# Patient Record
Sex: Male | Born: 1940
Health system: Southern US, Community
[De-identification: ages and names within clinical notes are randomized; demographics above are authoritative.]

## PROBLEM LIST (undated history)

## (undated) DIAGNOSIS — J45909 Unspecified asthma, uncomplicated: Secondary | ICD-10-CM

## (undated) DIAGNOSIS — I10 Essential (primary) hypertension: Secondary | ICD-10-CM

## (undated) DIAGNOSIS — E785 Hyperlipidemia, unspecified: Secondary | ICD-10-CM

## (undated) DIAGNOSIS — T7840XA Allergy, unspecified, initial encounter: Secondary | ICD-10-CM

## (undated) DIAGNOSIS — K219 Gastro-esophageal reflux disease without esophagitis: Secondary | ICD-10-CM

## (undated) DIAGNOSIS — C61 Malignant neoplasm of prostate: Secondary | ICD-10-CM

## (undated) HISTORY — DX: Essential (primary) hypertension: I10

## (undated) HISTORY — DX: Gastro-esophageal reflux disease without esophagitis: K21.9

## (undated) HISTORY — PX: HERNIA REPAIR: SHX51

## (undated) HISTORY — PX: EYE SURGERY: SHX253

## (undated) HISTORY — PX: PROSTATE BIOPSY: SHX241

## (undated) HISTORY — DX: Hyperlipidemia, unspecified: E78.5

## (undated) HISTORY — DX: Unspecified asthma, uncomplicated: J45.909

## (undated) HISTORY — DX: Allergy, unspecified, initial encounter: T78.40XA

---

## 2001-05-15 ENCOUNTER — Inpatient Hospital Stay (HOSPITAL_COMMUNITY): Admission: AD | Admit: 2001-05-15 | Discharge: 2001-05-17 | Payer: Self-pay | Admitting: Internal Medicine

## 2001-11-04 ENCOUNTER — Encounter: Admission: RE | Admit: 2001-11-04 | Discharge: 2001-11-04 | Payer: Self-pay | Admitting: General Surgery

## 2001-11-04 ENCOUNTER — Encounter: Payer: Self-pay | Admitting: General Surgery

## 2001-11-05 ENCOUNTER — Encounter: Admission: RE | Admit: 2001-11-05 | Discharge: 2001-11-05 | Payer: Self-pay | Admitting: General Surgery

## 2001-11-05 ENCOUNTER — Encounter: Payer: Self-pay | Admitting: General Surgery

## 2001-11-06 ENCOUNTER — Ambulatory Visit (HOSPITAL_BASED_OUTPATIENT_CLINIC_OR_DEPARTMENT_OTHER): Admission: RE | Admit: 2001-11-06 | Discharge: 2001-11-06 | Payer: Self-pay | Admitting: General Surgery

## 2012-01-10 DIAGNOSIS — Z23 Encounter for immunization: Secondary | ICD-10-CM | POA: Diagnosis not present

## 2012-02-14 DIAGNOSIS — B9789 Other viral agents as the cause of diseases classified elsewhere: Secondary | ICD-10-CM | POA: Diagnosis not present

## 2012-02-14 DIAGNOSIS — K8020 Calculus of gallbladder without cholecystitis without obstruction: Secondary | ICD-10-CM | POA: Diagnosis not present

## 2012-02-14 DIAGNOSIS — R918 Other nonspecific abnormal finding of lung field: Secondary | ICD-10-CM | POA: Diagnosis not present

## 2012-02-14 DIAGNOSIS — R51 Headache: Secondary | ICD-10-CM | POA: Diagnosis not present

## 2012-02-14 DIAGNOSIS — R509 Fever, unspecified: Secondary | ICD-10-CM | POA: Diagnosis not present

## 2012-02-14 DIAGNOSIS — J984 Other disorders of lung: Secondary | ICD-10-CM | POA: Diagnosis not present

## 2012-02-14 DIAGNOSIS — N200 Calculus of kidney: Secondary | ICD-10-CM | POA: Diagnosis not present

## 2012-02-14 DIAGNOSIS — R091 Pleurisy: Secondary | ICD-10-CM | POA: Diagnosis not present

## 2012-02-14 DIAGNOSIS — R05 Cough: Secondary | ICD-10-CM | POA: Diagnosis not present

## 2012-02-14 DIAGNOSIS — R059 Cough, unspecified: Secondary | ICD-10-CM | POA: Diagnosis not present

## 2012-06-16 DIAGNOSIS — H53019 Deprivation amblyopia, unspecified eye: Secondary | ICD-10-CM | POA: Diagnosis not present

## 2012-06-16 DIAGNOSIS — H2510 Age-related nuclear cataract, unspecified eye: Secondary | ICD-10-CM | POA: Diagnosis not present

## 2012-06-16 DIAGNOSIS — H524 Presbyopia: Secondary | ICD-10-CM | POA: Diagnosis not present

## 2012-07-27 DIAGNOSIS — H43819 Vitreous degeneration, unspecified eye: Secondary | ICD-10-CM | POA: Diagnosis not present

## 2012-10-22 DIAGNOSIS — D485 Neoplasm of uncertain behavior of skin: Secondary | ICD-10-CM | POA: Diagnosis not present

## 2012-10-22 DIAGNOSIS — L570 Actinic keratosis: Secondary | ICD-10-CM | POA: Diagnosis not present

## 2012-10-22 DIAGNOSIS — C44320 Squamous cell carcinoma of skin of unspecified parts of face: Secondary | ICD-10-CM | POA: Diagnosis not present

## 2013-02-23 DIAGNOSIS — Z85828 Personal history of other malignant neoplasm of skin: Secondary | ICD-10-CM | POA: Diagnosis not present

## 2013-02-23 DIAGNOSIS — L570 Actinic keratosis: Secondary | ICD-10-CM | POA: Diagnosis not present

## 2013-07-29 DIAGNOSIS — L219 Seborrheic dermatitis, unspecified: Secondary | ICD-10-CM | POA: Diagnosis not present

## 2013-07-29 DIAGNOSIS — L820 Inflamed seborrheic keratosis: Secondary | ICD-10-CM | POA: Diagnosis not present

## 2013-07-29 DIAGNOSIS — Z85828 Personal history of other malignant neoplasm of skin: Secondary | ICD-10-CM | POA: Diagnosis not present

## 2013-08-19 DIAGNOSIS — D485 Neoplasm of uncertain behavior of skin: Secondary | ICD-10-CM | POA: Diagnosis not present

## 2013-08-19 DIAGNOSIS — L570 Actinic keratosis: Secondary | ICD-10-CM | POA: Diagnosis not present

## 2013-10-27 DIAGNOSIS — Z23 Encounter for immunization: Secondary | ICD-10-CM | POA: Diagnosis not present

## 2014-04-28 DIAGNOSIS — Z85828 Personal history of other malignant neoplasm of skin: Secondary | ICD-10-CM | POA: Diagnosis not present

## 2014-04-28 DIAGNOSIS — L570 Actinic keratosis: Secondary | ICD-10-CM | POA: Diagnosis not present

## 2014-04-28 DIAGNOSIS — D485 Neoplasm of uncertain behavior of skin: Secondary | ICD-10-CM | POA: Diagnosis not present

## 2014-05-18 DIAGNOSIS — H3560 Retinal hemorrhage, unspecified eye: Secondary | ICD-10-CM | POA: Diagnosis not present

## 2014-05-18 DIAGNOSIS — H524 Presbyopia: Secondary | ICD-10-CM | POA: Diagnosis not present

## 2014-05-18 DIAGNOSIS — H53019 Deprivation amblyopia, unspecified eye: Secondary | ICD-10-CM | POA: Diagnosis not present

## 2014-05-18 DIAGNOSIS — H2510 Age-related nuclear cataract, unspecified eye: Secondary | ICD-10-CM | POA: Diagnosis not present

## 2014-09-29 DIAGNOSIS — L821 Other seborrheic keratosis: Secondary | ICD-10-CM | POA: Diagnosis not present

## 2014-09-29 DIAGNOSIS — L570 Actinic keratosis: Secondary | ICD-10-CM | POA: Diagnosis not present

## 2014-09-29 DIAGNOSIS — Z85828 Personal history of other malignant neoplasm of skin: Secondary | ICD-10-CM | POA: Diagnosis not present

## 2014-09-29 DIAGNOSIS — D1801 Hemangioma of skin and subcutaneous tissue: Secondary | ICD-10-CM | POA: Diagnosis not present

## 2014-09-29 DIAGNOSIS — Z08 Encounter for follow-up examination after completed treatment for malignant neoplasm: Secondary | ICD-10-CM | POA: Diagnosis not present

## 2014-10-12 DIAGNOSIS — Z23 Encounter for immunization: Secondary | ICD-10-CM | POA: Diagnosis not present

## 2014-11-02 ENCOUNTER — Ambulatory Visit: Payer: Self-pay | Admitting: Physician Assistant

## 2014-11-07 DIAGNOSIS — H2513 Age-related nuclear cataract, bilateral: Secondary | ICD-10-CM | POA: Diagnosis not present

## 2014-11-07 DIAGNOSIS — H34832 Tributary (branch) retinal vein occlusion, left eye: Secondary | ICD-10-CM | POA: Diagnosis not present

## 2014-11-08 ENCOUNTER — Ambulatory Visit (INDEPENDENT_AMBULATORY_CARE_PROVIDER_SITE_OTHER): Payer: Medicare Other | Admitting: Medical

## 2014-11-08 ENCOUNTER — Encounter: Payer: Self-pay | Admitting: Medical

## 2014-11-08 ENCOUNTER — Encounter: Payer: Self-pay | Admitting: Gastroenterology

## 2014-11-08 VITALS — BP 175/91 | HR 81 | Temp 98.1°F | Ht 67.5 in | Wt 193.6 lb

## 2014-11-08 DIAGNOSIS — J309 Allergic rhinitis, unspecified: Secondary | ICD-10-CM | POA: Insufficient documentation

## 2014-11-08 DIAGNOSIS — K219 Gastro-esophageal reflux disease without esophagitis: Secondary | ICD-10-CM

## 2014-11-08 DIAGNOSIS — J3089 Other allergic rhinitis: Secondary | ICD-10-CM

## 2014-11-08 DIAGNOSIS — Z1211 Encounter for screening for malignant neoplasm of colon: Secondary | ICD-10-CM | POA: Diagnosis not present

## 2014-11-08 DIAGNOSIS — I10 Essential (primary) hypertension: Secondary | ICD-10-CM | POA: Insufficient documentation

## 2014-11-08 DIAGNOSIS — J45909 Unspecified asthma, uncomplicated: Secondary | ICD-10-CM

## 2014-11-08 LAB — COMPREHENSIVE METABOLIC PANEL
ALT: 23 U/L (ref 0–53)
AST: 24 U/L (ref 0–37)
Albumin: 3.4 g/dL — ABNORMAL LOW (ref 3.5–5.2)
Alkaline Phosphatase: 51 U/L (ref 39–117)
BUN: 19 mg/dL (ref 6–23)
CO2: 24 mEq/L (ref 19–32)
Calcium: 9.2 mg/dL (ref 8.4–10.5)
Chloride: 102 mEq/L (ref 96–112)
Creatinine, Ser: 1 mg/dL (ref 0.4–1.5)
GFR: 76.52 mL/min (ref >60.00)
Glucose, Bld: 100 mg/dL — ABNORMAL HIGH (ref 70–99)
Potassium: 4.4 mEq/L (ref 3.5–5.1)
Sodium: 138 mEq/L (ref 135–145)
Total Bilirubin: 0.9 mg/dL (ref 0.2–1.2)
Total Protein: 7.5 g/dL (ref 6.0–8.3)

## 2014-11-08 MED ORDER — LISINOPRIL 10 MG PO TABS: 10.0000 mg | ORAL_TABLET | Freq: Every day | ORAL | Status: DC

## 2014-11-08 NOTE — Patient Instructions (Addendum)
For your htn, I am going to prescribe lisinopril tabs. Follow dash diet, reduce caffeine intake and get some exercise more frequently.  Please get lab today.  Follow up in 2 weeks and come in fasting. Will get lipid panel on follow up.  Pt has had flu-vaccine this year.  Will refer you to GI for colonoscopy.  You could make wellness exam physical in 2-3 months as well.   DASH Eating Plan DASH stands for "Dietary Approaches to Stop Hypertension." The DASH eating plan is a healthy eating plan that has been shown to reduce high blood pressure (hypertension). Additional health benefits may include reducing the risk of type 2 diabetes mellitus, heart disease, and stroke. The DASH eating plan may also help with weight loss. WHAT DO I NEED TO KNOW ABOUT THE DASH EATING PLAN? For the DASH eating plan, you will follow these general guidelines:  Choose foods with a percent daily value for sodium of less than 5% (as listed on the food label).  Use salt-free seasonings or herbs instead of table salt or sea salt.  Check with your health care provider or pharmacist before using salt substitutes.  Eat lower-sodium products, often labeled as "lower sodium" or "no salt added."  Eat fresh foods.  Eat more vegetables, fruits, and low-fat dairy products.  Choose whole grains. Look for the word "whole" as the first word in the ingredient list.  Choose fish and skinless chicken or [place] more often than red meat. Limit fish, poultry, and meat to 6 oz (170 g) each day.  Limit sweets, desserts, sugars, and sugary drinks.  Choose heart-healthy fats.  Limit cheese to 1 oz (28 g) per day.  Eat more home-cooked food and less restaurant, buffet, and fast food.  Limit fried foods.  Cook foods using methods other than frying.  Limit canned vegetables. If you do use them, rinse them well to decrease the sodium.  When eating at a restaurant, ask that your food be prepared with less salt, or no salt if  possible. WHAT FOODS CAN I EAT? Seek help from a dietitian for individual calorie needs. Grains Whole grain or whole wheat bread. Brown rice. Whole grain or whole wheat pasta. Quinoa, bulgur, and whole grain cereals. Low-sodium cereals. Corn or whole wheat flour tortillas. Whole grain cornbread. Whole grain crackers. Low-sodium crackers. Vegetables Fresh or frozen vegetables (raw, steamed, roasted, or grilled). Low-sodium or reduced-sodium tomato and vegetable juices. Low-sodium or reduced-sodium tomato sauce and paste. Low-sodium or reduced-sodium canned vegetables.  Fruits All fresh, canned (in natural juice), or frozen fruits. Meat and Other Protein Products Ground beef (85% or leaner), grass-fed beef, or beef trimmed of fat. Skinless chicken or [place]. Ground chicken or [place]. Pork trimmed of fat. All fish and seafood. Eggs. Dried beans, peas, or lentils. Unsalted nuts and seeds. Unsalted canned beans. Dairy Low-fat dairy products, such as skim or 1% milk, 2% or reduced-fat cheeses, low-fat ricotta or cottage cheese, or plain low-fat yogurt. Low-sodium or reduced-sodium cheeses. Fats and Oils Tub margarines without trans fats. Light or reduced-fat mayonnaise and salad dressings (reduced sodium). Avocado. Safflower, olive, or canola oils. Natural peanut or almond butter. Other Unsalted popcorn and pretzels. The items listed above may not be a complete list of recommended foods or beverages. Contact your dietitian for more options. WHAT FOODS ARE NOT RECOMMENDED? Grains White bread. White pasta. White rice. Refined cornbread. Bagels and croissants. Crackers that contain trans fat. Vegetables Creamed or fried vegetables. Vegetables in a cheese sauce. Regular  canned vegetables. Regular canned tomato sauce and paste. Regular tomato and vegetable juices. Fruits Dried fruits. Canned fruit in light or heavy syrup. Fruit juice. Meat and Other Protein Products Fatty cuts of meat. Ribs, chicken  wings, bacon, sausage, bologna, salami, chitterlings, fatback, hot dogs, bratwurst, and packaged luncheon meats. Salted nuts and seeds. Canned beans with salt. Dairy Whole or 2% milk, cream, half-and-half, and cream cheese. Whole-fat or sweetened yogurt. Full-fat cheeses or blue cheese. Nondairy creamers and whipped toppings. Processed cheese, cheese spreads, or cheese curds. Condiments Onion and garlic salt, seasoned salt, table salt, and sea salt. Canned and packaged gravies. Worcestershire sauce. Tartar sauce. Barbecue sauce. Teriyaki sauce. Soy sauce, including reduced sodium. Steak sauce. Fish sauce. Oyster sauce. Cocktail sauce. Horseradish. Ketchup and mustard. Meat flavorings and tenderizers. Bouillon cubes. Hot sauce. Tabasco sauce. Marinades. Taco seasonings. Relishes. Fats and Oils Butter, stick margarine, lard, shortening, ghee, and bacon fat. Coconut, palm kernel, or palm oils. Regular salad dressings. Other Pickles and olives. Salted popcorn and pretzels. The items listed above may not be a complete list of foods and beverages to avoid. Contact your dietitian for more information. WHERE CAN I FIND MORE INFORMATION? National Heart, Lung, and Blood Institute: travelstabloid.com Document Released: 12/05/2011 Document Revised: 05/02/2014 Document Reviewed: 10/20/2013 [facility] Patient Information 2015 [place], [place]. This information is not intended to replace advice given to you by your health care provider. Make sure you discuss any questions you have with your health care provider.

## 2014-11-08 NOTE — Assessment & Plan Note (Signed)
Pt has allergies in past. Currently stable. Worse when he was younger. Pt state no asthma. Last flare was in lat 80's. Associated with possible cat hair

## 2014-11-08 NOTE — Progress Notes (Signed)
Subjective:    Patient ID: Donald Porter, male    DOB: 12-Jul-1941, 73 y.o.   MRN: [id number]  HPI   Pt states no MD for 10 years. I reviewed his pmh, psh, fh, surgical hx and social hx.  Pt retired Engineer, maintenance ([employer]), exercises/walks 2-3 times a week, coffee in am 2-3 cups a day. Tea, Married- 1 child.   Pt has allergies in past. Currently stable. Worse when he was younger. Pt state no asthma. Last flare was in lat 80's. Associated with possible cat hair allergies.  Pt has gerd history. He takes [employer] equate version of pepcid. Never severe or chronic enough that endoscopies.  Pt bp yesterday at eye MD yesterday. Eye doctor noted hypertensive findings on exam. Pt has no cardiac or neurologic symptoms. Ten years ago when he was last seeing a provider he did not have any hypertension.   Pt states last colonosocpy was about 10 yrs ago. Some polyps. Pt has not had repeat.  Pt never had psa done.  Past Medical History  Diagnosis Date  . Allergy   . Asthma     1988  . GERD (gastroesophageal reflux disease)   . Hypertension     History   Social History  . Marital Status: Married    Spouse Name: N/A    Number of Children: N/A  . Years of Education: N/A   Occupational History  . Not on file.   Social History Main Topics  . Smoking status: Never Smoker   . Smokeless tobacco: Never Used  . Alcohol Use: 0.0 oz/week    0 Not specified per week  . Drug Use: Not on file  . Sexual Activity: Not on file   Other Topics Concern  . Not on file   Social History Narrative  . No narrative on file    Past Surgical History  Procedure Laterality Date  . Hernia repair      3 surgeries  . Eye surgery      Age 54    Family History  Problem Relation Age of Onset  . Heart disease Mother   . Lymphoma Father   . Cancer Father     No Known Allergies  No current outpatient prescriptions on file prior to visit.   No current facility-administered medications on file prior to visit.    BP  175/91 mmHg  Pulse 81  Temp(Src) 98.1 F (36.7 C) (Oral)  Ht 5' 7.5" (1.715 m)  Wt 193 lb 9.6 oz (87.816 kg)  BMI 29.86 kg/m2  SpO2 96%          Review of Systems  Constitutional: Negative for fever, chills and fatigue.  HENT: Negative.   Respiratory: Negative for cough, chest tightness, shortness of breath and wheezing.   Cardiovascular: Negative for chest pain and palpitations.  Genitourinary: Negative.   Musculoskeletal: Negative for myalgias, back pain, joint swelling and neck stiffness.  Skin: Negative.   Neurological: Negative for dizziness, tremors, seizures, syncope, facial asymmetry, speech difficulty, weakness, light-headedness, numbness and headaches.  Hematological: Negative for adenopathy. Does not bruise/bleed easily.       Objective:   Physical Exam  General Mental Status- Alert. General Appearance- Not in acute distress.   Skin General: Color- Normal Color. Moisture- Normal Moisture.  Neck Carotid Arteries- Normal color. Moisture- Normal Moisture. No carotid bruits. No JVD.  Chest and Lung Exam Auscultation: Breath Sounds:-Normal. CTA.  Cardiovascular Auscultation:Rythm- Regular. Rate and rhythm. Murmurs & Other Heart Sounds:Auscultation of the heart reveals-  No Murmurs.  Abdomen Inspection:-Inspeection Normal. Palpation/Percussion:Note:No mass. Palpation and Percussion of the abdomen reveal- Non Tender, Non Distended + BS, no rebound or guarding.    Neurologic Cranial Nerve exam:- CN III-XII intact(No nystagmus), symmetric smile. Drift Test:- No drift. Romberg Exam:- Negative.  Finger to Nose:- Normal/Intact Strength:- 5/5 equal and symmetric strength both upper and lower extremities.       Assessment & Plan:

## 2014-11-08 NOTE — Assessment & Plan Note (Signed)
For the htn, I am going to prescribe lisinopril tabs. Follow dash diet, reduce caffeine intake and get some exercise more frequently. Recheck bp in 2 wks. Get cmp today.

## 2014-11-08 NOTE — Assessment & Plan Note (Signed)
Pt has allergies in past. Currently stable. Worse when he was younger. Pt state no asthma. Last flare was in lat 80's. Associated with possible cat hair allergies

## 2014-11-08 NOTE — Progress Notes (Signed)
Pre visit review using our clinic review tool, if applicable. No additional management support is needed unless otherwise documented below in the visit note. 

## 2014-11-22 ENCOUNTER — Ambulatory Visit (INDEPENDENT_AMBULATORY_CARE_PROVIDER_SITE_OTHER): Payer: Medicare Other | Admitting: Medical

## 2014-11-22 ENCOUNTER — Encounter: Payer: Self-pay | Admitting: Medical

## 2014-11-22 VITALS — BP 160/80 | HR 77 | Temp 98.2°F | Ht 67.2 in | Wt 188.4 lb

## 2014-11-22 DIAGNOSIS — I10 Essential (primary) hypertension: Secondary | ICD-10-CM

## 2014-11-22 DIAGNOSIS — J069 Acute upper respiratory infection, unspecified: Secondary | ICD-10-CM

## 2014-11-22 LAB — COMPREHENSIVE METABOLIC PANEL
ALT: 21 U/L (ref 0–53)
AST: 23 U/L (ref 0–37)
Albumin: 4 g/dL (ref 3.5–5.2)
Alkaline Phosphatase: 59 U/L (ref 39–117)
BUN: 18 mg/dL (ref 6–23)
CO2: 27 mEq/L (ref 19–32)
Calcium: 9.3 mg/dL (ref 8.4–10.5)
Chloride: 97 mEq/L (ref 96–112)
Creatinine, Ser: 1.1 mg/dL (ref 0.4–1.5)
GFR: 67.91 mL/min (ref >60.00)
Glucose, Bld: 78 mg/dL (ref 70–99)
Potassium: 4.2 mEq/L (ref 3.5–5.1)
Sodium: 137 mEq/L (ref 135–145)
Total Bilirubin: 0.8 mg/dL (ref 0.2–1.2)
Total Protein: 7.8 g/dL (ref 6.0–8.3)

## 2014-11-22 LAB — LIPID PANEL
Cholesterol: 270 mg/dL — ABNORMAL HIGH (ref 0–200)
HDL: 59.6 mg/dL (ref >39.00)
LDL Cholesterol: 188 mg/dL — ABNORMAL HIGH (ref 0–99)
NonHDL: 210.4
Total CHOL/HDL Ratio: 5
Triglycerides: 112 mg/dL (ref 0.0–149.0)
VLDL: 22.4 mg/dL (ref 0.0–40.0)

## 2014-11-22 NOTE — Assessment & Plan Note (Signed)
Take your bp medication daily. Check bp daily with your machine and update [place] on reading in 2wks. If not coming down call us before then. In 2 wks when you notify us of your reading we can refill your medication at appropiate dose.  Note pt bp reading this am done without  Bo medication this am. He fasted and did not take his medication thinking it would effect his bp. I advised next time in fasting labs take his meds.

## 2014-11-22 NOTE — Progress Notes (Signed)
Subjective:    Patient ID: Donald Porter, male    DOB: 01-Dec-1941, 73 y.o.   MRN: [id number]  HPI   Pt in for follow up on his hypertension. He did not take his medication this am. He has not been checking his blood pressure. He has not chest pain or neurologic signs or symptoms. Pt has wrist bp cuff at home. No side effects from the medication.  Pt in with mild sorethroat. It started 2 nights ago. He felt a lot of pnd. Faint sinus pressure. No fever, no chills or bodyaches.  Past Medical History  Diagnosis Date  . Allergy   . Asthma     1988  . GERD (gastroesophageal reflux disease)   . Hypertension     History   Social History  . Marital Status: Married    Spouse Name: N/A    Number of Children: N/A  . Years of Education: N/A   Occupational History  . Not on file.   Social History Main Topics  . Smoking status: Never Smoker   . Smokeless tobacco: Never Used  . Alcohol Use: 0.0 oz/week    0 Not specified per week  . Drug Use: Not on file  . Sexual Activity: Not on file   Other Topics Concern  . Not on file   Social History Narrative    Past Surgical History  Procedure Laterality Date  . Hernia repair      3 surgeries  . Eye surgery      Age 48    Family History  Problem Relation Age of Onset  . Heart disease Mother   . Lymphoma Father   . Cancer Father     No Known Allergies  Current Outpatient Prescriptions on File Prior to Visit  Medication Sig Dispense Refill  . lisinopril (PRINIVIL,ZESTRIL) 10 MG tablet Take 1 tablet (10 mg total) by mouth daily. (Patient not taking: Reported on 11/22/2014) 30 tablet 0   No current facility-administered medications on file prior to visit.    BP 160/80 mmHg  Pulse 77  Temp(Src) 98.2 F (36.8 C) (Oral)  Ht 5' 7.2" (1.707 m)  Wt 188 lb 6.4 oz (85.458 kg)  BMI 29.33 kg/m2  SpO2 96%     Review of Systems  Constitutional: Negative for fever, chills and fatigue.  HENT: Positive for sinus pressure and  sore throat. Negative for congestion, ear discharge, ear pain, nosebleeds, postnasal drip, rhinorrhea, sneezing and trouble swallowing.        Very faint minimal sore throat and faint sinus pressure.  Respiratory: Negative for cough, chest tightness, shortness of breath and wheezing.   Cardiovascular: Negative for chest pain and palpitations.  Gastrointestinal: Negative for nausea, abdominal pain, diarrhea and rectal pain.  Musculoskeletal: Negative for back pain.  Neurological: Negative for dizziness, tremors, seizures, syncope, facial asymmetry, weakness, light-headedness, numbness and headaches.  Hematological: Negative for adenopathy. Does not bruise/bleed easily.  Psychiatric/Behavioral: Negative for suicidal ideas, behavioral problems, self-injury and dysphoric mood. The patient is not nervous/anxious.        Objective:   Physical Exam   General Mental Status- Alert. General Appearance- Not in acute distress.   Skin General: Color- Normal Color. Moisture- Normal Moisture.  Neck Carotid Arteries- Normal color. Moisture- Normal Moisture. No carotid bruits. No JVD.  Chest and Lung Exam Auscultation: Breath Sounds:-Normal.  Cardiovascular Auscultation:Rythm- Regular. Murmurs & Other Heart Sounds:Auscultation of the heart reveals- No Murmurs.  Abdomen Inspection:-Inspeection Normal. Palpation/Percussion:Note:No mass. Palpation and Percussion of  the abdomen reveal- Non Tender, Non Distended + BS, no rebound or guarding.    Neurologic Cranial Nerve exam:- CN III-XII intact(No nystagmus), symmetric smile. Drift Test:- No drift. Romberg Exam:- Negative.  Finger to Nose:- Normal/Intact Strength:- 5/5 equal and symmetric strength both upper and lower extremities.  Heent- boggy turbinates.  Mild post nasal drainage.No sinus pressure. No tonsillar hypertrophy. No bright redness. No discharge. Submandibular nodes not swollen.        Assessment & Plan:

## 2014-11-22 NOTE — Progress Notes (Signed)
Pre visit review using our clinic review tool, if applicable. No additional management support is needed unless otherwise documented below in the visit note. 

## 2014-11-22 NOTE — Patient Instructions (Addendum)
Take your bp medication daily. Check bp daily with your machine and update [place] on reading in 2wks. If not coming down call us before then. In 2 wks when you notify us of your reading we can refill your medication at appropiate dose.  For your sore throat and nasal congestion, I think this is viral/uri vs allergic rhinitis(with pnd causing Mild sore throat). Rest, hydrate, flonase otc, and warm salt water gargles. If symptoms worsen notify us. By Monday if worsened can give antibiotic but not indicated presently.  Please get fasting labs today.  Follow up in 2 months or as needed.

## 2014-11-22 NOTE — Assessment & Plan Note (Signed)
Vs allergic rhintis. For your sore throat and nasal congestion, I think this is viral/uri vs allergic rhinitis(with pnd causing Mild sore throat). Rest, hydrate, flonase otc, and warm salt water gargles. If symptoms worsen notify us. By Monday if worsened can give antibiotic but not indicated presently.

## 2014-11-24 ENCOUNTER — Telehealth: Payer: Self-pay | Admitting: Medical

## 2014-11-24 MED ORDER — SIMVASTATIN 20 MG PO TABS: 20.0000 mg | ORAL_TABLET | Freq: Every day | ORAL | Status: DC

## 2014-11-24 NOTE — Telephone Encounter (Signed)
Sending in prescription of simvastatin to his pharmacy for hyperlipidemia.

## 2014-11-26 ENCOUNTER — Emergency Department (HOSPITAL_BASED_OUTPATIENT_CLINIC_OR_DEPARTMENT_OTHER): Payer: Medicare Other

## 2014-11-26 ENCOUNTER — Encounter (HOSPITAL_BASED_OUTPATIENT_CLINIC_OR_DEPARTMENT_OTHER): Payer: Self-pay

## 2014-11-26 ENCOUNTER — Emergency Department (HOSPITAL_BASED_OUTPATIENT_CLINIC_OR_DEPARTMENT_OTHER)
Admission: EM | Admit: 2014-11-26 | Discharge: 2014-11-26 | Disposition: A | Discharge status: Home / Self Care | Payer: Medicare Other | Attending: Emergency Medicine | Admitting: Emergency Medicine

## 2014-11-26 DIAGNOSIS — J40 Bronchitis, not specified as acute or chronic: Secondary | ICD-10-CM

## 2014-11-26 DIAGNOSIS — Z8719 Personal history of other diseases of the digestive system: Secondary | ICD-10-CM | POA: Insufficient documentation

## 2014-11-26 DIAGNOSIS — I10 Essential (primary) hypertension: Secondary | ICD-10-CM | POA: Diagnosis not present

## 2014-11-26 DIAGNOSIS — J029 Acute pharyngitis, unspecified: Secondary | ICD-10-CM | POA: Diagnosis present

## 2014-11-26 DIAGNOSIS — R911 Solitary pulmonary nodule: Secondary | ICD-10-CM | POA: Diagnosis not present

## 2014-11-26 DIAGNOSIS — R918 Other nonspecific abnormal finding of lung field: Secondary | ICD-10-CM | POA: Diagnosis not present

## 2014-11-26 DIAGNOSIS — J45909 Unspecified asthma, uncomplicated: Secondary | ICD-10-CM | POA: Diagnosis not present

## 2014-11-26 DIAGNOSIS — H9209 Otalgia, unspecified ear: Secondary | ICD-10-CM | POA: Diagnosis not present

## 2014-11-26 DIAGNOSIS — H6123 Impacted cerumen, bilateral: Secondary | ICD-10-CM | POA: Diagnosis not present

## 2014-11-26 DIAGNOSIS — R05 Cough: Secondary | ICD-10-CM | POA: Diagnosis not present

## 2014-11-26 MED ORDER — DOXYCYCLINE HYCLATE 100 MG PO CAPS: 100.0000 mg | ORAL_CAPSULE | Freq: Two times a day (BID) | ORAL | Status: DC

## 2014-11-26 MED ORDER — DOCUSATE SODIUM 50 MG/5ML PO LIQD
ORAL | Status: AC
  Administered 2014-11-26: 100 mg
  Filled 2014-11-26: qty 10

## 2014-11-26 NOTE — ED Provider Notes (Signed)
CSN: [id number]     Arrival date & time 11/26/14  1017 History   First MD Initiated Contact with Patient 11/26/14 1115     Chief Complaint  Patient presents with  . Sore Throat     (Consider location/radiation/quality/duration/timing/severity/associated sxs/prior Treatment) HPI Comments: Pt with 5 day hx of worsening URI symptoms. He's had cough and nasal congestion and sore throat. He denies any shortness of breath. There is no nausea or vomiting. He's had subjective fevers and chills. His symptoms are worse through the night it seemed to improve a little bit through the day. He's had no improvement over the last 4-5 days. He's not been using any over-the-counter medications.  Patient is a 73 y.o. male presenting with pharyngitis.  Sore Throat Pertinent negatives include no chest pain, no abdominal pain, no headaches and no shortness of breath.    Past Medical History  Diagnosis Date  . Allergy   . Asthma     1988  . GERD (gastroesophageal reflux disease)   . Hypertension    Past Surgical History  Procedure Laterality Date  . Hernia repair      3 surgeries  . Eye surgery      Age 61   Family History  Problem Relation Age of Onset  . Heart disease Mother   . Lymphoma Father   . Cancer Father    History  Substance Use Topics  . Smoking status: Never Smoker   . Smokeless tobacco: Never Used  . Alcohol Use: 0.0 oz/week    0 Not specified per week    Review of Systems  Constitutional: Negative for fever, chills, diaphoresis and fatigue.  HENT: Positive for congestion, ear pain, postnasal drip, rhinorrhea and sore throat. Negative for sneezing.   Eyes: Negative.   Respiratory: Positive for cough. Negative for chest tightness and shortness of breath.   Cardiovascular: Negative for chest pain and leg swelling.  Gastrointestinal: Negative for nausea, vomiting, abdominal pain, diarrhea and blood in stool.  Genitourinary: Negative for frequency, hematuria, flank pain and  difficulty urinating.  Musculoskeletal: Negative for back pain and arthralgias.  Skin: Negative for rash.  Neurological: Negative for dizziness, speech difficulty, weakness, numbness and headaches.      Allergies  Review of patient's allergies indicates no known allergies.  Home Medications   Prior to Admission medications   Medication Sig Start Date End Date Taking? Authorizing Provider  doxycycline (VIBRAMYCIN) 100 MG capsule Take 1 capsule (100 mg total) by mouth 2 (two) times daily. One po bid x 7 days 11/26/14   Malvin Johns, MD  lisinopril (PRINIVIL,ZESTRIL) 10 MG tablet Take 1 tablet (10 mg total) by mouth daily. Patient not taking: Reported on 11/22/2014 11/08/14   Meriam Sprague [name], PA-C  simvastatin (ZOCOR) 20 MG tablet Take 1 tablet (20 mg total) by mouth at bedtime. 11/24/14   Meriam Sprague [name], PA-C   BP 143/76 mmHg  Pulse 81  Temp(Src) 99 F (37.2 C)  Resp 18  Wt 188 lb (85.276 kg)  SpO2 97% Physical Exam  Constitutional: He is oriented to person, place, and time. He appears well-developed and well-nourished.  HENT:  Head: Normocephalic and atraumatic.  Mouth/Throat: Oropharynx is clear and moist.  Bilateral cerumen impaction  Eyes: Pupils are equal, round, and reactive to light.  Neck: Normal range of motion. Neck supple.  Cardiovascular: Normal rate, regular rhythm and normal heart sounds.   Pulmonary/Chest: Effort normal and breath sounds normal. No respiratory distress. He has no wheezes. He has  no rales. He exhibits no tenderness.  Abdominal: Soft. Bowel sounds are normal. There is no tenderness. There is no rebound and no guarding.  Musculoskeletal: Normal range of motion. He exhibits no edema.  Lymphadenopathy:    He has no cervical adenopathy.  Neurological: He is alert and oriented to person, place, and time.  Skin: Skin is warm and dry. No rash noted.  Psychiatric: He has a normal mood and affect.    ED Course  Procedures (including critical care  time) Labs Review Labs Reviewed - No data to display  Imaging Review Dg Chest 2 View  11/26/2014   CLINICAL DATA:  Initial evaluation for sore throat cough fever 5 days  EXAM: CHEST  2 VIEW  COMPARISON:  None.  FINDINGS: Heart size and vascular pattern are normal. There are bilateral geometric radiodense opacities measuring 1-2 cm. These appear most consistent with calcified pleural plaques. There is otherwise no evidence of abnormal opacity to suggest infiltrate or consolidation, although there is a nodular noncalcified 1 cm opacity laterally in the left midlung zone. There are no pleural effusions.  IMPRESSION: Abnormal bilateral opacities suggest the presence of pleural plaques. There is a noncalcified 1 cm nodular opacity laterally in the left mid lung zone. The thorax to further evaluate.   Electronically Signed   By: Skipper Cliche M.D.   On: 11/26/2014 12:24     EKG Interpretation None      MDM   Final diagnoses:  Bronchitis    Patient is started on doxycycline given his ongoing symptoms. His ears were irrigated and the ED with improvement of symptoms. He was advised to follow-up with his primary care physician return here as needed for any worsening symptoms. He was advised that he has a pulmonary nodule that will need outpatient follow-up by his primary care physician.    Malvin Johns, MD 11/26/14 [phone]

## 2014-11-26 NOTE — ED Notes (Signed)
Patient here with 3 days of cough, fever, congestion, and sore throat x 3 days. No distress on assessment

## 2014-11-26 NOTE — Discharge Instructions (Signed)
Upper Respiratory Infection, Adult An upper respiratory infection (URI) is also sometimes known as the common cold. The upper respiratory tract includes the nose, sinuses, throat, trachea, and bronchi. Bronchi are the airways leading to the lungs. Most people improve within 1 week, but symptoms can last up to 2 weeks. A residual cough may last even longer.  CAUSES Many different viruses can infect the tissues lining the upper respiratory tract. The tissues become irritated and inflamed and often become very moist. Mucus production is also common. A cold is contagious. You can easily spread the virus to others by oral contact. This includes kissing, sharing a glass, coughing, or sneezing. Touching your mouth or nose and then touching a surface, which is then touched by another person, can also spread the virus. SYMPTOMS  Symptoms typically develop 1 to 3 days after you come in contact with a cold virus. Symptoms vary from person to person. They may include:  Runny nose.  Sneezing.  Nasal congestion.  Sinus irritation.  Sore throat.  Loss of voice (laryngitis).  Cough.  Fatigue.  Muscle aches.  Loss of appetite.  Headache.  Low-grade fever. DIAGNOSIS  You might diagnose your own cold based on familiar symptoms, since most people get a cold 2 to 3 times a year. Your caregiver can confirm this based on your exam. Most importantly, your caregiver can check that your symptoms are not due to another disease such as strep throat, sinusitis, pneumonia, asthma, or epiglottitis. Blood tests, throat tests, and X-rays are not necessary to diagnose a common cold, but they may sometimes be helpful in excluding other more serious diseases. Your caregiver will decide if any further tests are required. RISKS AND COMPLICATIONS  You may be at risk for a more severe case of the common cold if you smoke cigarettes, have chronic heart disease (such as heart failure) or lung disease (such as asthma), or if  you have a weakened immune system. The very young and very old are also at risk for more serious infections. Bacterial sinusitis, middle ear infections, and bacterial pneumonia can complicate the common cold. The common cold can worsen asthma and chronic obstructive pulmonary disease (COPD). Sometimes, these complications can require emergency medical care and may be life-threatening. PREVENTION  The best way to protect against getting a cold is to practice good hygiene. Avoid oral or hand contact with people with cold symptoms. Wash your hands often if contact occurs. There is no clear evidence that vitamin C, vitamin E, echinacea, or exercise reduces the chance of developing a cold. However, it is always recommended to get plenty of rest and practice good nutrition. TREATMENT  Treatment is directed at relieving symptoms. There is no cure. Antibiotics are not effective, because the infection is caused by a virus, not by bacteria. Treatment may include:  Increased fluid intake. Sports drinks offer valuable electrolytes, sugars, and fluids.  Breathing heated mist or steam (vaporizer or shower).  Eating chicken soup or other clear broths, and maintaining good nutrition.  Getting plenty of rest.  Using gargles or lozenges for comfort.  Controlling fevers with ibuprofen or acetaminophen as directed by your caregiver.  Increasing usage of your inhaler if you have asthma. Zinc gel and zinc lozenges, taken in the first 24 hours of the common cold, can shorten the duration and lessen the severity of symptoms. Pain medicines may help with fever, muscle aches, and throat pain. A variety of non-prescription medicines are available to treat congestion and runny nose. Your caregiver  can make recommendations and may suggest nasal or lung inhalers for other symptoms.  HOME CARE INSTRUCTIONS   Only take over-the-counter or prescription medicines for pain, discomfort, or fever as directed by your  caregiver.  Use a warm mist humidifier or inhale steam from a shower to increase air moisture. This may keep secretions moist and make it easier to breathe.  Drink enough water and fluids to keep your urine clear or pale yellow.  Rest as needed.  Return to work when your temperature has returned to normal or as your caregiver advises. You may need to stay home longer to avoid infecting others. You can also use a face mask and careful hand washing to prevent spread of the virus. SEEK MEDICAL CARE IF:   After the first few days, you feel you are getting worse rather than better.  You need your caregiver's advice about medicines to control symptoms.  You develop chills, worsening shortness of breath, or brown or red sputum. These may be signs of pneumonia.  You develop yellow or brown nasal discharge or pain in the face, especially when you bend forward. These may be signs of sinusitis.  You develop a fever, swollen neck glands, pain with swallowing, or white areas in the back of your throat. These may be signs of strep throat. SEEK IMMEDIATE MEDICAL CARE IF:   You have a fever.  You develop severe or persistent headache, ear pain, sinus pain, or chest pain.  You develop wheezing, a prolonged cough, cough up blood, or have a change in your usual mucus (if you have chronic lung disease).  You develop sore muscles or a stiff neck. Document Released: 06/11/2001 Document Revised: 03/09/2012 Document Reviewed: 03/23/2014 [facility] Patient Information 2015 [place], [place]. This information is not intended to replace advice given to you by your health care provider. Make sure you discuss any questions you have with your health care provider.  Pulmonary Nodule A pulmonary nodule is a small, round growth of tissue in the lung. Pulmonary nodules can range in size from less than 1/5 inch (4 mm) to a little bigger than an inch (25 mm). Most pulmonary nodules are detected when imaging tests of the  lung are being performed for a different problem. Pulmonary nodules are usually not cancerous (benign). However, some pulmonary nodules are cancerous (malignant). Follow-up treatment or testing is based on the size of the pulmonary nodule and your risk of getting lung cancer.  CAUSES Benign pulmonary nodules can be caused by various things. Some of the causes include:   Bacterial, fungal, or viral infections. This is usually an old infection that is no longer active, but it can sometimes be a current, active infection.  A benign mass of tissue.  Inflammation from conditions such as rheumatoid arthritis.   Abnormal blood vessels in the lungs. Malignant pulmonary nodules can result from lung cancer or from cancers that spread to the lung from other places in the body. SIGNS AND SYMPTOMS Pulmonary nodules usually do not cause symptoms. DIAGNOSIS Most often, pulmonary nodules are found incidentally when an X-ray or CT scan is performed to look for some other problem in the lung area. To help determine whether a pulmonary nodule is benign or malignant, your health care provider will take a medical history and order a variety of tests. Tests done may include:   Blood tests.  A skin test called a tuberculin test. This test is used to determine if you have been exposed to the germ that causes  tuberculosis.   Chest X-rays. If possible, a new X-ray may be compared with X-rays you have had in the past.   CT scan. This test shows smaller pulmonary nodules more clearly than an X-ray.   Positron emission tomography (PET) scan. In this test, a safe amount of a radioactive substance is injected into the bloodstream. Then, the scan takes a picture of the pulmonary nodule. The radioactive substance is eliminated from your body in your urine.   Biopsy. A tiny piece of the pulmonary nodule is removed so it can be checked under a microscope. TREATMENT  Pulmonary nodules that are benign normally do not  require any treatment because they usually do not cause symptoms or breathing problems. Your health care provider may want to monitor the pulmonary nodule through follow-up CT scans. The frequency of these CT scans will vary based on the size of the nodule and the risk factors for lung cancer. For example, CT scans will need to be done more frequently if the pulmonary nodule is larger and if you have a history of smoking and a family history of cancer. Further testing or biopsies may be done if any follow-up CT scan shows that the size of the pulmonary nodule has increased. HOME CARE INSTRUCTIONS  Only take over-the-counter or prescription medicines as directed by your health care provider.  Keep all follow-up appointments with your health care provider. SEEK MEDICAL CARE IF:  You have trouble breathing when you are active.   You feel sick or unusually tired.   You do not feel like eating.   You lose weight without trying to.   You develop chills or night sweats.  SEEK IMMEDIATE MEDICAL CARE IF:  You cannot catch your breath, or you begin wheezing.   You cannot stop coughing.   You cough up blood.   You become dizzy or feel like you are going to pass out.   You have sudden chest pain.   You have a fever or persistent symptoms for more than 2-3 days.   You have a fever and your symptoms suddenly get worse. MAKE SURE YOU:  Understand these instructions.  Will watch your condition.  Will get help right away if you are not doing well or get worse. Document Released: 10/13/2009 Document Revised: 08/18/2013 Document Reviewed: 06/07/2013 [facility] Patient Information 2015 [place], [place]. This information is not intended to replace advice given to you by your health care provider. Make sure you discuss any questions you have with your health care provider.

## 2014-12-02 ENCOUNTER — Other Ambulatory Visit: Payer: Self-pay

## 2014-12-02 MED ORDER — LISINOPRIL 10 MG PO TABS: 10.0000 mg | ORAL_TABLET | Freq: Every day | ORAL | Status: DC

## 2014-12-05 ENCOUNTER — Other Ambulatory Visit: Payer: Self-pay

## 2014-12-05 MED ORDER — LISINOPRIL 10 MG PO TABS: 10.0000 mg | ORAL_TABLET | Freq: Every day | ORAL | Status: DC

## 2014-12-19 DIAGNOSIS — H34832 Tributary (branch) retinal vein occlusion, left eye: Secondary | ICD-10-CM | POA: Diagnosis not present

## 2014-12-19 DIAGNOSIS — H2513 Age-related nuclear cataract, bilateral: Secondary | ICD-10-CM | POA: Diagnosis not present

## 2014-12-19 DIAGNOSIS — H35372 Puckering of macula, left eye: Secondary | ICD-10-CM | POA: Diagnosis not present

## 2015-01-19 ENCOUNTER — Encounter: Payer: PRIVATE HEALTH INSURANCE | Admitting: Gastroenterology

## 2015-02-08 ENCOUNTER — Other Ambulatory Visit: Payer: Self-pay | Admitting: Medical

## 2015-03-07 ENCOUNTER — Other Ambulatory Visit: Payer: Self-pay | Admitting: Medical

## 2015-04-02 ENCOUNTER — Other Ambulatory Visit: Payer: Self-pay | Admitting: Medical

## 2015-04-03 NOTE — Telephone Encounter (Signed)
Refill of his bp med. And since not in for some time get lpn to ask pt to come in for bp check.

## 2015-04-03 NOTE — Telephone Encounter (Signed)
Called patient left message on answering machine and advised to call be so we could schedule appointment for follow up.

## 2015-04-04 ENCOUNTER — Other Ambulatory Visit: Payer: Self-pay

## 2015-04-04 MED ORDER — SIMVASTATIN 20 MG PO TABS: 20.0000 mg | ORAL_TABLET | Freq: Every day | ORAL | Status: DC

## 2015-04-04 MED ORDER — LISINOPRIL 10 MG PO TABS: 10.0000 mg | ORAL_TABLET | Freq: Every day | ORAL | Status: DC

## 2015-04-04 NOTE — Telephone Encounter (Signed)
Pt is overdue for bp check and lipid panel check. He needs to come in before 30 days refills are up. So ask lpn to notify pt.

## 2015-04-05 NOTE — Telephone Encounter (Signed)
Left detailed message on patients answering machine regarding coming in for OV/FU. Advised provider will be out next 2 days and we needed to gert him scheduled before medications run out.

## 2015-04-07 ENCOUNTER — Encounter: Payer: Self-pay | Admitting: Medical

## 2015-04-07 NOTE — Progress Notes (Signed)
Called patient regarding BP follow up states he has been taking care of his sick wife. Will call and schedule an appointment within the next month.

## 2015-04-26 ENCOUNTER — Ambulatory Visit (INDEPENDENT_AMBULATORY_CARE_PROVIDER_SITE_OTHER): Payer: Medicare Other | Admitting: Medical

## 2015-04-26 ENCOUNTER — Encounter: Payer: Self-pay | Admitting: Medical

## 2015-04-26 VITALS — BP 150/88 | HR 73 | Temp 97.6°F | Ht <= 58 in | Wt 181.6 lb

## 2015-04-26 DIAGNOSIS — E785 Hyperlipidemia, unspecified: Secondary | ICD-10-CM | POA: Insufficient documentation

## 2015-04-26 DIAGNOSIS — I10 Essential (primary) hypertension: Secondary | ICD-10-CM | POA: Diagnosis not present

## 2015-04-26 HISTORY — DX: Hyperlipidemia, unspecified: E78.5

## 2015-04-26 MED ORDER — SIMVASTATIN 20 MG PO TABS: 20.0000 mg | ORAL_TABLET | Freq: Every day | ORAL | Status: DC

## 2015-04-26 MED ORDER — LISINOPRIL 20 MG PO TABS: 20.0000 mg | ORAL_TABLET | Freq: Every day | ORAL | Status: DC

## 2015-04-26 NOTE — Progress Notes (Signed)
Pre visit review using our clinic review tool, if applicable. No additional management support is needed unless otherwise documented below in the visit note. 

## 2015-04-26 NOTE — Patient Instructions (Signed)
Essential hypertension I want your bp to be better than it has been. Will increase lisinopril dose to 20 mg every day. Check bp every day. Document readings. Follow up here in 2 wks.   Hyperlipidemia Continue simvastatin daily. Repeat cmp and lipid panel in 2 wks fasting.

## 2015-04-26 NOTE — Progress Notes (Signed)
Subjective:    Patient ID: Donald Porter, male    DOB: 20-Feb-1941, 74 y.o.   MRN: [id number]  HPI   Pt in for check up. Pt states not in for a while. His wife got sick with gallbladder. So he could not come in due to wife sepis and pneumonia. His wife had hard time recovering. Pt is the care give and no local family.  Pt blood pressure is elevated today. His bp in November was elevated. Pt states when checks his bp is 150/??. But consistently on high side. No report of neuro or cardiac signs or symptoms. Maybe too much sodium in his diet.   Pt lipid panel was elevated 5 months ago. Pt is currently on simvastatin 20 mg q day. Pt has been compliant on cholestertol medication.  Pt eating twice a day. Not much fast foods or take out.      Review of Systems  Constitutional: Negative for fever, chills, diaphoresis, activity change and fatigue.  Respiratory: Negative for cough, chest tightness and shortness of breath.   Cardiovascular: Negative for chest pain, palpitations and leg swelling.  Gastrointestinal: Negative for nausea, vomiting and abdominal pain.  Musculoskeletal: Negative for neck pain and neck stiffness.  Neurological: Negative for dizziness, tremors, seizures, syncope, facial asymmetry, speech difficulty, weakness, light-headedness, numbness and headaches.  Psychiatric/Behavioral: Negative for behavioral problems, confusion and agitation. The patient is not nervous/anxious.         Past Medical History  Diagnosis Date  . Allergy   . Asthma     1988  . GERD (gastroesophageal reflux disease)   . Hypertension   . Hyperlipidemia 04/26/2015    History   Social History  . Marital Status: Married    Spouse Name: N/A  . Number of Children: N/A  . Years of Education: N/A   Occupational History  . Not on file.   Social History Main Topics  . Smoking status: Never Smoker   . Smokeless tobacco: Never Used  . Alcohol Use: 0.0 oz/week    0 Standard drinks or  equivalent per week  . Drug Use: Not on file  . Sexual Activity: Not on file   Other Topics Concern  . Not on file   Social History Narrative    Past Surgical History  Procedure Laterality Date  . Hernia repair      3 surgeries  . Eye surgery      Age 38    Family History  Problem Relation Age of Onset  . Heart disease Mother   . Lymphoma Father   . Cancer Father     No Known Allergies  Current Outpatient Prescriptions on File Prior to Visit  Medication Sig Dispense Refill  . lisinopril (PRINIVIL,ZESTRIL) 10 MG tablet Take 1 tablet (10 mg total) by mouth daily. 30 tablet 1  . simvastatin (ZOCOR) 20 MG tablet Take 1 tablet (20 mg total) by mouth at bedtime. 30 tablet 1   No current facility-administered medications on file prior to visit.    BP 150/88 mmHg  Pulse 73  Temp(Src) 97.6 F (36.4 C) (Oral)  Ht 4' 7.2" (1.402 m)  Wt 181 lb 9.6 oz (82.373 kg)  BMI 41.91 kg/m2  SpO2 95%       Objective:   Physical Exam  General Mental Status- Alert. General Appearance- Not in acute distress.   Skin General: Color- Normal Color. Moisture- Normal Moisture.  Neck Carotid Arteries- Normal color. Moisture- Normal Moisture. No carotid bruits. No JVD.  Chest and Lung Exam Auscultation: Breath Sounds:-Normal. CTA.  Cardiovascular Auscultation:Rythm- Regular, rate and rhythm Murmurs & Other Heart Sounds:Auscultation of the heart reveals- No Murmurs.    Neurologic Cranial Nerve exam:- CN III-XII intact(No nystagmus), symmetric smile. Strength:- 5/5 equal and symmetric strength both upper and lower extremities.      Assessment & Plan:

## 2015-04-26 NOTE — Assessment & Plan Note (Signed)
I want your bp to be better than it has been. Will increase lisinopril dose to 20 mg every day. Check bp every day. Document readings. Follow up here in 2 wks.

## 2015-04-26 NOTE — Assessment & Plan Note (Signed)
Continue simvastatin daily. Repeat cmp and lipid panel in 2 wks fasting.

## 2015-05-10 ENCOUNTER — Encounter: Payer: Self-pay | Admitting: Medical

## 2015-05-10 ENCOUNTER — Ambulatory Visit (INDEPENDENT_AMBULATORY_CARE_PROVIDER_SITE_OTHER): Payer: Medicare Other | Admitting: Medical

## 2015-05-10 VITALS — BP 170/80 | HR 76 | Temp 96.9°F | Ht 67.2 in | Wt 179.4 lb

## 2015-05-10 DIAGNOSIS — E785 Hyperlipidemia, unspecified: Secondary | ICD-10-CM | POA: Diagnosis not present

## 2015-05-10 DIAGNOSIS — I10 Essential (primary) hypertension: Secondary | ICD-10-CM

## 2015-05-10 LAB — COMPREHENSIVE METABOLIC PANEL
ALBUMIN: 4 g/dL (ref 3.5–5.2)
ALT: 21 U/L (ref 0–53)
AST: 23 U/L (ref 0–37)
Alkaline Phosphatase: 51 U/L (ref 39–117)
BUN: 19 mg/dL (ref 6–23)
CALCIUM: 9.6 mg/dL (ref 8.4–10.5)
CHLORIDE: 101 meq/L (ref 96–112)
CO2: 31 meq/L (ref 19–32)
Creatinine, Ser: 0.96 mg/dL (ref 0.40–1.50)
GFR: 81.03 mL/min (ref >60.00)
GLUCOSE: 93 mg/dL (ref 70–99)
POTASSIUM: 4.4 meq/L (ref 3.5–5.1)
Sodium: 137 mEq/L (ref 135–145)
TOTAL PROTEIN: 7.6 g/dL (ref 6.0–8.3)
Total Bilirubin: 1 mg/dL (ref 0.2–1.2)

## 2015-05-10 LAB — LIPID PANEL
Cholesterol: 193 mg/dL (ref 0–200)
HDL: 67.8 mg/dL (ref >39.00)
LDL CALC: 111 mg/dL — AB (ref 0–99)
NONHDL: 125.2
Total CHOL/HDL Ratio: 3
Triglycerides: 71 mg/dL (ref 0.0–149.0)
VLDL: 14.2 mg/dL (ref 0.0–40.0)

## 2015-05-10 MED ORDER — AMLODIPINE BESYLATE 5 MG PO TABS: 5.0000 mg | ORAL_TABLET | Freq: Every day | ORAL | Status: DC

## 2015-05-10 NOTE — Assessment & Plan Note (Signed)
cmp and lipid panel check today.

## 2015-05-10 NOTE — Progress Notes (Signed)
Subjective:    Patient ID: Donald Porter, male    DOB: [birth year], 74 y.o.   MRN: [id number]  HPI  Pt in states wife is doing better these days. Pt is less stressed. CNA helping with house.   Pt in for bp follow up. No cardiac or neurologic signs or symptoms. Pt has not been checking his bp levels. Pt only active around house since his wife not able to take care of house.   Pt is in fasting for lipid check.      Review of Systems  Constitutional: Negative for fever, chills, diaphoresis, activity change and fatigue.  Respiratory: Negative for cough, chest tightness and shortness of breath.   Cardiovascular: Negative for chest pain, palpitations and leg swelling.  Gastrointestinal: Negative for nausea, vomiting and abdominal pain.  Musculoskeletal: Negative for neck pain and neck stiffness.  Neurological: Negative for dizziness, tremors, seizures, syncope, facial asymmetry, speech difficulty, weakness, light-headedness, numbness and headaches.  Psychiatric/Behavioral: Negative for behavioral problems, confusion and agitation. The patient is not nervous/anxious.     Past Medical History  Diagnosis Date  . Allergy   . Asthma     1988  . GERD (gastroesophageal reflux disease)   . Hypertension   . Hyperlipidemia 04/26/2015    History   Social History  . Marital Status: Married    Spouse Name: N/A  . Number of Children: N/A  . Years of Education: N/A   Occupational History  . Not on file.   Social History Main Topics  . Smoking status: Never Smoker   . Smokeless tobacco: Never Used  . Alcohol Use: 0.0 oz/week    0 Standard drinks or equivalent per week  . Drug Use: Not on file  . Sexual Activity: Not on file   Other Topics Concern  . Not on file   Social History Narrative    Past Surgical History  Procedure Laterality Date  . Hernia repair      3 surgeries  . Eye surgery      Age 55    Family History  Problem Relation Age of Onset  . Heart disease  Mother   . Lymphoma Father   . Cancer Father     No Known Allergies  Current Outpatient Prescriptions on File Prior to Visit  Medication Sig Dispense Refill  . lisinopril (PRINIVIL,ZESTRIL) 20 MG tablet Take 1 tablet (20 mg total) by mouth daily. 30 tablet 0  . simvastatin (ZOCOR) 20 MG tablet Take 1 tablet (20 mg total) by mouth at bedtime. 30 tablet 0   No current facility-administered medications on file prior to visit.    BP 188/77 mmHg  Pulse 76  Temp(Src) 96.9 F (36.1 C) (Oral)  Ht 5' 7.2" (1.707 m)  Wt 179 lb 6.4 oz (81.375 kg)  BMI 27.93 kg/m2  SpO2 96%       Objective:   Physical Exam  General Mental Status- Alert. General Appearance- Not in acute distress.   Skin General: Color- Normal Color. Moisture- Normal Moisture.  Neck Carotid Arteries- Normal color. Moisture- Normal Moisture. No carotid bruits. No JVD.  Chest and Lung Exam Auscultation: Breath Sounds:-Normal. CTA  Cardiovascular Auscultation:Rythm- Regular. Rate and rhythm. Murmurs & Other Heart Sounds:Auscultation of the heart reveals- No Murmurs.  Abdomen Inspection:-Inspeection Normal. Palpation/Percussion:Note:No mass. Palpation and Percussion of the abdomen reveal- Non Tender, Non Distended + BS, no rebound or guarding.    Neurologic Cranial Nerve exam:- CN III-XII intact(No nystagmus), symmetric smile. Strength:- 5/5 equal and  symmetric strength both upper and lower extremities.     Assessment & Plan:

## 2015-05-10 NOTE — Assessment & Plan Note (Signed)
Blood pressure not controlled today. Instead blood pressure increased form last visit despite increasing lisinopril. We don't have daily trend readings.   I want you to continue lisinopril 20 mg a day. I am adding amlodipine 5 mg a day.  Take bp reading daily and call/update [place] on readings in 2 wks. At that point decide if we need to increase amlodipine to 10 mg or stay at 5 mg.

## 2015-05-10 NOTE — Patient Instructions (Addendum)
Essential hypertension Blood pressure not controlled today. Instead blood pressure increased form last visit despite increasing lisinopril. We don't have daily trend readings.   I want you to continue lisinopril 20 mg a day. I am adding amlodipine 5 mg a day.  Take bp reading daily and call/update [place] on readings in 2 wks. At that point decide if we need to increase amlodipine to 10 mg or stay at 5 mg.        Hyperlipidemia cmp and lipid panel check today.     Follow up in 2 wks by phone or as needed. Office follow up date to be determined after labs and after bp report.  Any neuro or cardio signs or symptoms then ED evaluation. I did advise pt he could call in 2 wks due to fact that he still has to watch his wife closely and appointment may be burden in terms of scheduling with wife cna. So if doing well can handle bp adjustment by phone in 2 wks.

## 2015-05-10 NOTE — Progress Notes (Signed)
Pre visit review using our clinic review tool, if applicable. No additional management support is needed unless otherwise documented below in the visit note. 

## 2015-05-18 DIAGNOSIS — Z85828 Personal history of other malignant neoplasm of skin: Secondary | ICD-10-CM | POA: Diagnosis not present

## 2015-05-18 DIAGNOSIS — Z08 Encounter for follow-up examination after completed treatment for malignant neoplasm: Secondary | ICD-10-CM | POA: Diagnosis not present

## 2015-05-18 DIAGNOSIS — L821 Other seborrheic keratosis: Secondary | ICD-10-CM | POA: Diagnosis not present

## 2015-05-18 DIAGNOSIS — D225 Melanocytic nevi of trunk: Secondary | ICD-10-CM | POA: Diagnosis not present

## 2015-05-18 DIAGNOSIS — L570 Actinic keratosis: Secondary | ICD-10-CM | POA: Diagnosis not present

## 2015-05-22 ENCOUNTER — Other Ambulatory Visit: Payer: Self-pay | Admitting: Medical

## 2015-06-05 ENCOUNTER — Other Ambulatory Visit: Payer: Self-pay | Admitting: Medical

## 2015-06-08 NOTE — Telephone Encounter (Signed)
Called patient appointment scheduled for 05/14/15.

## 2015-06-14 ENCOUNTER — Ambulatory Visit (INDEPENDENT_AMBULATORY_CARE_PROVIDER_SITE_OTHER): Payer: Medicare Other | Admitting: Medical

## 2015-06-14 ENCOUNTER — Encounter: Payer: Self-pay | Admitting: Medical

## 2015-06-14 VITALS — BP 135/69 | HR 75 | Temp 98.6°F | Ht 67.2 in | Wt 181.0 lb

## 2015-06-14 DIAGNOSIS — I10 Essential (primary) hypertension: Secondary | ICD-10-CM

## 2015-06-14 DIAGNOSIS — E785 Hyperlipidemia, unspecified: Secondary | ICD-10-CM | POA: Diagnosis not present

## 2015-06-14 MED ORDER — AMLODIPINE BESYLATE 5 MG PO TABS: 5.0000 mg | ORAL_TABLET | Freq: Every day | ORAL | Status: DC

## 2015-06-14 MED ORDER — SIMVASTATIN 20 MG PO TABS: 20.0000 mg | ORAL_TABLET | Freq: Every day | ORAL | Status: DC

## 2015-06-14 NOTE — Patient Instructions (Signed)
Essential hypertension Much improved now on lisinopril 20 mg 1 tab po q day. Also on amlodipin 5 mg q day.  Hyperlipidemia Continue simvastatin. Diet and exercise.   Repeat lipid panel in November with cmp.  Follow up November or as needed.

## 2015-06-14 NOTE — Assessment & Plan Note (Signed)
Continue simvastatin. Diet and exercise.

## 2015-06-14 NOTE — Assessment & Plan Note (Signed)
Much improved now on lisinopril 20 mg 1 tab po q day. Also on amlodipin 5 mg q day.

## 2015-06-14 NOTE — Progress Notes (Signed)
   Subjective:    Patient ID: Donald Porter, male    DOB: Jul 08, 1941, 74 y.o.   MRN: [id number]  HPI  Pt in for follow up. Pt bp is well controlled today. Level 135/69. No cardiac or neuro signs or symptoms. Pt is checking at home systolic 833'A/[id number] 34-19.  Was higher last time 170/80. Some exercise walking.  Pt lipids were high but improved from before 6 months ago. Pt is on simvastatin.     Review of Systems  Constitutional: Negative for fever, chills, diaphoresis, activity change and fatigue.  Respiratory: Negative for cough, chest tightness and shortness of breath.   Cardiovascular: Negative for chest pain, palpitations and leg swelling.  Gastrointestinal: Negative for nausea, vomiting and abdominal pain.  Musculoskeletal: Negative for neck pain and neck stiffness.  Neurological: Negative for dizziness, tremors, seizures, syncope, facial asymmetry, speech difficulty, weakness, light-headedness, numbness and headaches.  Psychiatric/Behavioral: Negative for behavioral problems, confusion and agitation. The patient is not nervous/anxious.      Past Medical History  Diagnosis Date  . Allergy   . Asthma     1988  . GERD (gastroesophageal reflux disease)   . Hypertension   . Hyperlipidemia 04/26/2015    History   Social History  . Marital Status: Married    Spouse Name: N/A  . Number of Children: N/A  . Years of Education: N/A   Occupational History  . Not on file.   Social History Main Topics  . Smoking status: Never Smoker   . Smokeless tobacco: Never Used  . Alcohol Use: 0.0 oz/week    0 Standard drinks or equivalent per week  . Drug Use: Not on file  . Sexual Activity: Not on file   Other Topics Concern  . Not on file   Social History Narrative    Past Surgical History  Procedure Laterality Date  . Hernia repair      3 surgeries  . Eye surgery      Age 58    Family History  Problem Relation Age of Onset  . Heart disease Mother   . Lymphoma  Father   . Cancer Father     No Known Allergies  Current Outpatient Prescriptions on File Prior to Visit  Medication Sig Dispense Refill  . amLODipine (NORVASC) 5 MG tablet TAKE 1 TABLET (5 MG TOTAL) BY MOUTH DAILY. 30 tablet 0  . lisinopril (PRINIVIL,ZESTRIL) 20 MG tablet TAKE 1 TABLET (20 MG TOTAL) BY MOUTH DAILY. 30 tablet 5  . simvastatin (ZOCOR) 20 MG tablet Take 1 tablet (20 mg total) by mouth at bedtime. 30 tablet 0   No current facility-administered medications on file prior to visit.    BP 135/69 mmHg  Pulse 75  Temp(Src) 98.6 F (37 C) (Oral)  Ht 5' 7.2" (1.707 m)  Wt 181 lb (82.101 kg)  BMI 28.18 kg/m2  SpO2 97%       Objective:   Physical Exam  General Mental Status- Alert. General Appearance- Not in acute distress.   Skin General: Color- Normal Color. Moisture- Normal Moisture.  Neck Carotid Arteries- Normal color. Moisture- Normal Moisture. No carotid bruits. No JVD.  Chest and Lung Exam Auscultation: Breath Sounds:-Normal.  Cardiovascular Auscultation:Rythm- Regular. Murmurs & Other Heart Sounds:Auscultation of the heart reveals- No Murmurs.  .  Neurologic Cranial Nerve exam:- CN III-XII intact(No nystagmus), symmetric smile. Strength:- 5/5 equal and symmetric strength both upper and lower extremities.      Assessment & Plan:

## 2015-06-14 NOTE — Progress Notes (Signed)
Pre visit review using our clinic review tool, if applicable. No additional management support is needed unless otherwise documented below in the visit note. 

## 2015-07-05 ENCOUNTER — Other Ambulatory Visit: Payer: Self-pay | Admitting: Medical

## 2015-07-06 NOTE — Telephone Encounter (Signed)
Left message on patients answering machine that medications have been filled and to follow up in 3 months.

## 2015-11-07 ENCOUNTER — Ambulatory Visit: Payer: PRIVATE HEALTH INSURANCE | Admitting: Medical

## 2015-11-09 ENCOUNTER — Encounter: Payer: Self-pay | Admitting: Medical

## 2015-11-09 ENCOUNTER — Ambulatory Visit (INDEPENDENT_AMBULATORY_CARE_PROVIDER_SITE_OTHER): Payer: Medicare Other | Admitting: Medical

## 2015-11-09 VITALS — BP 128/76 | HR 80 | Temp 98.0°F | Ht 67.0 in | Wt 179.0 lb

## 2015-11-09 DIAGNOSIS — Z23 Encounter for immunization: Secondary | ICD-10-CM

## 2015-11-09 DIAGNOSIS — R351 Nocturia: Secondary | ICD-10-CM | POA: Diagnosis not present

## 2015-11-09 DIAGNOSIS — I10 Essential (primary) hypertension: Secondary | ICD-10-CM | POA: Diagnosis not present

## 2015-11-09 DIAGNOSIS — E785 Hyperlipidemia, unspecified: Secondary | ICD-10-CM

## 2015-11-09 DIAGNOSIS — N400 Enlarged prostate without lower urinary tract symptoms: Secondary | ICD-10-CM

## 2015-11-09 LAB — CBC WITH DIFFERENTIAL/PLATELET
Basophils Absolute: 0.1 10*3/uL (ref 0.0–0.1)
Basophils Relative: 0.9 % (ref 0.0–3.0)
EOS PCT: 4.2 % (ref 0.0–5.0)
Eosinophils Absolute: 0.4 10*3/uL (ref 0.0–0.7)
HCT: 43.8 % (ref 39.0–52.0)
HEMOGLOBIN: 14.8 g/dL (ref 13.0–17.0)
Lymphocytes Relative: 15.2 % (ref 12.0–46.0)
Lymphs Abs: 1.5 10*3/uL (ref 0.7–4.0)
MCHC: 33.6 g/dL (ref 30.0–36.0)
MCV: 92.3 fl (ref 78.0–100.0)
Monocytes Absolute: 1.2 10*3/uL — ABNORMAL HIGH (ref 0.1–1.0)
Monocytes Relative: 12.6 % — ABNORMAL HIGH (ref 3.0–12.0)
Neutro Abs: 6.6 10*3/uL (ref 1.4–7.7)
Neutrophils Relative %: 67.1 % (ref 43.0–77.0)
Platelets: 277 10*3/uL (ref 150.0–400.0)
RBC: 4.75 Mil/uL (ref 4.22–5.81)
RDW: 13.6 % (ref 11.5–15.5)
WBC: 9.9 10*3/uL (ref 4.0–10.5)

## 2015-11-09 LAB — POCT URINALYSIS DIPSTICK
Bilirubin, UA: NEGATIVE
GLUCOSE UA: NEGATIVE
Ketones, UA: NEGATIVE
Leukocytes, UA: NEGATIVE
NITRITE UA: NEGATIVE
PH UA: 5.5
RBC UA: NEGATIVE
SPEC GRAV UA: 1.015
Urobilinogen, UA: 0.2

## 2015-11-09 LAB — COMPREHENSIVE METABOLIC PANEL
ALBUMIN: 4.5 g/dL (ref 3.5–5.2)
ALK PHOS: 57 U/L (ref 39–117)
ALT: 20 U/L (ref 0–53)
AST: 19 U/L (ref 0–37)
BILIRUBIN TOTAL: 0.8 mg/dL (ref 0.2–1.2)
BUN: 16 mg/dL (ref 6–23)
CO2: 30 mEq/L (ref 19–32)
Calcium: 9.9 mg/dL (ref 8.4–10.5)
Chloride: 96 mEq/L (ref 96–112)
Creatinine, Ser: 1.06 mg/dL (ref 0.40–1.50)
GFR: 72.18 mL/min (ref >60.00)
GLUCOSE: 106 mg/dL — AB (ref 70–99)
POTASSIUM: 4.6 meq/L (ref 3.5–5.1)
Sodium: 133 mEq/L — ABNORMAL LOW (ref 135–145)
TOTAL PROTEIN: 7.7 g/dL (ref 6.0–8.3)

## 2015-11-09 LAB — PSA, MEDICARE: PSA: 11.43 ng/mL — AB (ref 0.10–4.00)

## 2015-11-09 MED ORDER — TAMSULOSIN HCL 0.4 MG PO CAPS: 0.4000 mg | ORAL_CAPSULE | Freq: Every day | ORAL | Status: DC

## 2015-11-09 NOTE — Progress Notes (Signed)
Pre visit review using our clinic review tool, if applicable. No additional management support is needed unless otherwise documented below in the visit note. 

## 2015-11-09 NOTE — Progress Notes (Signed)
Subjective:    Patient ID: Donald Porter, male    DOB: 1941/06/08, 74 y.o.   MRN: [id number]  HPI  Pt bp is well controlled. He is checking at home. It is always controlled( usually 130/80 when he checks). No cardiac or neurologic signs or symptoms.   Pt seen last may his lipids were well controlled. Pt is still on cholesterol medication.   Pt states he has been using bathroom 4-5 times a night. Not dribbling. No hx of elevated psa. These symptoms occuring over last year. No hx of any prostrate ca. But dad deceased cancer age 44 yo.    Review of Systems  Constitutional: Negative for chills and fatigue.  Respiratory: Negative for cough, chest tightness, shortness of breath and wheezing.   Cardiovascular: Negative for chest pain and palpitations.  Gastrointestinal: Negative for abdominal pain.  Genitourinary: Positive for frequency. Negative for dysuria, decreased urine volume, penile swelling, difficulty urinating, penile pain and testicular pain.  Musculoskeletal: Negative for back pain.  Skin: Negative for rash.  Neurological: Negative for dizziness, seizures, syncope, weakness and light-headedness.  Hematological: Negative for adenopathy. Does not bruise/bleed easily.  Psychiatric/Behavioral: Negative for behavioral problems, confusion and sleep disturbance. The patient is not nervous/anxious.     Past Medical History  Diagnosis Date  . Allergy   . Asthma     1988  . GERD (gastroesophageal reflux disease)   . Hypertension   . Hyperlipidemia 04/26/2015    Social History   Social History  . Marital Status: Married    Spouse Name: N/A  . Number of Children: N/A  . Years of Education: N/A   Occupational History  . Not on file.   Social History Main Topics  . Smoking status: Never Smoker   . Smokeless tobacco: Never Used  . Alcohol Use: 0.0 oz/week    0 Standard drinks or equivalent per week  . Drug Use: Not on file  . Sexual Activity: Not on file   Other Topics  Concern  . Not on file   Social History Narrative    Past Surgical History  Procedure Laterality Date  . Hernia repair      3 surgeries  . Eye surgery      Age 36    Family History  Problem Relation Age of Onset  . Heart disease Mother   . Lymphoma Father   . Cancer Father     No Known Allergies  Current Outpatient Prescriptions on File Prior to Visit  Medication Sig Dispense Refill  . amLODipine (NORVASC) 5 MG tablet TAKE 1 TABLET (5 MG TOTAL) BY MOUTH DAILY. 30 tablet 3  . lisinopril (PRINIVIL,ZESTRIL) 20 MG tablet TAKE 1 TABLET (20 MG TOTAL) BY MOUTH DAILY. 30 tablet 5  . simvastatin (ZOCOR) 20 MG tablet TAKE 1 TABLET (20 MG TOTAL) BY MOUTH AT BEDTIME. 90 tablet 0   No current facility-administered medications on file prior to visit.    BP 128/76 mmHg  Pulse 80  Temp(Src) 98 F (36.7 C) (Oral)  Ht 5\' 7"  (1.702 m)  Wt 179 lb (81.194 kg)  BMI 28.03 kg/m2  SpO2 98%       Objective:   Physical Exam  General Mental Status- Alert. General Appearance- Not in acute distress.   Skin General: Color- Normal Color. Moisture- Normal Moisture.  Neck Carotid Arteries- Normal color. Moisture- Normal Moisture. No carotid bruits. No JVD.  Chest and Lung Exam Auscultation: Breath Sounds:-Normal.  Cardiovascular Auscultation:Rythm- Regular. Murmurs & Other Heart  Sounds:Auscultation of the heart reveals- No Murmurs.  Abdomen Inspection:-Inspeection Normal. Palpation/Percussion:Note:No mass. Palpation and Percussion of the abdomen reveal- Non Tender, Non Distended + BS, no rebound or guarding.    Neurologic Cranial Nerve exam:- CN III-XII intact(No nystagmus), symmetric smile. Strength:- 5/5 equal and symmetric strength both upper and lower extremities.  Rectal Anorectal Exam: Performed- Normal sphincter tone. No masses noted. Prostate smooth mild enlarged size(not boggy, no nodules). Stool HEME Negative.      Assessment & Plan:  Bp controlled presently.  Continue current meds. Get cbc and cmp.  Recheck lipids today.  Get ua today. If any abnormal finding get urine culture. Get psa today. Rx flomax(rx advisement given)  Flu vaccine and zostavax today.  Give [place] update if flomax helping with urinary symptoms.  Follow up date to be determined post lab review.

## 2015-11-09 NOTE — Addendum Note (Signed)
Addended by: Tasia Catchings on: 11/09/2015 09:39 AM   Modules accepted: Orders

## 2015-11-09 NOTE — Patient Instructions (Addendum)
Bp controlled presently. Continue current meds. Get cbc and cmp.  Recheck lipids today.  Get ua today. If any abnormal finding get urine culture. Get psa today. Rx flomax(rx advisement given)  Flu vaccine and zostavax today.  Give [place] update if flomax helping with urinary symptoms.  Follow up date to be determined post lab review.

## 2015-11-09 NOTE — Addendum Note (Signed)
Addended by: Tasia Catchings on: 11/09/2015 09:31 AM   Modules accepted: Orders

## 2015-11-10 ENCOUNTER — Other Ambulatory Visit: Payer: Self-pay | Admitting: Medical

## 2015-11-10 ENCOUNTER — Telehealth: Payer: Self-pay | Admitting: Medical

## 2015-11-10 DIAGNOSIS — R972 Elevated prostate specific antigen [PSA]: Secondary | ICD-10-CM

## 2015-11-10 MED ORDER — AMLODIPINE BESYLATE 5 MG PO TABS: ORAL_TABLET | ORAL | Status: DC

## 2015-11-10 NOTE — Telephone Encounter (Signed)
I notified pt of his labs. And recent psa value. Will go ahead and refer him to urologist. Will get repeat psa in about 2 wks to assess psa velocity.

## 2015-11-13 ENCOUNTER — Telehealth: Payer: Self-pay | Admitting: Medical

## 2015-11-13 NOTE — Telephone Encounter (Signed)
I am covering for Edward.  Advise pt to check BP once daily and hold amlodipine if BP <120/80.  OK to take amlodipine if SBP >120.  Follow up with edward in 1 week.

## 2015-11-13 NOTE — Telephone Encounter (Signed)
Pt states that he noticed that his blood pressure has dropped to 104/62 this am and pt states he was started on Flomax 11/10/15.Pt states he read online" that one of the side effects is low blood pressure".  Pt is wanting to stop the amlodipine until he can get in the office to see [name] on 11/21/15. Please advise.

## 2015-11-13 NOTE — Telephone Encounter (Signed)
Spoke with pt and he voices understanding. Pt has follow up on 11/21/15.

## 2015-11-13 NOTE — Telephone Encounter (Signed)
Caller name: Jaz  Relationship to patient: Self  Can be reached: [phone]  Pharmacy:  Reason for call: pt says that he started a new Rx over the weekend for [facility] Pt says that he would like to speak with the someone directly in regards to the side effects.   Please advise pt.   Thanks.

## 2015-11-14 ENCOUNTER — Encounter: Payer: Self-pay | Admitting: Medical

## 2015-11-21 ENCOUNTER — Encounter: Payer: Self-pay | Admitting: Medical

## 2015-11-21 ENCOUNTER — Ambulatory Visit (INDEPENDENT_AMBULATORY_CARE_PROVIDER_SITE_OTHER): Payer: Medicare Other | Admitting: Medical

## 2015-11-21 VITALS — BP 140/90 | HR 77 | Temp 97.9°F | Ht 67.0 in | Wt 182.0 lb

## 2015-11-21 DIAGNOSIS — R972 Elevated prostate specific antigen [PSA]: Secondary | ICD-10-CM

## 2015-11-21 DIAGNOSIS — I10 Essential (primary) hypertension: Secondary | ICD-10-CM | POA: Diagnosis not present

## 2015-11-21 MED ORDER — AMLODIPINE BESYLATE 2.5 MG PO TABS: 2.5000 mg | ORAL_TABLET | Freq: Every day | ORAL | Status: DC

## 2015-11-21 NOTE — Progress Notes (Signed)
Subjective:    Patient ID: Donald Porter, male    DOB: 11-Jan-1941, 74 y.o.   MRN: [id number]  HPI  Pt in for bp follow up. Pt states his bp has been controlled. He is on lisinopril for htn. He is not taking norvasc. He states early on with use of flomax he got 104/80 after first tablet. Then he stopped norvasc on advisement of our staff when he called stating bp was low.   Pt bp was 0000000 systolic this am in our office. Higher when I checked.  Pt is urinating better. With use of flomax. Pt high psa recently. Appointment with them urologist this  Monday.    Review of Systems  Constitutional: Negative for fever, chills, diaphoresis, activity change and fatigue.  Respiratory: Negative for cough, chest tightness and shortness of breath.   Cardiovascular: Negative for chest pain, palpitations and leg swelling.  Gastrointestinal: Negative for nausea, vomiting and abdominal pain.  Musculoskeletal: Negative for neck pain and neck stiffness.  Neurological: Negative for dizziness, tremors, seizures, syncope, facial asymmetry, speech difficulty, weakness, light-headedness, numbness and headaches.  Psychiatric/Behavioral: Negative for behavioral problems, confusion and agitation. The patient is not nervous/anxious.     Past Medical History  Diagnosis Date  . Allergy   . Asthma     1988  . GERD (gastroesophageal reflux disease)   . Hypertension   . Hyperlipidemia 04/26/2015    Social History   Social History  . Marital Status: Married    Spouse Name: N/A  . Number of Children: N/A  . Years of Education: N/A   Occupational History  . Not on file.   Social History Main Topics  . Smoking status: Never Smoker   . Smokeless tobacco: Never Used  . Alcohol Use: 0.0 oz/week    0 Standard drinks or equivalent per week  . Drug Use: Not on file  . Sexual Activity: Not on file   Other Topics Concern  . Not on file   Social History Narrative    Past Surgical History  Procedure  Laterality Date  . Hernia repair      3 surgeries  . Eye surgery      Age 63    Family History  Problem Relation Age of Onset  . Heart disease Mother   . Lymphoma Father   . Cancer Father     No Known Allergies  Current Outpatient Prescriptions on File Prior to Visit  Medication Sig Dispense Refill  . lisinopril (PRINIVIL,ZESTRIL) 20 MG tablet TAKE 1 TABLET (20 MG TOTAL) BY MOUTH DAILY. 30 tablet 5  . simvastatin (ZOCOR) 20 MG tablet TAKE 1 TABLET (20 MG TOTAL) BY MOUTH AT BEDTIME. 90 tablet 0  . tamsulosin (FLOMAX) 0.4 MG CAPS capsule Take 1 capsule (0.4 mg total) by mouth daily. 30 capsule 0  . amLODipine (NORVASC) 5 MG tablet TAKE 1 TABLET (5 MG TOTAL) BY MOUTH DAILY. (Patient not taking: Reported on 11/21/2015) 90 tablet 3   No current facility-administered medications on file prior to visit.    BP 138/84 mmHg  Pulse 77  Temp(Src) 97.9 F (36.6 C) (Oral)  Ht 5\' 7"  (1.702 m)  Wt 182 lb (82.555 kg)  BMI 28.50 kg/m2  SpO2 98%       Objective:   Physical Exam  General Mental Status- Alert. General Appearance- Not in acute distress.   Skin General: Color- Normal Color. Moisture- Normal Moisture.  Neck Carotid Arteries- Normal color. Moisture- Normal Moisture. No carotid bruits. No  JVD.  Chest and Lung Exam Auscultation: Breath Sounds:-Normal.  Cardiovascular Auscultation:Rythm- Regular. Murmurs & Other Heart Sounds:Auscultation of the heart reveals- No Murmurs.  Abdomen Inspection:-Inspeection Normal. Palpation/Percussion:Note:No mass. Palpation and Percussion of the abdomen reveal- Non Tender, Non Distended + BS, no rebound or guarding.   Neurologic Cranial Nerve exam:- CN III-XII intact(No nystagmus), symmetric smile. Strength:- 5/5 equal and symmetric strength both upper and lower extremities.      Assessment & Plan:   For your blood pressure continue the lisinopril tablet. Since you had some hypotension with early flomax dose I want you to  restart norvasc but at 2.5 mg dose. Check bp daily. If not controlled may need to increase to full 5 mg tab.   For your elevated psa. See urologist this coming Sunday.  Follow up here in 2 wks with blood pressure log. Or as needed.

## 2015-11-21 NOTE — Patient Instructions (Addendum)
For your blood pressure continue the lisinopril tablet same dose. Since you had some hypotension with early flomax dose I want you to restart norvasc but at 2.5 mg dose. Check bp daily. If not controlled may need to increase to full 5 mg tab.   For your elevated psa. See urologist this coming Sunday.  Follow up here in 2 wks with blood pressure log. Or as needed.

## 2015-11-21 NOTE — Progress Notes (Signed)
Pre visit review using our clinic review tool, if applicable. No additional management support is needed unless otherwise documented below in the visit note. 

## 2015-11-27 ENCOUNTER — Other Ambulatory Visit: Payer: Self-pay | Admitting: Medical

## 2015-11-28 DIAGNOSIS — Z08 Encounter for follow-up examination after completed treatment for malignant neoplasm: Secondary | ICD-10-CM | POA: Diagnosis not present

## 2015-11-28 DIAGNOSIS — Z85828 Personal history of other malignant neoplasm of skin: Secondary | ICD-10-CM | POA: Diagnosis not present

## 2015-11-28 DIAGNOSIS — L570 Actinic keratosis: Secondary | ICD-10-CM | POA: Diagnosis not present

## 2015-11-28 DIAGNOSIS — L821 Other seborrheic keratosis: Secondary | ICD-10-CM | POA: Diagnosis not present

## 2015-12-04 DIAGNOSIS — R972 Elevated prostate specific antigen [PSA]: Secondary | ICD-10-CM | POA: Diagnosis not present

## 2015-12-04 DIAGNOSIS — N400 Enlarged prostate without lower urinary tract symptoms: Secondary | ICD-10-CM | POA: Diagnosis not present

## 2015-12-07 ENCOUNTER — Encounter: Payer: Self-pay | Admitting: Medical

## 2015-12-07 ENCOUNTER — Ambulatory Visit (INDEPENDENT_AMBULATORY_CARE_PROVIDER_SITE_OTHER): Payer: Medicare Other | Admitting: Medical

## 2015-12-07 VITALS — BP 124/82 | HR 71 | Temp 97.8°F | Ht 67.0 in | Wt 182.2 lb

## 2015-12-07 DIAGNOSIS — Z23 Encounter for immunization: Secondary | ICD-10-CM

## 2015-12-07 DIAGNOSIS — R972 Elevated prostate specific antigen [PSA]: Secondary | ICD-10-CM | POA: Diagnosis not present

## 2015-12-07 DIAGNOSIS — I10 Essential (primary) hypertension: Secondary | ICD-10-CM | POA: Diagnosis not present

## 2015-12-07 MED ORDER — AMLODIPINE BESYLATE 2.5 MG PO TABS: 2.5000 mg | ORAL_TABLET | Freq: Every day | ORAL | Status: DC

## 2015-12-07 NOTE — Patient Instructions (Addendum)
For your blood pressure continue on the same dose amlodipine 2.5 mg po q day. Check your bp and if you see elevations like when at urologist will need to increase. But I think that may have been related to nature of visit and topic of discussion.  Follow up with urologist.   Would you make appointment with RN for medicare wellness exam in 2-3 months at your convenience. Then you will have appointment with me 2 wks or so after that appointment.

## 2015-12-07 NOTE — Progress Notes (Signed)
Pre visit review using our clinic review tool, if applicable. No additional management support is needed unless otherwise documented below in the visit note. 

## 2015-12-07 NOTE — Progress Notes (Signed)
Subjective:    Patient ID: Donald Porter, male    DOB: August 30, 1941, 74 y.o.   MRN: [id number]  HPI   Pt in for follow up. Pt did have appointment urologist Dr. Risa Grill. Pt states he feels good about what urologist stated. Pt reports normal DRE yesterday. Pt ua was negative per pt report. Pt states blood work on Tuesday psa was 11. Prior was 11.43. Has appointment in January for biopsy.   Pt blood pressure is good today. When he check his bp he is getting 130/80 on average. When he was at urologist office did have isolated systolic at [id number]. But he was at the office to disucss elevated psa.     Review of Systems  Constitutional: Negative for fever, chills, diaphoresis, activity change and fatigue.  Respiratory: Negative for cough, chest tightness and shortness of breath.   Cardiovascular: Negative for chest pain, palpitations and leg swelling.  Gastrointestinal: Negative for nausea, vomiting and abdominal pain.  Endocrine: Positive for cold intolerance.  Genitourinary: Negative for dysuria, frequency and flank pain.  Musculoskeletal: Negative for back pain, neck pain and neck stiffness.  Neurological: Negative for dizziness and headaches.  Hematological: Negative for adenopathy. Does not bruise/bleed easily.  Psychiatric/Behavioral: Negative for behavioral problems, confusion and agitation. The patient is not nervous/anxious.     Past Medical History  Diagnosis Date  . Allergy   . Asthma     1988  . GERD (gastroesophageal reflux disease)   . Hypertension   . Hyperlipidemia 04/26/2015    Social History   Social History  . Marital Status: Married    Spouse Name: N/A  . Number of Children: N/A  . Years of Education: N/A   Occupational History  . Not on file.   Social History Main Topics  . Smoking status: Never Smoker   . Smokeless tobacco: Never Used  . Alcohol Use: 0.0 oz/week    0 Standard drinks or equivalent per week  . Drug Use: Not on file  . Sexual Activity: Not  on file   Other Topics Concern  . Not on file   Social History Narrative    Past Surgical History  Procedure Laterality Date  . Hernia repair      3 surgeries  . Eye surgery      Age 19    Family History  Problem Relation Age of Onset  . Heart disease Mother   . Lymphoma Father   . Cancer Father     No Known Allergies  Current Outpatient Prescriptions on File Prior to Visit  Medication Sig Dispense Refill  . amLODipine (NORVASC) 2.5 MG tablet Take 1 tablet (2.5 mg total) by mouth daily. 30 tablet 1  . lisinopril (PRINIVIL,ZESTRIL) 20 MG tablet TAKE 1 TABLET BY MOUTH EVERY DAY 30 tablet 5  . simvastatin (ZOCOR) 20 MG tablet TAKE 1 TABLET (20 MG TOTAL) BY MOUTH AT BEDTIME. 90 tablet 0  . tamsulosin (FLOMAX) 0.4 MG CAPS capsule Take 1 capsule (0.4 mg total) by mouth daily. 30 capsule 0   No current facility-administered medications on file prior to visit.    BP 124/82 mmHg  Pulse 71  Temp(Src) 97.8 F (36.6 C) (Oral)  Ht 5\' 7"  (1.702 m)  Wt 182 lb 3.2 oz (82.645 kg)  BMI 28.53 kg/m2  SpO2 98%       Objective:   Physical Exam   General Mental Status- Alert. General Appearance- Not in acute distress.   Skin General: Color- Normal Color. Moisture-  Normal Moisture.  Chest and Lung Exam Auscultation: Breath Sounds:-Normal. CTA.  Cardiovascular Auscultation:Rythm- Regular,Rate Murmurs & Other Heart Sounds:Auscultation of the heart reveals- No Murmurs.  Abdomen Inspection:-Inspeection Normal. Palpation/Percussion:Note:No mass. Palpation and Percussion of the abdomen reveal- Non Tender, Non Distended + BS, no rebound or guarding.  Neurologic Cranial Nerve exam:- CN III-XII intact(No nystagmus), symmetric smile. Strength:- 5/5 equal and symmetric strength both upper and lower extremities.     Assessment & Plan:  For your blood pressure continue on the same dose amlodipine 2.5 mg po q day. Check your bp and if you see elevations like when at urologist  will need to increase. But I think that may have been related to nature of visit and topic of discussion.  Follow up with urologist.   Would you make appointment with RN for medicare wellness exam in 2-3 months at your convenience. Then you will have appointment with me 2 wks or so after that appointment.

## 2015-12-08 ENCOUNTER — Other Ambulatory Visit: Payer: Self-pay | Admitting: Medical

## 2016-01-15 DIAGNOSIS — H35372 Puckering of macula, left eye: Secondary | ICD-10-CM | POA: Diagnosis not present

## 2016-01-15 DIAGNOSIS — H2513 Age-related nuclear cataract, bilateral: Secondary | ICD-10-CM | POA: Diagnosis not present

## 2016-01-15 DIAGNOSIS — H348322 Tributary (branch) retinal vein occlusion, left eye, stable: Secondary | ICD-10-CM | POA: Diagnosis not present

## 2016-01-16 ENCOUNTER — Other Ambulatory Visit: Payer: Self-pay | Admitting: Medical

## 2016-01-26 DIAGNOSIS — R972 Elevated prostate specific antigen [PSA]: Secondary | ICD-10-CM | POA: Diagnosis not present

## 2016-02-02 DIAGNOSIS — Z0000 Encounter for general adult medical examination without abnormal findings: Secondary | ICD-10-CM | POA: Diagnosis not present

## 2016-02-02 DIAGNOSIS — R972 Elevated prostate specific antigen [PSA]: Secondary | ICD-10-CM | POA: Diagnosis not present

## 2016-02-02 DIAGNOSIS — R351 Nocturia: Secondary | ICD-10-CM | POA: Diagnosis not present

## 2016-02-02 DIAGNOSIS — N400 Enlarged prostate without lower urinary tract symptoms: Secondary | ICD-10-CM | POA: Diagnosis not present

## 2016-02-02 DIAGNOSIS — C61 Malignant neoplasm of prostate: Secondary | ICD-10-CM | POA: Diagnosis not present

## 2016-02-05 ENCOUNTER — Telehealth: Payer: Self-pay

## 2016-02-05 NOTE — Telephone Encounter (Signed)
Opened in error

## 2016-02-06 ENCOUNTER — Other Ambulatory Visit: Payer: Self-pay | Admitting: Medical

## 2016-02-12 DIAGNOSIS — C61 Malignant neoplasm of prostate: Secondary | ICD-10-CM | POA: Diagnosis not present

## 2016-02-12 DIAGNOSIS — Z0000 Encounter for general adult medical examination without abnormal findings: Secondary | ICD-10-CM | POA: Diagnosis not present

## 2016-02-13 ENCOUNTER — Other Ambulatory Visit: Payer: Self-pay | Admitting: Medical

## 2016-02-26 ENCOUNTER — Ambulatory Visit: Payer: PRIVATE HEALTH INSURANCE

## 2016-03-11 ENCOUNTER — Other Ambulatory Visit: Payer: Self-pay | Admitting: Medical

## 2016-03-11 ENCOUNTER — Ambulatory Visit: Payer: PRIVATE HEALTH INSURANCE | Admitting: Medical

## 2016-04-28 ENCOUNTER — Other Ambulatory Visit: Payer: Self-pay | Admitting: Medical

## 2016-05-05 ENCOUNTER — Other Ambulatory Visit: Payer: Self-pay | Admitting: Medical

## 2016-05-21 ENCOUNTER — Telehealth: Payer: Self-pay | Admitting: Medical

## 2016-05-21 NOTE — Telephone Encounter (Signed)
Refilled his norvasc. He can schedule follow up in 2-3 months.

## 2016-05-22 NOTE — Telephone Encounter (Signed)
Called pt (at both numbers) to inform of the below. No vm set up.

## 2016-05-22 NOTE — Telephone Encounter (Signed)
Please have the pt come in for an appointment in 2-3 months.

## 2016-05-27 ENCOUNTER — Other Ambulatory Visit: Payer: Self-pay | Admitting: Medical

## 2016-05-28 ENCOUNTER — Other Ambulatory Visit: Payer: Self-pay | Admitting: Medical

## 2016-05-28 DIAGNOSIS — Z85828 Personal history of other malignant neoplasm of skin: Secondary | ICD-10-CM | POA: Diagnosis not present

## 2016-05-28 DIAGNOSIS — L570 Actinic keratosis: Secondary | ICD-10-CM | POA: Diagnosis not present

## 2016-05-28 DIAGNOSIS — Z08 Encounter for follow-up examination after completed treatment for malignant neoplasm: Secondary | ICD-10-CM | POA: Diagnosis not present

## 2016-05-28 DIAGNOSIS — D2339 Other benign neoplasm of skin of other parts of face: Secondary | ICD-10-CM | POA: Diagnosis not present

## 2016-05-28 NOTE — Telephone Encounter (Signed)
Letter mailed to pt for a follow up appointment in 2 months.

## 2016-05-30 NOTE — Telephone Encounter (Signed)
Refilled his flomax

## 2016-06-04 DIAGNOSIS — C61 Malignant neoplasm of prostate: Secondary | ICD-10-CM | POA: Diagnosis not present

## 2016-06-10 DIAGNOSIS — N400 Enlarged prostate without lower urinary tract symptoms: Secondary | ICD-10-CM | POA: Diagnosis not present

## 2016-06-10 DIAGNOSIS — R351 Nocturia: Secondary | ICD-10-CM | POA: Diagnosis not present

## 2016-06-10 DIAGNOSIS — C61 Malignant neoplasm of prostate: Secondary | ICD-10-CM | POA: Diagnosis not present

## 2016-07-10 ENCOUNTER — Ambulatory Visit (INDEPENDENT_AMBULATORY_CARE_PROVIDER_SITE_OTHER): Payer: Medicare Other | Admitting: Medical

## 2016-07-10 ENCOUNTER — Encounter: Payer: Self-pay | Admitting: Medical

## 2016-07-10 VITALS — BP 130/84 | HR 74 | Temp 98.1°F | Ht 67.0 in | Wt 181.2 lb

## 2016-07-10 DIAGNOSIS — I10 Essential (primary) hypertension: Secondary | ICD-10-CM

## 2016-07-10 DIAGNOSIS — K219 Gastro-esophageal reflux disease without esophagitis: Secondary | ICD-10-CM | POA: Diagnosis not present

## 2016-07-10 DIAGNOSIS — R972 Elevated prostate specific antigen [PSA]: Secondary | ICD-10-CM | POA: Diagnosis not present

## 2016-07-10 DIAGNOSIS — Z23 Encounter for immunization: Secondary | ICD-10-CM

## 2016-07-10 DIAGNOSIS — E785 Hyperlipidemia, unspecified: Secondary | ICD-10-CM | POA: Diagnosis not present

## 2016-07-10 LAB — LIPID PANEL
CHOL/HDL RATIO: 3
Cholesterol: 181 mg/dL (ref 0–200)
HDL: 63.1 mg/dL (ref >39.00)
LDL CALC: 101 mg/dL — AB (ref 0–99)
NonHDL: 117.66
TRIGLYCERIDES: 85 mg/dL (ref 0.0–149.0)
VLDL: 17 mg/dL (ref 0.0–40.0)

## 2016-07-10 LAB — COMPREHENSIVE METABOLIC PANEL
ALBUMIN: 4.3 g/dL (ref 3.5–5.2)
ALT: 23 U/L (ref 0–53)
AST: 23 U/L (ref 0–37)
Alkaline Phosphatase: 48 U/L (ref 39–117)
BUN: 17 mg/dL (ref 6–23)
CHLORIDE: 100 meq/L (ref 96–112)
CO2: 30 mEq/L (ref 19–32)
CREATININE: 1.02 mg/dL (ref 0.40–1.50)
Calcium: 9.8 mg/dL (ref 8.4–10.5)
GFR: 75.32 mL/min (ref >60.00)
Glucose, Bld: 108 mg/dL — ABNORMAL HIGH (ref 70–99)
Potassium: 4.8 mEq/L (ref 3.5–5.1)
SODIUM: 137 meq/L (ref 135–145)
TOTAL PROTEIN: 7.9 g/dL (ref 6.0–8.3)
Total Bilirubin: 0.7 mg/dL (ref 0.2–1.2)

## 2016-07-10 NOTE — Addendum Note (Signed)
Addended by: Tasia Catchings on: 07/10/2016 09:24 AM   Modules accepted: Orders

## 2016-07-10 NOTE — Patient Instructions (Addendum)
Your htn is well controlled. Continue lisinopril and amlodipine.  For your elevated psa continue active surveillance with urologist.  For your high cholesterol will get cmp and lipid panel.  For rare mild reflux continue the pepcid and healthy diet.  Updated tdap today.  Follow up 3 months or as needed

## 2016-07-10 NOTE — Progress Notes (Signed)
Subjective:    Patient ID: Donald Porter, male    DOB: 05/26/1941, 75 y.o.   MRN: [id number]  HPI  Pt in for follow up. He gives me update on urologist appointment. I reviewed last note and discussed with the pt. He is under active surveillance. Pt does not seem overly anxious regarding this. Asked he notify us of this if anxiety becomes an issue.  Bp 130/84. No cardiac or neurologic signs or symptoms. Pt feels good. When he checks is 130/80 majority of the time. Pt on norvasc and zestril.  Pt ldl is mild high in the past. Not checked recently.  Pt also states reflux controlled with diet and occasional pepcid.   Review of Systems  Constitutional: Negative for fever, chills, activity change and fatigue.  Respiratory: Negative for cough, chest tightness and shortness of breath.   Cardiovascular: Negative for chest pain and palpitations.  Gastrointestinal: Negative for nausea, vomiting and abdominal pain.  Musculoskeletal: Negative for back pain and neck pain.  Skin: Negative for rash.  Neurological: Negative for dizziness, seizures, syncope, facial asymmetry, weakness, numbness and headaches.  Psychiatric/Behavioral: Negative for behavioral problems, confusion and agitation. The patient is not nervous/anxious.     Past Medical History  Diagnosis Date  . Allergy   . Asthma     1988  . GERD (gastroesophageal reflux disease)   . Hypertension   . Hyperlipidemia 04/26/2015     Social History   Social History  . Marital Status: Married    Spouse Name: N/A  . Number of Children: N/A  . Years of Education: N/A   Occupational History  . Not on file.   Social History Main Topics  . Smoking status: Never Smoker   . Smokeless tobacco: Never Used  . Alcohol Use: 0.0 oz/week    0 Standard drinks or equivalent per week  . Drug Use: Not on file  . Sexual Activity: Not on file   Other Topics Concern  . Not on file   Social History Narrative    Past Surgical History    Procedure Laterality Date  . Hernia repair      3 surgeries  . Eye surgery      Age 60    Family History  Problem Relation Age of Onset  . Heart disease Mother   . Lymphoma Father   . Cancer Father     No Known Allergies  Current Outpatient Prescriptions on File Prior to Visit  Medication Sig Dispense Refill  . amLODipine (NORVASC) 2.5 MG tablet TAKE 1 TABLET (2.5 MG TOTAL) BY MOUTH DAILY. 30 tablet 3  . lisinopril (PRINIVIL,ZESTRIL) 20 MG tablet TAKE 1 TABLET BY MOUTH EVERY DAY 30 tablet 5  . simvastatin (ZOCOR) 20 MG tablet TAKE 1 TABLET (20 MG TOTAL) BY MOUTH AT BEDTIME. 90 tablet 0  . tamsulosin (FLOMAX) 0.4 MG CAPS capsule TAKE 1 CAPSULE (0.4 MG TOTAL) BY MOUTH DAILY. 30 capsule 3   No current facility-administered medications on file prior to visit.    BP 130/84 mmHg  Pulse 74  Temp(Src) 98.1 F (36.7 C) (Oral)  Ht 5\' 7"  (1.702 m)  Wt 181 lb 3.2 oz (82.192 kg)  BMI 28.37 kg/m2  SpO2 98%      Objective:   Physical Exam  General Mental Status- Alert. General Appearance- Not in acute distress.    Neck Carotid Arteries- Normal color. Moisture- Normal Moisture. No carotid bruits. No JVD.  Chest and Lung Exam Auscultation: Breath Sounds:-Normal.  Cardiovascular  Auscultation:Rythm- Regular. Murmurs & Other Heart Sounds:Auscultation of the heart reveals- No Murmurs.  Abdomen Inspection:-Inspeection Normal. Palpation/Percussion:Note:No mass. Palpation and Percussion of the abdomen reveal- Non Tender, Non Distended + BS, no rebound or guarding.    Neurologic Cranial Nerve exam:- CN III-XII intact(No nystagmus), symmetric smile. Strength:- 5/5 equal and symmetric strength both upper and lower extremities.      Assessment & Plan:  Your htn is well controlled. Continue lisinopril and amlodipine.  For your elevated psa continue active surveillance with urologist.  For your high cholesterol will get cmp and lipid panel.  For rare mild reflux continue  the pepcid and healthy diet.  Updated tdap today.  Follow up 3 months or as needed   Amiel Mccaffrey, [name], [employer]

## 2016-07-10 NOTE — Progress Notes (Signed)
Pre visit review using our clinic review tool, if applicable. No additional management support is needed unless otherwise documented below in the visit note. 

## 2016-07-16 ENCOUNTER — Other Ambulatory Visit: Payer: Self-pay

## 2016-07-16 MED ORDER — SIMVASTATIN 20 MG PO TABS: ORAL_TABLET | ORAL | Status: DC

## 2016-07-16 NOTE — Telephone Encounter (Signed)
Rx for simvastatin filled 07/16/16.

## 2016-08-05 DIAGNOSIS — H348322 Tributary (branch) retinal vein occlusion, left eye, stable: Secondary | ICD-10-CM | POA: Diagnosis not present

## 2016-08-05 DIAGNOSIS — H35372 Puckering of macula, left eye: Secondary | ICD-10-CM | POA: Diagnosis not present

## 2016-08-05 DIAGNOSIS — H2513 Age-related nuclear cataract, bilateral: Secondary | ICD-10-CM | POA: Diagnosis not present

## 2016-08-15 DIAGNOSIS — Z23 Encounter for immunization: Secondary | ICD-10-CM | POA: Diagnosis not present

## 2016-09-18 ENCOUNTER — Other Ambulatory Visit: Payer: Self-pay | Admitting: Medical

## 2016-09-23 ENCOUNTER — Other Ambulatory Visit: Payer: Self-pay | Admitting: Medical

## 2016-10-09 ENCOUNTER — Ambulatory Visit: Payer: PRIVATE HEALTH INSURANCE | Admitting: Medical

## 2016-10-16 ENCOUNTER — Ambulatory Visit: Payer: PRIVATE HEALTH INSURANCE | Admitting: Medical

## 2016-10-30 ENCOUNTER — Encounter: Payer: Self-pay | Admitting: Medical

## 2016-10-30 ENCOUNTER — Telehealth: Payer: Self-pay | Admitting: Medical

## 2016-10-30 ENCOUNTER — Ambulatory Visit (INDEPENDENT_AMBULATORY_CARE_PROVIDER_SITE_OTHER): Payer: Medicare Other | Admitting: Medical

## 2016-10-30 VITALS — BP 139/89 | HR 68 | Temp 97.8°F | Ht 67.0 in | Wt 183.4 lb

## 2016-10-30 DIAGNOSIS — E875 Hyperkalemia: Secondary | ICD-10-CM

## 2016-10-30 DIAGNOSIS — E785 Hyperlipidemia, unspecified: Secondary | ICD-10-CM | POA: Diagnosis not present

## 2016-10-30 DIAGNOSIS — R972 Elevated prostate specific antigen [PSA]: Secondary | ICD-10-CM

## 2016-10-30 DIAGNOSIS — I10 Essential (primary) hypertension: Secondary | ICD-10-CM | POA: Diagnosis not present

## 2016-10-30 LAB — LIPID PANEL
CHOL/HDL RATIO: 3
Cholesterol: 189 mg/dL (ref 0–200)
HDL: 72.5 mg/dL (ref >39.00)
LDL CALC: 103 mg/dL — AB (ref 0–99)
NonHDL: 116.42
TRIGLYCERIDES: 66 mg/dL (ref 0.0–149.0)
VLDL: 13.2 mg/dL (ref 0.0–40.0)

## 2016-10-30 LAB — CBC WITH DIFFERENTIAL/PLATELET
BASOS PCT: 1.2 % (ref 0.0–3.0)
Basophils Absolute: 0.1 10*3/uL (ref 0.0–0.1)
EOS ABS: 0.3 10*3/uL (ref 0.0–0.7)
Eosinophils Relative: 3.9 % (ref 0.0–5.0)
HEMATOCRIT: 42.2 % (ref 39.0–52.0)
Hemoglobin: 14.3 g/dL (ref 13.0–17.0)
LYMPHS ABS: 1.4 10*3/uL (ref 0.7–4.0)
LYMPHS PCT: 16 % (ref 12.0–46.0)
MCHC: 33.8 g/dL (ref 30.0–36.0)
MCV: 91.4 fl (ref 78.0–100.0)
Monocytes Absolute: 1 10*3/uL (ref 0.1–1.0)
Monocytes Relative: 11.6 % (ref 3.0–12.0)
NEUTROS ABS: 5.7 10*3/uL (ref 1.4–7.7)
Neutrophils Relative %: 67.3 % (ref 43.0–77.0)
PLATELETS: 249 10*3/uL (ref 150.0–400.0)
RBC: 4.61 Mil/uL (ref 4.22–5.81)
RDW: 13.5 % (ref 11.5–15.5)
WBC: 8.5 10*3/uL (ref 4.0–10.5)

## 2016-10-30 LAB — COMPREHENSIVE METABOLIC PANEL
ALT: 20 U/L (ref 0–53)
AST: 20 U/L (ref 0–37)
Albumin: 4.2 g/dL (ref 3.5–5.2)
Alkaline Phosphatase: 48 U/L (ref 39–117)
BUN: 20 mg/dL (ref 6–23)
CALCIUM: 9.8 mg/dL (ref 8.4–10.5)
CHLORIDE: 100 meq/L (ref 96–112)
CO2: 31 meq/L (ref 19–32)
Creatinine, Ser: 0.98 mg/dL (ref 0.40–1.50)
GFR: 78.81 mL/min (ref >60.00)
Glucose, Bld: 101 mg/dL — ABNORMAL HIGH (ref 70–99)
Potassium: 5.2 mEq/L — ABNORMAL HIGH (ref 3.5–5.1)
Sodium: 137 mEq/L (ref 135–145)
Total Bilirubin: 0.9 mg/dL (ref 0.2–1.2)
Total Protein: 7.5 g/dL (ref 6.0–8.3)

## 2016-10-30 MED ORDER — AMLODIPINE BESYLATE 5 MG PO TABS: 5.0000 mg | ORAL_TABLET | Freq: Every day | ORAL | 0 refills | Status: DC

## 2016-10-30 NOTE — Telephone Encounter (Signed)
Future cmp placed to get done in one week to recheck slight high k level recently. Pt can just go to lab directly. Does not need to see me.

## 2016-10-30 NOTE — Progress Notes (Signed)
Subjective:    Patient ID: Donald Porter, male    DOB: [birth year], 75 y.o.   MRN: [id number]  HPI   Pt in for follow up.  Pt bp is good today. Less than 140/90. He is not checking his bp often. 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   Pt has hyperlipidemia. He is on zocor and his lipids looked good last check.   No cardiac or neuruologic signs or symptoms.  Pt is under active surveillance for elevated psa and prostate ca. Very early. Pt sees urologist and most recently his psa did drop. Pt is on flomax.  [facility].    Review of Systems  Constitutional: Negative for chills, fatigue and fever.  HENT: Negative for congestion and sinus pressure.   Respiratory: Negative for cough, chest tightness and shortness of breath.   Cardiovascular: Negative for chest pain and palpitations.  Gastrointestinal: Negative for abdominal pain, constipation and nausea.  Genitourinary: Negative for discharge, dysuria, flank pain, frequency and testicular pain.  Musculoskeletal: Negative for back pain.  Skin: Negative for rash.  Neurological: Negative for dizziness, numbness and headaches.  Hematological: Negative for adenopathy. Does not bruise/bleed easily.  Psychiatric/Behavioral: Negative for behavioral problems and confusion.    Past Medical History:  Diagnosis Date  . Allergy   . Asthma    1988  . GERD (gastroesophageal reflux disease)   . Hyperlipidemia 04/26/2015  . Hypertension      Social History   Social History  . Marital status: Married    Spouse name: N/A  . Number of children: N/A  . Years of education: N/A   Occupational History  . Not on file.   Social History Main Topics  . Smoking status: Never Smoker  . Smokeless tobacco: Never Used  . Alcohol use 0.0 oz/week  . Drug use: Unknown  . Sexual activity: Not on file   Other Topics Concern  . Not on file   Social History Narrative  . No narrative on file    Past Surgical  History:  Procedure Laterality Date  . EYE SURGERY     Age 19  . HERNIA REPAIR     3 surgeries    Family History  Problem Relation Age of Onset  . Heart disease Mother   . Lymphoma Father   . Cancer Father     No Known Allergies  Current Outpatient Prescriptions on File Prior to Visit  Medication Sig Dispense Refill  . amLODipine (NORVASC) 2.5 MG tablet TAKE 1 TABLET (2.5 MG TOTAL) BY MOUTH DAILY. 30 tablet 3  . finasteride (PROSCAR) 5 MG tablet Take 5 mg by mouth daily.  11  . lisinopril (PRINIVIL,ZESTRIL) 20 MG tablet TAKE 1 TABLET BY MOUTH EVERY DAY 30 tablet 5  . simvastatin (ZOCOR) 20 MG tablet TAKE 1 TABLET (20 MG TOTAL) BY MOUTH AT BEDTIME. 90 tablet 1  . tamsulosin (FLOMAX) 0.4 MG CAPS capsule TAKE 1 CAPSULE (0.4 MG TOTAL) BY MOUTH DAILY. 30 capsule 2   No current facility-administered medications on file prior to visit.     BP 138/86 (BP Location: Right Arm, Patient Position: Sitting)   Pulse 68   Temp 97.8 F (36.6 C) (Oral)   Ht 5\' 7"  (1.702 m)   Wt 183 lb 6.4 oz (83.2 kg)   SpO2 98%   BMI 28.72 kg/m       Objective:   Physical Exam  General Mental Status- Alert. General  Appearance- Not in acute distress.   Skin General: Color- Normal Color. Moisture- Normal Moisture.  Neck Carotid Arteries- Normal color. Moisture- Normal Moisture. No carotid bruits. No JVD.  Chest and Lung Exam Auscultation: Breath Sounds:-Normal.  Cardiovascular Auscultation:Rythm- Regular. Murmurs & Other Heart Sounds:Auscultation of the heart reveals- No Murmurs.  Abdomen Inspection:-Inspeection Normal. Palpation/Percussion:Note:No mass. Palpation and Percussion of the abdomen reveal- Non Tender, Non Distended + BS, no rebound or guarding.   Neurologic Cranial Nerve exam:- CN III-XII intact(No nystagmus), symmetric smile. Strength:- 5/5 equal and symmetric strength both upper and lower extremities.      Assessment & Plan:  For your htn will continue the zestril 20  mg tab but increasing your amlodipine to 5 mg a day. Check your bp and make sure it is well controlled/less than 140/90. Call me or my chart in one month. If bp controlled will refill at 5 mg. If not well controlled then increase to 10 mg tab at that time.  When your bp is well controlled will give 90 tabs with refill.   For your cholesterol repeat lipid panel today. Also get cbc and cmp.  Continue to follow up with your urologist for elevated psa.  Follow up in 3 months or as needed  Ezabella Teska, [name], [employer]

## 2016-10-30 NOTE — Patient Instructions (Addendum)
For your htn will continue the zestril 20 mg tab but increasing your amlodipine to 5 mg a day. Check your bp and make sure it is well controlled/less than 140/90. Call me or my chart in one month. If bp controlled will refill at 5 mg. If not well controlled then increase to 10 mg tab at that time.   When your bp is well controlled will give 90 tabs with refill.   For your cholesterol repeat lipid panel today. Also get cbc and cmp.  Continue to follow up with your urologist for elevated psa.   Follow up in 3 months or as needed

## 2016-10-30 NOTE — Progress Notes (Signed)
Pre visit review using our clinic review tool, if applicable. No additional management support is needed unless otherwise documented below in the visit note. 

## 2016-11-13 ENCOUNTER — Other Ambulatory Visit (INDEPENDENT_AMBULATORY_CARE_PROVIDER_SITE_OTHER): Payer: Medicare Other

## 2016-11-13 DIAGNOSIS — E875 Hyperkalemia: Secondary | ICD-10-CM

## 2016-11-13 LAB — COMPLETE METABOLIC PANEL WITH GFR
ALBUMIN: 4 g/dL (ref 3.6–5.1)
ALK PHOS: 42 U/L (ref 40–115)
ALT: 20 U/L (ref 9–46)
AST: 21 U/L (ref 10–35)
BUN: 21 mg/dL (ref 7–25)
CALCIUM: 9.2 mg/dL (ref 8.6–10.3)
CHLORIDE: 99 mmol/L (ref 98–110)
CO2: 31 mmol/L (ref 20–31)
CREATININE: 0.97 mg/dL (ref 0.70–1.18)
GFR, Est African American: 87 mL/min (ref >=60)
GFR, Est Non African American: 75 mL/min (ref >=60)
GLUCOSE: 103 mg/dL — AB (ref 65–99)
POTASSIUM: 4.9 mmol/L (ref 3.5–5.3)
SODIUM: 135 mmol/L (ref 135–146)
Total Bilirubin: 0.7 mg/dL (ref 0.2–1.2)
Total Protein: 7 g/dL (ref 6.1–8.1)

## 2016-11-21 ENCOUNTER — Other Ambulatory Visit: Payer: Self-pay | Admitting: Medical

## 2016-11-26 DIAGNOSIS — L821 Other seborrheic keratosis: Secondary | ICD-10-CM | POA: Diagnosis not present

## 2016-11-26 DIAGNOSIS — Z85828 Personal history of other malignant neoplasm of skin: Secondary | ICD-10-CM | POA: Diagnosis not present

## 2016-11-26 DIAGNOSIS — Z08 Encounter for follow-up examination after completed treatment for malignant neoplasm: Secondary | ICD-10-CM | POA: Diagnosis not present

## 2016-11-26 DIAGNOSIS — L570 Actinic keratosis: Secondary | ICD-10-CM | POA: Diagnosis not present

## 2016-11-26 NOTE — Telephone Encounter (Signed)
Medication filled to pharmacy as requested.   

## 2016-11-27 ENCOUNTER — Other Ambulatory Visit: Payer: Self-pay | Admitting: Medical

## 2016-12-02 DIAGNOSIS — C61 Malignant neoplasm of prostate: Secondary | ICD-10-CM | POA: Diagnosis not present

## 2016-12-06 ENCOUNTER — Other Ambulatory Visit: Payer: Self-pay

## 2016-12-06 DIAGNOSIS — C61 Malignant neoplasm of prostate: Secondary | ICD-10-CM | POA: Diagnosis not present

## 2016-12-06 DIAGNOSIS — R351 Nocturia: Secondary | ICD-10-CM | POA: Diagnosis not present

## 2016-12-06 DIAGNOSIS — N400 Enlarged prostate without lower urinary tract symptoms: Secondary | ICD-10-CM | POA: Diagnosis not present

## 2016-12-12 ENCOUNTER — Telehealth: Payer: Self-pay | Admitting: Medical

## 2016-12-12 NOTE — Telephone Encounter (Signed)
Spoke with patient in regards to scheduling annual wellness appointment. Patient stated that he has other health issues going on right now that he would like to take care of first before he schedules an annual wellness visit. Informed patient that he can give office a call when he is ready.

## 2016-12-18 ENCOUNTER — Other Ambulatory Visit: Payer: Self-pay | Admitting: Medical

## 2017-01-23 ENCOUNTER — Other Ambulatory Visit: Payer: Self-pay | Admitting: Medical

## 2017-02-05 ENCOUNTER — Ambulatory Visit (INDEPENDENT_AMBULATORY_CARE_PROVIDER_SITE_OTHER): Payer: Medicare Other | Admitting: Medical

## 2017-02-05 ENCOUNTER — Ambulatory Visit: Payer: PRIVATE HEALTH INSURANCE | Admitting: Medical

## 2017-02-05 ENCOUNTER — Encounter: Payer: Self-pay | Admitting: Medical

## 2017-02-05 VITALS — BP 124/58 | HR 72 | Temp 97.7°F | Resp 16 | Ht 67.0 in | Wt 183.0 lb

## 2017-02-05 DIAGNOSIS — Z23 Encounter for immunization: Secondary | ICD-10-CM

## 2017-02-05 DIAGNOSIS — R972 Elevated prostate specific antigen [PSA]: Secondary | ICD-10-CM | POA: Diagnosis not present

## 2017-02-05 DIAGNOSIS — E785 Hyperlipidemia, unspecified: Secondary | ICD-10-CM | POA: Diagnosis not present

## 2017-02-05 DIAGNOSIS — I10 Essential (primary) hypertension: Secondary | ICD-10-CM

## 2017-02-05 DIAGNOSIS — E875 Hyperkalemia: Secondary | ICD-10-CM

## 2017-02-05 LAB — LIPID PANEL
CHOL/HDL RATIO: 3
Cholesterol: 173 mg/dL (ref 0–200)
HDL: 63.5 mg/dL (ref >39.00)
LDL Cholesterol: 94 mg/dL (ref 0–99)
NONHDL: 109.64
TRIGLYCERIDES: 76 mg/dL (ref 0.0–149.0)
VLDL: 15.2 mg/dL (ref 0.0–40.0)

## 2017-02-05 LAB — COMPLETE METABOLIC PANEL WITH GFR
ALT: 21 U/L (ref 9–46)
AST: 21 U/L (ref 10–35)
Albumin: 4.2 g/dL (ref 3.6–5.1)
Alkaline Phosphatase: 45 U/L (ref 40–115)
BUN: 24 mg/dL (ref 7–25)
CHLORIDE: 102 mmol/L (ref 98–110)
CO2: 30 mmol/L (ref 20–31)
CREATININE: 1.23 mg/dL — AB (ref 0.70–1.18)
Calcium: 9.5 mg/dL (ref 8.6–10.3)
GFR, EST AFRICAN AMERICAN: 65 mL/min (ref >=60)
GFR, Est Non African American: 56 mL/min — ABNORMAL LOW (ref >=60)
GLUCOSE: 100 mg/dL — AB (ref 65–99)
Potassium: 5.2 mmol/L (ref 3.5–5.3)
SODIUM: 136 mmol/L (ref 135–146)
Total Bilirubin: 0.8 mg/dL (ref 0.2–1.2)
Total Protein: 7.1 g/dL (ref 6.1–8.1)

## 2017-02-05 NOTE — Patient Instructions (Addendum)
Your blood pressure is well controlled. Continue on your current bp meds.  For your high potassium history will check cmp today when lipid panel done.(continue your zocor).  Get biopsy done with Urologist.  Pneumo vaccine update.   Follow up in 3 months or as needed  IAbdomen faint soreness may be muscle pain from lifting. If constant pain, increase pain or abd bulge let us know.

## 2017-02-05 NOTE — Progress Notes (Signed)
Subjective:    Patient ID: Donald Porter, male    DOB: 1941-10-05, 76 y.o.   MRN: [id number]  HPI  Pt in for check up.   BP is well controlled. No cardiac or neurologic signs or symptoms.(Lisinopril and amlodipine) Pt wife recently hospitalized. She was recently discharged.    Back in November pt ldl was mild elevated. Pt is on zocor.   Pt offered psv 13 today. Looks like just had psv 23 in past.  Pt has biopsy prostate at  end of March. Pt urinating ok with flomax. PSA was elevated in the past. Pt thinks last psa had dropped to around 8.     Review of Systems  Constitutional: Negative for chills and fatigue.  Respiratory: Negative for cough, chest tightness, shortness of breath and wheezing.   Cardiovascular: Negative for chest pain and palpitations.  Gastrointestinal: Negative for abdominal distention, abdominal pain, blood in stool, constipation, diarrhea, nausea and vomiting.       Faint soreness lower abd when lifts heavy things. When transferring wife.   Genitourinary: Negative for decreased urine volume, dysuria, flank pain, frequency, hematuria, penile pain and testicular pain.  Musculoskeletal: Negative for back pain.  Skin: Negative for pallor and rash.  Neurological: Negative for dizziness, seizures, syncope, weakness, numbness and headaches.  Hematological: Negative for adenopathy. Does not bruise/bleed easily.  Psychiatric/Behavioral: Negative for behavioral problems, confusion and hallucinations.    Past Medical History:  Diagnosis Date  . Allergy   . Asthma    1988  . GERD (gastroesophageal reflux disease)   . Hyperlipidemia 04/26/2015  . Hypertension      Social History   Social History  . Marital status: Married    Spouse name: N/A  . Number of children: N/A  . Years of education: N/A   Occupational History  . Not on file.   Social History Main Topics  . Smoking status: Never Smoker  . Smokeless tobacco: Never Used  . Alcohol use 0.0 oz/week   . Drug use: Unknown  . Sexual activity: Not on file   Other Topics Concern  . Not on file   Social History Narrative  . No narrative on file    Past Surgical History:  Procedure Laterality Date  . EYE SURGERY     Age 39  . HERNIA REPAIR     3 surgeries    Family History  Problem Relation Age of Onset  . Heart disease Mother   . Lymphoma Father   . Cancer Father     No Known Allergies  Current Outpatient Prescriptions on File Prior to Visit  Medication Sig Dispense Refill  . amLODipine (NORVASC) 5 MG tablet TAKE 1 TABLET BY MOUTH EVERY DAY 30 tablet 0  . finasteride (PROSCAR) 5 MG tablet Take 5 mg by mouth daily.  11  . lisinopril (PRINIVIL,ZESTRIL) 20 MG tablet TAKE 1 TABLET BY MOUTH EVERY DAY 90 tablet 1  . simvastatin (ZOCOR) 20 MG tablet TAKE 1 TABLET (20 MG TOTAL) BY MOUTH AT BEDTIME. 90 tablet 1  . tamsulosin (FLOMAX) 0.4 MG CAPS capsule TAKE 1 CAPSULE (0.4 MG TOTAL) BY MOUTH DAILY. 30 capsule 2   No current facility-administered medications on file prior to visit.     BP (!) 124/58 (BP Location: Right Arm, Patient Position: Sitting, Cuff Size: Large)   Pulse 72   Temp 97.7 F (36.5 C) (Oral)   Resp 16   Ht 5\' 7"  (1.702 m)   Wt 183 lb (83 kg)  SpO2 99%   BMI 28.66 kg/m       Objective:   Physical Exam  General Mental Status- Alert. General Appearance- Not in acute distress.   Skin General: Color- Normal Color. Moisture- Normal Moisture.  Neck Carotid Arteries- Normal color. Moisture- Normal Moisture. No carotid bruits. No JVD.  Chest and Lung Exam Auscultation: Breath Sounds:-Normal.  Cardiovascular Auscultation:Rythm- Regular. Murmurs & Other Heart Sounds:Auscultation of the heart reveals- No Murmurs.  Abdomen Inspection:-Inspeection Normal. Palpation/Percussion:Note:No mass. Palpation and Percussion of the abdomen reveal- Non Tender, Non Distended + BS, no rebound or guarding.    Neurologic Cranial Nerve exam:- CN III-XII  intact(No nystagmus), symmetric smile. Strength:- 5/5 equal and symmetric strength both upper and lower extremities.      Assessment & Plan:  Your blood pressure is well controlled. Continue on your current bp meds.  For your high potassium history will check cmp today when lipid panel done.(continue your zocor).  Get biopsy done with Urologist.  Pneumo vaccine update.   Follow up in 3 months or as needed  Counseled pt on abdomen faint soreness may be muscle pain from lifting. If constant pain, increase pain or abd bulge let us know.

## 2017-02-05 NOTE — Progress Notes (Signed)
Pre visit review using our clinic review tool, if applicable. No additional management support is needed unless otherwise documented below in the visit note/SLS  

## 2017-02-05 NOTE — Addendum Note (Signed)
Addended by: Eduard Roux E on: 02/05/2017 11:17 AM   Modules accepted: Orders

## 2017-02-21 ENCOUNTER — Other Ambulatory Visit: Payer: Self-pay | Admitting: Medical

## 2017-02-25 ENCOUNTER — Telehealth: Payer: Self-pay | Admitting: Medical

## 2017-02-25 NOTE — Telephone Encounter (Signed)
Patient scheduled AWV with Glenard Haring 05/07/17 at 9am and follow up with PCP at 10:15am

## 2017-03-05 ENCOUNTER — Other Ambulatory Visit: Payer: Self-pay | Admitting: Medical

## 2017-03-11 ENCOUNTER — Other Ambulatory Visit: Payer: Self-pay | Admitting: *Deleted

## 2017-03-11 MED ORDER — SIMVASTATIN 20 MG PO TABS: ORAL_TABLET | ORAL | 1 refills | Status: DC

## 2017-03-11 NOTE — Progress Notes (Signed)
Pre visit review using our clinic review tool, if applicable. No additional management support is needed unless otherwise documented below in the visit note/SLS  

## 2017-03-21 ENCOUNTER — Other Ambulatory Visit: Payer: Self-pay | Admitting: Medical

## 2017-03-21 DIAGNOSIS — H53032 Strabismic amblyopia, left eye: Secondary | ICD-10-CM | POA: Diagnosis not present

## 2017-03-21 DIAGNOSIS — H348322 Tributary (branch) retinal vein occlusion, left eye, stable: Secondary | ICD-10-CM | POA: Diagnosis not present

## 2017-03-21 DIAGNOSIS — H2513 Age-related nuclear cataract, bilateral: Secondary | ICD-10-CM | POA: Diagnosis not present

## 2017-03-21 DIAGNOSIS — H35372 Puckering of macula, left eye: Secondary | ICD-10-CM | POA: Diagnosis not present

## 2017-03-26 ENCOUNTER — Telehealth: Payer: Self-pay | Admitting: Medical

## 2017-03-26 DIAGNOSIS — N4289 Other specified disorders of prostate: Secondary | ICD-10-CM | POA: Diagnosis not present

## 2017-03-26 DIAGNOSIS — C61 Malignant neoplasm of prostate: Secondary | ICD-10-CM | POA: Diagnosis not present

## 2017-03-26 MED ORDER — TAMSULOSIN HCL 0.4 MG PO CAPS: ORAL_CAPSULE | ORAL | 2 refills | Status: DC

## 2017-03-26 MED ORDER — AMLODIPINE BESYLATE 5 MG PO TABS: 5.0000 mg | ORAL_TABLET | Freq: Every day | ORAL | 3 refills | Status: DC

## 2017-03-26 NOTE — Telephone Encounter (Signed)
Relation to VO:UZHQ Call back number:[phone] Pharmacy: [id number] [place], [place]  Reason for call:  Patient requesting a refill amLODipine (NORVASC) 5 MG tablet and tamsulosin (FLOMAX) 0.4 MG CAPS capsule, patient states he is going out of town Saturday and he's completely out,please advise

## 2017-03-26 NOTE — Telephone Encounter (Signed)
rx norvasc and flomax prescription sent to his pharmacy.

## 2017-04-07 DIAGNOSIS — C61 Malignant neoplasm of prostate: Secondary | ICD-10-CM | POA: Diagnosis not present

## 2017-05-06 DIAGNOSIS — H348322 Tributary (branch) retinal vein occlusion, left eye, stable: Secondary | ICD-10-CM | POA: Diagnosis not present

## 2017-05-06 DIAGNOSIS — H43813 Vitreous degeneration, bilateral: Secondary | ICD-10-CM | POA: Diagnosis not present

## 2017-05-06 DIAGNOSIS — H35372 Puckering of macula, left eye: Secondary | ICD-10-CM | POA: Diagnosis not present

## 2017-05-06 DIAGNOSIS — H35031 Hypertensive retinopathy, right eye: Secondary | ICD-10-CM | POA: Diagnosis not present

## 2017-05-06 NOTE — Progress Notes (Signed)
Pre visit review using our clinic review tool, if applicable. No additional management support is needed unless otherwise documented below in the visit note. 

## 2017-05-06 NOTE — Telephone Encounter (Signed)
No answer

## 2017-05-06 NOTE — Progress Notes (Deleted)
Subjective:   Donald Porter is a 76 y.o. male who presents for an Initial Medicare Annual Wellness Visit.  Review of Systems  No ROS.  Medicare Wellness Visit.    Sleep patterns:    Home Safety/Smoke Alarms:   Living environment; residence and Firearm Safety:  Seat Belt Safety/Bike Helmet: Wears seat belt.   Counseling:   Eye Exam-  Dental-  Male:   CCS-     PSA-  Lab Results  Component Value Date   PSA 11.43 (H) 11/09/2015      Objective:    There were no vitals filed for this visit. There is no height or weight on file to calculate BMI.  Current Medications (verified) Outpatient Encounter Prescriptions as of 05/07/2017  Medication Sig  . amLODipine (NORVASC) 5 MG tablet TAKE 1 TABLET BY MOUTH EVERY DAY  . amLODipine (NORVASC) 5 MG tablet Take 1 tablet (5 mg total) by mouth daily.  . finasteride (PROSCAR) 5 MG tablet Take 5 mg by mouth daily.  Marland Kitchen lisinopril (PRINIVIL,ZESTRIL) 20 MG tablet TAKE 1 TABLET BY MOUTH EVERY DAY  . Multiple Vitamins-Minerals (MENS MULTIVITAMIN PLUS) TABS Take by mouth daily.  . simvastatin (ZOCOR) 20 MG tablet TAKE 1 TABLET (20 MG TOTAL) BY MOUTH AT BEDTIME.  . tamsulosin (FLOMAX) 0.4 MG CAPS capsule TAKE 1 CAPSULE (0.4 MG TOTAL) BY MOUTH DAILY.  . tamsulosin (FLOMAX) 0.4 MG CAPS capsule TAKE 1 CAPSULE (0.4 MG TOTAL) BY MOUTH DAILY.   No facility-administered encounter medications on file as of 05/07/2017.     Allergies (verified) Patient has no known allergies.   History: Past Medical History:  Diagnosis Date  . Allergy   . Asthma    1988  . GERD (gastroesophageal reflux disease)   . Hyperlipidemia 04/26/2015  . Hypertension    Past Surgical History:  Procedure Laterality Date  . EYE SURGERY     Age 45  . HERNIA REPAIR     3 surgeries   Family History  Problem Relation Age of Onset  . Heart disease Mother   . Lymphoma Father   . Cancer Father    Social History   Occupational History  . Not on file.   Social History  Main Topics  . Smoking status: Never Smoker  . Smokeless tobacco: Never Used  . Alcohol use 0.0 oz/week  . Drug use: Unknown  . Sexual activity: Not on file   Tobacco Counseling Counseling given: Not Answered   Activities of Daily Living In your present state of health, do you have any difficulty performing the following activities: 02/05/2017  Hearing? N  Vision? Y  Difficulty concentrating or making decisions? N  Walking or climbing stairs? N  Dressing or bathing? N  Doing errands, shopping? N  Some recent data might be hidden    Immunizations and Health Maintenance Immunization History  Administered Date(s) Administered  . Influenza,inj,Quad PF,36+ Mos 11/09/2015  . Influenza-Unspecified 09/18/2016  . Pneumococcal Conjugate-13 02/05/2017  . Pneumococcal Polysaccharide-23 12/07/2015  . Tdap 07/10/2016  . Zoster 11/09/2015   There are no preventive care reminders to display for this patient.  Patient Care Team: [name], Iris Pert as PCP - General (Physician Assistant)  Indicate any recent Medical Services you may have received from other than [facility] providers in the past year (date may be approximate).    Assessment:   This is a routine wellness examination for Donald Porter. ***  Hearing/Vision screen No exam data present  Dietary issues and exercise activities discussed:  Goals    None     Depression Screen PHQ 2/9 Scores 02/05/2017 11/09/2015 11/08/2014  PHQ - 2 Score 0 0 0    Fall Risk Fall Risk  02/05/2017 12/06/2016 11/09/2015 11/08/2014  Falls in the past year? No No No No    Cognitive Function:        Screening Tests Health Maintenance  Topic Date Due  . INFLUENZA VACCINE  07/30/2017  . TETANUS/TDAP  07/10/2026  . PNA vac Low Risk Adult  Completed        Plan:   ***  I have personally reviewed and noted the following in the patient's chart:   . Medical and social history . Use of alcohol, tobacco or illicit drugs  . Current medications  and supplements . Functional ability and status . Nutritional status . Physical activity . Advanced directives . List of other physicians . Hospitalizations, surgeries, and ER visits in previous 12 months . Vitals . Screenings to include cognitive, depression, and falls . Referrals and appointments  In addition, I have reviewed and discussed with patient certain preventive protocols, quality metrics, and best practice recommendations. A written personalized care plan for preventive services as well as general preventive health recommendations were provided to patient.     Donald Porter, [place]   05/06/2017    Small bump on his rt forearm. Non-tender. Appointment scheduled with dermatologist. It is May 20, 2017. If area changes before then or painful please let us know.  Some weight loss after his wife passed away. Emotionally he feels like he was depressed after his wife passed. He feels like he does not need medication.  Some allergies about 2 weeks ago. He was wheezing briefly after allergy flare. He has history of asthma in past associated with allergies. Today rx xyzal, flonase and albuterol(to use if needed). Recently stable.

## 2017-05-07 ENCOUNTER — Encounter: Payer: Self-pay | Admitting: Medical

## 2017-05-07 ENCOUNTER — Ambulatory Visit (INDEPENDENT_AMBULATORY_CARE_PROVIDER_SITE_OTHER): Payer: Medicare Other | Admitting: Medical

## 2017-05-07 VITALS — BP 106/65 | HR 69 | Temp 98.0°F | Resp 16 | Ht 67.0 in | Wt 173.8 lb

## 2017-05-07 DIAGNOSIS — J301 Allergic rhinitis due to pollen: Secondary | ICD-10-CM

## 2017-05-07 MED ORDER — ALBUTEROL SULFATE HFA 108 (90 BASE) MCG/ACT IN AERS: 2.0000 | INHALATION_SPRAY | Freq: Four times a day (QID) | RESPIRATORY_TRACT | 0 refills | Status: DC | PRN

## 2017-05-07 MED ORDER — LEVOCETIRIZINE DIHYDROCHLORIDE 5 MG PO TABS: 5.0000 mg | ORAL_TABLET | Freq: Every evening | ORAL | 0 refills | Status: DC

## 2017-05-07 MED ORDER — FLUTICASONE PROPIONATE 50 MCG/ACT NA SUSP: 2.0000 | Freq: Every day | NASAL | 1 refills | Status: DC

## 2017-05-07 NOTE — Progress Notes (Signed)
Pre visit review using our clinic review tool, if applicable. No additional management support is needed unless otherwise documented below in the visit note. 

## 2017-05-07 NOTE — Patient Instructions (Addendum)
Small bump on his rt forearm. Non-tender. Appointment scheduled with dermatologist. It is May 20, 2017. If area changes before then or painful please let us know.  Regarding wife passing, if your mood worsens we could give antidepressant type med. Please let us know if your mood worsens.  Some allergies about 2 weeks ago and  wheezing briefly after allergy flare. Today rx xyzal, flonase and albuterol(to use if needed). Recently stable.  Follow up in 3 months or as needed  Pt came in for wellness and I reviewed wellness. Brief discussion on above. Decided only to charge wellness exam.  Informed by Naaman Plummer that pt did not come in for wellness visit. When I saw him I mentioned the wellness and apologized that he had to see me afterwards. He did not correct me stating he did not have exam. So wellness charge should be removed. On review of his level of service charge I don't see presently wellness charge.

## 2017-05-08 ENCOUNTER — Telehealth: Payer: Self-pay | Admitting: Medical

## 2017-05-08 NOTE — Telephone Encounter (Signed)
Epic states unfinished variable on portion of the note you wrote. Can you finish that and let me know. Then I will sign off on chart.

## 2017-05-15 ENCOUNTER — Encounter: Payer: Self-pay | Admitting: *Deleted

## 2017-05-16 NOTE — Telephone Encounter (Signed)
This encounter was created in error - please disregard.

## 2017-05-17 ENCOUNTER — Other Ambulatory Visit: Payer: Self-pay | Admitting: Medical

## 2017-05-20 DIAGNOSIS — L570 Actinic keratosis: Secondary | ICD-10-CM | POA: Diagnosis not present

## 2017-05-20 DIAGNOSIS — Z85828 Personal history of other malignant neoplasm of skin: Secondary | ICD-10-CM | POA: Diagnosis not present

## 2017-05-20 DIAGNOSIS — Z08 Encounter for follow-up examination after completed treatment for malignant neoplasm: Secondary | ICD-10-CM | POA: Diagnosis not present

## 2017-05-20 DIAGNOSIS — L821 Other seborrheic keratosis: Secondary | ICD-10-CM | POA: Diagnosis not present

## 2017-06-04 ENCOUNTER — Other Ambulatory Visit: Payer: Self-pay | Admitting: Medical

## 2017-06-22 ENCOUNTER — Other Ambulatory Visit: Payer: Self-pay | Admitting: Medical

## 2017-06-24 MED ORDER — AMLODIPINE BESYLATE 5 MG PO TABS: 5.0000 mg | ORAL_TABLET | Freq: Every day | ORAL | 1 refills | Status: DC

## 2017-06-24 NOTE — Addendum Note (Signed)
Addended by: Hinton Dyer on: 06/24/2017 12:59 PM   Modules accepted: Orders

## 2017-06-25 ENCOUNTER — Other Ambulatory Visit: Payer: Self-pay | Admitting: Medical

## 2017-07-10 DIAGNOSIS — H35372 Puckering of macula, left eye: Secondary | ICD-10-CM | POA: Diagnosis not present

## 2017-07-10 DIAGNOSIS — H25013 Cortical age-related cataract, bilateral: Secondary | ICD-10-CM | POA: Diagnosis not present

## 2017-07-10 DIAGNOSIS — H25043 Posterior subcapsular polar age-related cataract, bilateral: Secondary | ICD-10-CM | POA: Diagnosis not present

## 2017-07-10 DIAGNOSIS — H02839 Dermatochalasis of unspecified eye, unspecified eyelid: Secondary | ICD-10-CM | POA: Diagnosis not present

## 2017-07-10 DIAGNOSIS — H2513 Age-related nuclear cataract, bilateral: Secondary | ICD-10-CM | POA: Diagnosis not present

## 2017-07-10 DIAGNOSIS — H2512 Age-related nuclear cataract, left eye: Secondary | ICD-10-CM | POA: Diagnosis not present

## 2017-08-05 ENCOUNTER — Ambulatory Visit: Payer: PRIVATE HEALTH INSURANCE | Admitting: Medical

## 2017-08-07 ENCOUNTER — Ambulatory Visit (INDEPENDENT_AMBULATORY_CARE_PROVIDER_SITE_OTHER): Payer: Medicare Other | Admitting: Medical

## 2017-08-07 VITALS — BP 125/62 | HR 67 | Temp 97.7°F | Resp 16 | Ht 67.0 in | Wt 179.2 lb

## 2017-08-07 DIAGNOSIS — I10 Essential (primary) hypertension: Secondary | ICD-10-CM | POA: Diagnosis not present

## 2017-08-07 DIAGNOSIS — R972 Elevated prostate specific antigen [PSA]: Secondary | ICD-10-CM

## 2017-08-07 DIAGNOSIS — E785 Hyperlipidemia, unspecified: Secondary | ICD-10-CM

## 2017-08-07 LAB — LIPID PANEL
CHOL/HDL RATIO: 2
CHOLESTEROL: 165 mg/dL (ref 0–200)
HDL: 67.5 mg/dL (ref >39.00)
LDL CALC: 85 mg/dL (ref 0–99)
NonHDL: 97.38
TRIGLYCERIDES: 63 mg/dL (ref 0.0–149.0)
VLDL: 12.6 mg/dL (ref 0.0–40.0)

## 2017-08-07 LAB — COMPREHENSIVE METABOLIC PANEL
ALT: 17 U/L (ref 0–53)
AST: 19 U/L (ref 0–37)
Albumin: 4.4 g/dL (ref 3.5–5.2)
Alkaline Phosphatase: 45 U/L (ref 39–117)
BUN: 20 mg/dL (ref 6–23)
CALCIUM: 9.4 mg/dL (ref 8.4–10.5)
CO2: 32 meq/L (ref 19–32)
Chloride: 91 mEq/L — ABNORMAL LOW (ref 96–112)
Creatinine, Ser: 1.04 mg/dL (ref 0.40–1.50)
GFR: 73.44 mL/min (ref >60.00)
Glucose, Bld: 100 mg/dL — ABNORMAL HIGH (ref 70–99)
Potassium: 5.3 mEq/L — ABNORMAL HIGH (ref 3.5–5.1)
SODIUM: 127 meq/L — AB (ref 135–145)
TOTAL PROTEIN: 7.4 g/dL (ref 6.0–8.3)
Total Bilirubin: 1 mg/dL (ref 0.2–1.2)

## 2017-08-07 LAB — PSA: PSA: 7.28 ng/mL — ABNORMAL HIGH (ref 0.10–4.00)

## 2017-08-07 NOTE — Progress Notes (Signed)
Subjective:    Patient ID: [name], male    DOB: 1941/11/08, 76 y.o.   MRN: [id number]  HPI  Pt in for follow up.  He updates me as he did last time. He lost wife in spring. He has upcoming cataract surgery September 5,2018. Also has irregularity to retina.(needs to correct cataract before retina)  BP is well controlled. No cardiac or neurologic signs or symptoms.(Lisinopril and amlodipine)    Back in November pt ldl was mild elevated. Pt is on zocor.    Pt has biopsy prostate at  end of March. Pt urinating ok with flomax. PSA was elevated in the past. Pt thinks last psa had dropped to around 8.   Review of Systems  Constitutional: Negative for chills, fatigue and fever.  HENT: Negative for congestion, ear pain and sinus pressure.   Respiratory: Negative for cough, chest tightness, shortness of breath and wheezing.   Cardiovascular: Negative for chest pain and palpitations.  Gastrointestinal: Negative for abdominal pain.  Genitourinary: Negative for difficulty urinating, enuresis, frequency, testicular pain and urgency.       Pt thinks he can't full empty his bladder. It feels like this.   Pt states next appointment with urologist in the fall.  Musculoskeletal: Negative for back pain and gait problem.  Skin: Negative for rash.  Neurological: Negative for dizziness, seizures, speech difficulty, weakness, light-headedness, numbness and headaches.  Hematological: Negative for adenopathy. Does not bruise/bleed easily.  Psychiatric/Behavioral: Negative for behavioral problems, confusion, self-injury and suicidal ideas. The patient is not nervous/anxious.     Past Medical History:  Diagnosis Date  . Allergy   . Asthma    1988  . GERD (gastroesophageal reflux disease)   . Hyperlipidemia 04/26/2015  . Hypertension      Social History   Social History  . Marital status: Married    Spouse name: N/A  . Number of children: N/A  . Years of education: N/A    Occupational History  . Not on file.   Social History Main Topics  . Smoking status: Never Smoker  . Smokeless tobacco: Never Used  . Alcohol use 0.0 oz/week  . Drug use: Unknown  . Sexual activity: Not on file   Other Topics Concern  . Not on file   Social History Narrative  . No narrative on file    Past Surgical History:  Procedure Laterality Date  . EYE SURGERY     Age 59  . HERNIA REPAIR     3 surgeries    Family History  Problem Relation Age of Onset  . Heart disease Mother   . Lymphoma Father   . Cancer Father     No Known Allergies  Current Outpatient Prescriptions on File Prior to Visit  Medication Sig Dispense Refill  . albuterol (PROVENTIL HFA;VENTOLIN HFA) 108 (90 Base) MCG/ACT inhaler Inhale 2 puffs into the lungs every 6 (six) hours as needed for wheezing or shortness of breath. 1 Inhaler 0  . amLODipine (NORVASC) 5 MG tablet Take 1 tablet (5 mg total) by mouth daily. 90 tablet 1  . finasteride (PROSCAR) 5 MG tablet Take 5 mg by mouth daily.  11  . fluticasone (FLONASE) 50 MCG/ACT nasal spray Place 2 sprays into both nostrils daily. 16 g 1  . levocetirizine (XYZAL) 5 MG tablet TAKE 1 TABLET BY MOUTH EVERY DAY IN THE EVENING 30 tablet 0  . lisinopril (PRINIVIL,ZESTRIL) 20 MG tablet TAKE 1 TABLET BY MOUTH EVERY DAY 90 tablet 1  .  Multiple Vitamins-Minerals (MENS MULTIVITAMIN PLUS) TABS Take by mouth daily.    . simvastatin (ZOCOR) 20 MG tablet TAKE 1 TABLET (20 MG TOTAL) BY MOUTH AT BEDTIME. 90 tablet 1  . tamsulosin (FLOMAX) 0.4 MG CAPS capsule TAKE 1 CAPSULE (0.4 MG TOTAL) BY MOUTH DAILY. 90 capsule 1   No current facility-administered medications on file prior to visit.     BP 125/62   Pulse 67   Temp 97.7 F (36.5 C) (Oral)   Resp 16   Ht 5\' 7"  (1.702 m)   Wt 179 lb 3.2 oz (81.3 kg)   SpO2 99%   BMI 28.07 kg/m       Objective:   Physical Exam  General Mental Status- Alert. General Appearance- Not in acute distress.    Skin General: Color- Normal Color. Moisture- Normal Moisture.  Neck Carotid Arteries- Normal color. Moisture- Normal Moisture. No carotid bruits. No JVD.  Chest and Lung Exam Auscultation: Breath Sounds:-Normal.  Cardiovascular Auscultation:Rythm- Regular. Murmurs & Other Heart Sounds:Auscultation of the heart reveals- No Murmurs.  Abdomen Inspection:-Inspeection Normal. Palpation/Percussion:Note:No mass. Palpation and Percussion of the abdomen reveal- Non Tender, Non Distended + BS, no rebound or guarding.    Neurologic Cranial Nerve exam:- CN III-XII intact(No nystagmus), symmetric smile. Strength:- 5/5 equal and symmetric strength both upper and lower extremities.      Assessment & Plan:  Htn is well controlled. Continue current meds. No changes.  For high cholesterol will repeat lipid panel and cmp today.  For elevated psa continue to see urologist. Will get psa today and follow. With residual volume complaints if psa is elevated may refer you back than formerly scheduled. Continue flomax.  In October recommend getting flu vaccine in October.  Follow up in 3 months   Wiletta Bermingham, [name], [place]

## 2017-08-07 NOTE — Patient Instructions (Addendum)
Htn is well controlled. Continue current meds. No changes.  For high cholesterol will repeat lipid panel and cmp today.  For elevated psa continue to see urologist. Will get psa today and follow. With residual volume complaints if psa is elevated may refer you back than formerly scheduled. Continue flomax.  In October recommend getting flu vaccine in October.  Follow up in 3 months

## 2017-08-08 ENCOUNTER — Telehealth: Payer: Self-pay | Admitting: Medical

## 2017-08-08 DIAGNOSIS — E871 Hypo-osmolality and hyponatremia: Secondary | ICD-10-CM

## 2017-08-08 NOTE — Telephone Encounter (Signed)
Put in future cmp order.

## 2017-08-11 ENCOUNTER — Telehealth: Payer: Self-pay | Admitting: Medical

## 2017-08-11 NOTE — Telephone Encounter (Signed)
Caller name: Tylin Force Relationship to patient: self Can be reached: before 1pm [phone]                             After 1pm please call [phone]  Reason for call: returning call lab results

## 2017-08-12 NOTE — Telephone Encounter (Signed)
Notified pt. 

## 2017-08-21 ENCOUNTER — Other Ambulatory Visit: Payer: Self-pay | Admitting: Medical

## 2017-08-26 ENCOUNTER — Other Ambulatory Visit (INDEPENDENT_AMBULATORY_CARE_PROVIDER_SITE_OTHER): Payer: Medicare Other

## 2017-08-26 DIAGNOSIS — E871 Hypo-osmolality and hyponatremia: Secondary | ICD-10-CM

## 2017-08-26 DIAGNOSIS — H2512 Age-related nuclear cataract, left eye: Secondary | ICD-10-CM

## 2017-08-26 HISTORY — DX: Age-related nuclear cataract, left eye: H25.12

## 2017-08-26 LAB — COMPREHENSIVE METABOLIC PANEL
ALBUMIN: 4.3 g/dL (ref 3.5–5.2)
ALK PHOS: 41 U/L (ref 39–117)
ALT: 20 U/L (ref 0–53)
AST: 19 U/L (ref 0–37)
BUN: 19 mg/dL (ref 6–23)
CHLORIDE: 96 meq/L (ref 96–112)
CO2: 34 mEq/L — ABNORMAL HIGH (ref 19–32)
Calcium: 9.9 mg/dL (ref 8.4–10.5)
Creatinine, Ser: 0.96 mg/dL (ref 0.40–1.50)
GFR: 80.54 mL/min (ref >60.00)
Glucose, Bld: 103 mg/dL — ABNORMAL HIGH (ref 70–99)
Potassium: 4.7 mEq/L (ref 3.5–5.1)
SODIUM: 134 meq/L — AB (ref 135–145)
TOTAL PROTEIN: 7.6 g/dL (ref 6.0–8.3)
Total Bilirubin: 0.8 mg/dL (ref 0.2–1.2)

## 2017-09-03 DIAGNOSIS — Z87891 Personal history of nicotine dependence: Secondary | ICD-10-CM | POA: Diagnosis not present

## 2017-09-03 DIAGNOSIS — E119 Type 2 diabetes mellitus without complications: Secondary | ICD-10-CM | POA: Diagnosis not present

## 2017-09-03 DIAGNOSIS — H2513 Age-related nuclear cataract, bilateral: Secondary | ICD-10-CM | POA: Diagnosis not present

## 2017-09-03 DIAGNOSIS — E7800 Pure hypercholesterolemia, unspecified: Secondary | ICD-10-CM | POA: Diagnosis not present

## 2017-09-03 DIAGNOSIS — Z79899 Other long term (current) drug therapy: Secondary | ICD-10-CM | POA: Diagnosis not present

## 2017-09-03 DIAGNOSIS — H25013 Cortical age-related cataract, bilateral: Secondary | ICD-10-CM | POA: Diagnosis not present

## 2017-09-03 DIAGNOSIS — H25043 Posterior subcapsular polar age-related cataract, bilateral: Secondary | ICD-10-CM | POA: Diagnosis not present

## 2017-09-03 DIAGNOSIS — H2512 Age-related nuclear cataract, left eye: Secondary | ICD-10-CM | POA: Diagnosis not present

## 2017-09-03 DIAGNOSIS — I10 Essential (primary) hypertension: Secondary | ICD-10-CM | POA: Diagnosis not present

## 2017-09-03 DIAGNOSIS — E785 Hyperlipidemia, unspecified: Secondary | ICD-10-CM | POA: Diagnosis not present

## 2017-10-17 DIAGNOSIS — Z23 Encounter for immunization: Secondary | ICD-10-CM | POA: Diagnosis not present

## 2017-10-22 DIAGNOSIS — R351 Nocturia: Secondary | ICD-10-CM | POA: Diagnosis not present

## 2017-10-22 DIAGNOSIS — C61 Malignant neoplasm of prostate: Secondary | ICD-10-CM | POA: Diagnosis not present

## 2017-10-22 DIAGNOSIS — N401 Enlarged prostate with lower urinary tract symptoms: Secondary | ICD-10-CM | POA: Diagnosis not present

## 2017-11-18 ENCOUNTER — Other Ambulatory Visit: Payer: Self-pay | Admitting: Medical

## 2017-12-09 ENCOUNTER — Other Ambulatory Visit: Payer: Self-pay | Admitting: Medical

## 2018-01-13 DIAGNOSIS — Z85828 Personal history of other malignant neoplasm of skin: Secondary | ICD-10-CM | POA: Diagnosis not present

## 2018-01-13 DIAGNOSIS — Z08 Encounter for follow-up examination after completed treatment for malignant neoplasm: Secondary | ICD-10-CM | POA: Diagnosis not present

## 2018-01-13 DIAGNOSIS — L570 Actinic keratosis: Secondary | ICD-10-CM | POA: Diagnosis not present

## 2018-01-13 DIAGNOSIS — L821 Other seborrheic keratosis: Secondary | ICD-10-CM | POA: Diagnosis not present

## 2018-01-26 ENCOUNTER — Other Ambulatory Visit: Payer: Self-pay | Admitting: Medical

## 2018-02-06 ENCOUNTER — Ambulatory Visit: Payer: PRIVATE HEALTH INSURANCE | Admitting: Medical

## 2018-02-18 ENCOUNTER — Other Ambulatory Visit: Payer: Self-pay | Admitting: Medical

## 2018-03-17 DIAGNOSIS — Z961 Presence of intraocular lens: Secondary | ICD-10-CM | POA: Diagnosis not present

## 2018-03-17 DIAGNOSIS — H35372 Puckering of macula, left eye: Secondary | ICD-10-CM | POA: Diagnosis not present

## 2018-03-17 DIAGNOSIS — H2511 Age-related nuclear cataract, right eye: Secondary | ICD-10-CM | POA: Diagnosis not present

## 2018-03-17 DIAGNOSIS — H53032 Strabismic amblyopia, left eye: Secondary | ICD-10-CM | POA: Diagnosis not present

## 2018-04-16 ENCOUNTER — Encounter: Payer: Self-pay | Admitting: Medical

## 2018-04-16 ENCOUNTER — Ambulatory Visit (INDEPENDENT_AMBULATORY_CARE_PROVIDER_SITE_OTHER): Payer: Medicare Other | Admitting: Medical

## 2018-04-16 VITALS — BP 128/69 | HR 72 | Temp 97.5°F | Resp 16 | Ht 67.0 in | Wt 177.0 lb

## 2018-04-16 DIAGNOSIS — E785 Hyperlipidemia, unspecified: Secondary | ICD-10-CM | POA: Diagnosis not present

## 2018-04-16 DIAGNOSIS — I10 Essential (primary) hypertension: Secondary | ICD-10-CM | POA: Diagnosis not present

## 2018-04-16 DIAGNOSIS — R972 Elevated prostate specific antigen [PSA]: Secondary | ICD-10-CM

## 2018-04-16 LAB — COMPREHENSIVE METABOLIC PANEL
ALBUMIN: 4.1 g/dL (ref 3.5–5.2)
ALT: 20 U/L (ref 0–53)
AST: 20 U/L (ref 0–37)
Alkaline Phosphatase: 44 U/L (ref 39–117)
BILIRUBIN TOTAL: 0.8 mg/dL (ref 0.2–1.2)
BUN: 20 mg/dL (ref 6–23)
CALCIUM: 9.4 mg/dL (ref 8.4–10.5)
CO2: 30 mEq/L (ref 19–32)
CREATININE: 1.05 mg/dL (ref 0.40–1.50)
Chloride: 98 mEq/L (ref 96–112)
GFR: 72.5 mL/min (ref >60.00)
Glucose, Bld: 95 mg/dL (ref 70–99)
Potassium: 4.7 mEq/L (ref 3.5–5.1)
Sodium: 133 mEq/L — ABNORMAL LOW (ref 135–145)
TOTAL PROTEIN: 6.9 g/dL (ref 6.0–8.3)

## 2018-04-16 LAB — LIPID PANEL
CHOLESTEROL: 161 mg/dL (ref 0–200)
HDL: 61.8 mg/dL (ref >39.00)
LDL Cholesterol: 87 mg/dL (ref 0–99)
NonHDL: 98.99
TRIGLYCERIDES: 59 mg/dL (ref 0.0–149.0)
Total CHOL/HDL Ratio: 3
VLDL: 11.8 mg/dL (ref 0.0–40.0)

## 2018-04-16 LAB — PSA: PSA: 8.6 ng/mL — ABNORMAL HIGH (ref 0.10–4.00)

## 2018-04-16 MED ORDER — LISINOPRIL 20 MG PO TABS: 20.0000 mg | ORAL_TABLET | Freq: Every day | ORAL | 1 refills | Status: DC

## 2018-04-16 MED ORDER — AMLODIPINE BESYLATE 5 MG PO TABS: 5.0000 mg | ORAL_TABLET | Freq: Every day | ORAL | 1 refills | Status: DC

## 2018-04-16 NOTE — Patient Instructions (Addendum)
Your blood pressure is well controlled and continue current bp medications.  For your high cholesterol, continue current med and get lipid panel today.Get metabolic panel today.  Will repeat psa today. Recommend you reschedule with your urologist.  Can schedule for CPE next visit.   Recommend keep up with dermatoligst appointment.  Follow up in 2-3 months or as needed

## 2018-04-16 NOTE — Progress Notes (Signed)
Subjective:    Patient ID: Donald Porter, male    DOB: May 06, 1941, 77 y.o.   MRN: [id number]  HPI  Pt in for follow up.  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 Just short of full time.   Pt blood pressure is well controlled. No cardiac or neurologic signs or symptoms.  Pt lipid panel 8 months ago looked good/well controled.  Pt has history of elevated psa but had dropped on last check 8 months ago. Pt got note from urologist to follow up. Pt had positive biopsy. Next biopsy was negative. Psa has been down trending.   Review of Systems  Constitutional: Negative for chills, fatigue and fever.  HENT: Negative for congestion, ear pain, hearing loss, postnasal drip, rhinorrhea, sinus pressure and sinus pain.   Cardiovascular: Negative for chest pain and palpitations.  Gastrointestinal: Negative for abdominal pain.  Genitourinary: Negative for flank pain, hematuria, penile pain, testicular pain and urgency.       Slight frequent urination but this has been chronic.  Musculoskeletal: Negative for back pain.  Skin: Negative for rash.  Neurological: Negative for dizziness, syncope, numbness and headaches.  Hematological: Negative for adenopathy. Does not bruise/bleed easily.  Psychiatric/Behavioral: Negative for behavioral problems and confusion. The patient is not nervous/anxious.    Past Medical History:  Diagnosis Date  . Allergy   . Asthma    1988  . GERD (gastroesophageal reflux disease)   . Hyperlipidemia 04/26/2015  . Hypertension      Social History   Socioeconomic History  . Marital status: Married    Spouse name: Not on file  . Number of children: Not on file  . Years of education: Not on file  . Highest education level: Not on file  Occupational History  . Not on file  Social Needs  . Financial resource strain: Not on file  . Food insecurity:    Worry: Not on file    Inability: Not on file  . Transportation needs:    Medical: Not on file   Non-medical: Not on file  Tobacco Use  . Smoking status: Never Smoker  . Smokeless tobacco: Never Used  Substance and Sexual Activity  . Alcohol use: Yes    Alcohol/week: 0.0 oz  . Drug use: Not on file  . Sexual activity: Not on file  Lifestyle  . Physical activity:    Days per week: Not on file    Minutes per session: Not on file  . Stress: Not on file  Relationships  . Social connections:    Talks on phone: Not on file    Gets together: Not on file    Attends religious service: Not on file    Active member of club or organization: Not on file    Attends meetings of clubs or organizations: Not on file    Relationship status: Not on file  . Intimate partner violence:    Fear of current or ex partner: Not on file    Emotionally abused: Not on file    Physically abused: Not on file    Forced sexual activity: Not on file  Other Topics Concern  . Not on file  Social History Narrative  . Not on file    Past Surgical History:  Procedure Laterality Date  . EYE SURGERY     Age 56  . HERNIA REPAIR     3 surgeries    Family History  Problem Relation Age of Onset  . Heart  disease Mother   . Lymphoma Father   . Cancer Father     No Known Allergies  Current Outpatient Medications on File Prior to Visit  Medication Sig Dispense Refill  . albuterol (PROVENTIL HFA;VENTOLIN HFA) 108 (90 Base) MCG/ACT inhaler Inhale 2 puffs into the lungs every 6 (six) hours as needed for wheezing or shortness of breath. 1 Inhaler 0  . finasteride (PROSCAR) 5 MG tablet Take 5 mg by mouth daily.  11  . fluticasone (FLONASE) 50 MCG/ACT nasal spray Place 2 sprays into both nostrils daily. 16 g 1  . levocetirizine (XYZAL) 5 MG tablet TAKE 1 TABLET BY MOUTH EVERY DAY IN THE EVENING 30 tablet 0  . Multiple Vitamins-Minerals (MENS MULTIVITAMIN PLUS) TABS Take by mouth daily.    . simvastatin (ZOCOR) 20 MG tablet TAKE 1 TABLET (20 MG TOTAL) BY MOUTH AT BEDTIME. 90 tablet 1  . tamsulosin (FLOMAX) 0.4  MG CAPS capsule TAKE 1 CAPSULE BY MOUTH EVERY DAY 90 capsule 1   No current facility-administered medications on file prior to visit.     BP 128/69   Pulse 72   Temp (!) 97.5 F (36.4 C) (Oral)   Resp 16   Ht 5\' 7"  (1.702 m)   Wt 177 lb (80.3 kg)   SpO2 99%   BMI 27.72 kg/m       Objective:   Physical Exam  General Mental Status- Alert. General Appearance- Not in acute distress.   Skin General: Color- Normal Color. Moisture- Normal Moisture.  Neck Carotid Arteries- Normal color. Moisture- Normal Moisture. No carotid bruits. No JVD.  Chest and Lung Exam Auscultation: Breath Sounds:-Normal.  Cardiovascular Auscultation:Rythm- Regular. Murmurs & Other Heart Sounds:Auscultation of the heart reveals- No Murmurs.  Abdomen Inspection:-Inspeection Normal. Palpation/Percussion:Note:No mass. Palpation and Percussion of the abdomen reveal- Non Tender, Non Distended + BS, no rebound or guarding.  Neurologic Cranial Nerve exam:- CN III-XII intact(No nystagmus), symmetric smile. Strength:- 5/5 equal and symmetric strength both upper and lower extremities.  Skin- some areas on his back that appear to be seborrheic keratosis.(scattered and numerous)     Assessment & Plan:  Your blood pressure is well controlled and continue current bp medications.  For your high cholesterol, continue current med and get lipid panel today.Get metabolic panel today.  Will repeat psa today. Recommend you reschedule with your urologist.  Can schedule for CPE next visit.   Follow up in 2-3 months or as needed  [employer], [employer]

## 2018-05-08 ENCOUNTER — Ambulatory Visit: Payer: PRIVATE HEALTH INSURANCE | Admitting: *Deleted

## 2018-05-28 NOTE — Progress Notes (Addendum)
Subjective:   Donald Porter is a 77 y.o. male who presents for an Initial Medicare Annual Wellness Visit.   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  Review of Systems No ROS.  Medicare Wellness Visit. Additional risk factors are reflected in the social history. Cardiac Risk Factors include: advanced age (>66men, >73 women);dyslipidemia;hypertension;male gender Sleep patterns: wakes several times to urinate. Still sleeps very well per pt. Home Safety/Smoke Alarms: Feels safe in home. Smoke alarms in place.  Living environment; residence and Adult nurse: lives alone in 2 story home.  No issues with stairs. Lives on 1st floor though and rarely goes upstairs.  Male:   CCS-declines  PSA-  Lab Results  Component Value Date   PSA 8.60 (H) 04/16/2018   PSA 7.28 (H) 08/07/2017   PSA 11.43 (H) 11/09/2015      Objective:    Today's Vitals   06/01/18 1024  BP: (!) 122/58  Pulse: 78  SpO2: 97%  Weight: 182 lb 3.2 oz (82.6 kg)  Height: 5\' 7"  (1.702 m)   Body mass index is 28.54 kg/m.  Advanced Directives 06/01/2018 11/26/2014  Does Patient Have a Medical Advance Directive? Yes No  Type of Paramedic of [place];Living will -  Does patient want to make changes to medical advance directive? No - Patient declined -  Copy of [place] in Chart? No - copy requested -  Would patient like information on creating a medical advance directive? - No - patient declined information    Current Medications (verified) Outpatient Encounter Medications as of 06/01/2018  Medication Sig  . amLODipine (NORVASC) 5 MG tablet Take 1 tablet (5 mg total) by mouth daily.  . finasteride (PROSCAR) 5 MG tablet Take 5 mg by mouth daily.  Marland Kitchen lisinopril (PRINIVIL,ZESTRIL) 20 MG tablet Take 1 tablet (20 mg total) by mouth daily.  . Multiple Vitamins-Minerals (MENS MULTIVITAMIN PLUS) TABS Take by mouth daily.  . simvastatin (ZOCOR) 20 MG tablet TAKE 1 TABLET  (20 MG TOTAL) BY MOUTH AT BEDTIME.  . tamsulosin (FLOMAX) 0.4 MG CAPS capsule TAKE 1 CAPSULE BY MOUTH EVERY DAY  . albuterol (PROVENTIL HFA;VENTOLIN HFA) 108 (90 Base) MCG/ACT inhaler Inhale 2 puffs into the lungs every 6 (six) hours as needed for wheezing or shortness of breath. (Patient not taking: Reported on 06/01/2018)  . fluticasone (FLONASE) 50 MCG/ACT nasal spray Place 2 sprays into both nostrils daily. (Patient not taking: Reported on 06/01/2018)  . levocetirizine (XYZAL) 5 MG tablet TAKE 1 TABLET BY MOUTH EVERY DAY IN THE EVENING (Patient not taking: Reported on 06/01/2018)   No facility-administered encounter medications on file as of 06/01/2018.     Allergies (verified) Patient has no known allergies.   History: Past Medical History:  Diagnosis Date  . Allergy   . Asthma    1988  . GERD (gastroesophageal reflux disease)   . Hyperlipidemia 04/26/2015  . Hypertension    Past Surgical History:  Procedure Laterality Date  . EYE SURGERY     Age 28  . HERNIA REPAIR     3 surgeries   Family History  Problem Relation Age of Onset  . Heart disease Mother   . Lymphoma Father   . Cancer Father    Social History   Socioeconomic History  . Marital status: Married    Spouse name: Not on file  . Number of children: Not on file  . Years of education: Not on file  . Highest  education level: Not on file  Occupational History  . Not on file  Social Needs  . Financial resource strain: Not on file  . Food insecurity:    Worry: Not on file    Inability: Not on file  . Transportation needs:    Medical: Not on file    Non-medical: Not on file  Tobacco Use  . Smoking status: Never Smoker  . Smokeless tobacco: Never Used  Substance and Sexual Activity  . Alcohol use: Yes    Comment: 1-2 glasses of wine 4-5 days per week  . Drug use: Not Currently  . Sexual activity: Not on file  Lifestyle  . Physical activity:    Days per week: Not on file    Minutes per session: Not on file    . Stress: Not on file  Relationships  . Social connections:    Talks on phone: Not on file    Gets together: Not on file    Attends religious service: Not on file    Active member of club or organization: Not on file    Attends meetings of clubs or organizations: Not on file    Relationship status: Not on file  Other Topics Concern  . Not on file  Social History Narrative  . Not on file   Tobacco Counseling Counseling given: Not Answered   Clinical Intake:     Pain : No/denies pain                 Activities of Daily Living In your present state of health, do you have any difficulty performing the following activities: 06/01/2018  Hearing? N  Vision? N  Comment wearing glasses.  Difficulty concentrating or making decisions? N  Walking or climbing stairs? N  Dressing or bathing? N  Doing errands, shopping? N  Preparing Food and eating ? N  Using the Toilet? N  In the past six months, have you accidently leaked urine? N  Do you have problems with loss of bowel control? N  Managing your Medications? N  Managing your Finances? N  Housekeeping or managing your Housekeeping? N  Some recent data might be hidden     Immunizations and Health Maintenance Immunization History  Administered Date(s) Administered  . Influenza,inj,Quad PF,6+ Mos 11/09/2015  . Influenza-Unspecified 09/18/2016  . Pneumococcal Conjugate-13 02/05/2017  . Pneumococcal Polysaccharide-23 12/07/2015  . Tdap 07/10/2016  . Zoster 11/09/2015   There are no preventive care reminders to display for this patient.  Patient Care Team: [name], Iris Pert as PCP - General (Physician Assistant)  Indicate any recent Medical Services you may have received from other than [facility] providers in the past year (date may be approximate).    Assessment:   This is a routine wellness examination for Donald Porter. Physical assessment deferred to PCP.  Hearing/Vision screen Hearing Screening Comments: Able to  hear conversational tones w/o difficulty. No issues reported.   Vision Screening Comments: Pt reports eye exam every 6 months   Dietary issues and exercise activities discussed: Current Exercise Habits: Home exercise routine, Type of exercise: walking, Time (Minutes): 30, Frequency (Times/Week): 3, Weekly Exercise (Minutes/Week): 90, Intensity: Mild, Exercise limited by: None identified Diet (meal preparation, eat out, water intake, caffeinated beverages, dairy products, fruits and vegetables): well balanced  Goals    . Maintain healthy activity (pt-stated)      Depression Screen PHQ 2/9 Scores 06/01/2018 02/05/2017 11/09/2015 11/08/2014  PHQ - 2 Score 0 0 0 0    Fall Risk  Fall Risk  06/01/2018 08/07/2017 02/05/2017 12/06/2016 11/09/2015  Falls in the past year? No No No No No  Comment - - - Emmi Telephone Survey: data to providers prior to load -    Cognitive Function: Ad8 score reviewed for issues:  Issues making decisions:no  Less interest in hobbies / activities:no  Repeats questions, stories (family complaining):no  Trouble using ordinary gadgets (microwave, computer, phone):no  Forgets the month or year: no  Mismanaging finances: no  Remembering appts:no  Daily problems with thinking and/or memory:no Ad8 score is=0         Screening Tests Health Maintenance  Topic Date Due  . INFLUENZA VACCINE  07/30/2018  . TETANUS/TDAP  07/10/2026  . PNA vac Low Risk Adult  Completed      Plan:  Please schedule your next medicare wellness visit with me in 1 yr.  Continue to eat heart healthy diet (full of fruits, vegetables, whole grains, lean protein, water--limit salt, fat, and sugar intake) and increase physical activity as tolerated.  Continue doing brain stimulating activities (puzzles, reading, adult coloring books, staying active) to keep memory sharp.      I have personally reviewed and noted the following in the patient's chart:   . Medical and social  history . Use of alcohol, tobacco or illicit drugs  . Current medications and supplements . Functional ability and status . Nutritional status . Physical activity . Advanced directives . List of other physicians . Hospitalizations, surgeries, and ER visits in previous 12 months . Vitals . Screenings to include cognitive, depression, and falls . Referrals and appointments  In addition, I have reviewed and discussed with patient certain preventive protocols, quality metrics, and best practice recommendations. A written personalized care plan for preventive services as well as general preventive health recommendations were provided to patient.     Shela Nevin, [place]   06/01/2018   Agree with evaluation and recommendations of RN. Note pt see urologist for elevated psa/hx of prostate CA.  Mackie Pai, PA-C

## 2018-06-01 ENCOUNTER — Encounter: Payer: Self-pay | Admitting: *Deleted

## 2018-06-01 ENCOUNTER — Ambulatory Visit (INDEPENDENT_AMBULATORY_CARE_PROVIDER_SITE_OTHER): Payer: Medicare Other | Admitting: *Deleted

## 2018-06-01 VITALS — BP 122/58 | HR 78 | Ht 67.0 in | Wt 182.2 lb

## 2018-06-01 DIAGNOSIS — Z0000 Encounter for general adult medical examination without abnormal findings: Secondary | ICD-10-CM | POA: Diagnosis not present

## 2018-06-01 NOTE — Patient Instructions (Signed)
Please schedule your next medicare wellness visit with me in 1 yr.  Continue to eat heart healthy diet (full of fruits, vegetables, whole grains, lean protein, water--limit salt, fat, and sugar intake) and increase physical activity as tolerated.  Continue doing brain stimulating activities (puzzles, reading, adult coloring books, staying active) to keep memory sharp.    Donald Porter , Thank you for taking time to come for your Medicare Wellness Visit. I appreciate your ongoing commitment to your health goals. Please review the following plan we discussed and let me know if I can assist you in the future.   These are the goals we discussed: Goals    . Maintain healthy activity (pt-stated)       This is a list of the screening recommended for you and due dates:  Health Maintenance  Topic Date Due  . Flu Shot  07/30/2018  . Tetanus Vaccine  07/10/2026  . Pneumonia vaccines  Completed    Health Maintenance, Male A healthy lifestyle and preventive care is important for your health and wellness. Ask your health care provider about what schedule of regular examinations is right for you. What should I know about weight and diet? Eat a Healthy Diet  Eat plenty of vegetables, fruits, whole grains, low-fat dairy products, and lean protein.  Do not eat a lot of foods high in solid fats, added sugars, or salt.  Maintain a Healthy Weight Regular exercise can help you achieve or maintain a healthy weight. You should:  Do at least 150 minutes of exercise each week. The exercise should increase your heart rate and make you sweat (moderate-intensity exercise).  Do strength-training exercises at least twice a week.  Watch Your Levels of Cholesterol and Blood Lipids  Have your blood tested for lipids and cholesterol every 5 years starting at 79 years of age. If you are at high risk for heart disease, you should start having your blood tested when you are 79 years old. You may need to have your  cholesterol levels checked more often if: ? Your lipid or cholesterol levels are high. ? You are older than 79 years of age. ? You are at high risk for heart disease.  What should I know about cancer screening? Many types of cancers can be detected early and may often be prevented. Lung Cancer  You should be screened every year for lung cancer if: ? You are a current smoker who has smoked for at least 30 years. ? You are a former smoker who has quit within the past 15 years.  Talk to your health care provider about your screening options, when you should start screening, and how often you should be screened.  Colorectal Cancer  Routine colorectal cancer screening usually begins at 79 years of age and should be repeated every 5-10 years until you are 79 years old. You may need to be screened more often if early forms of precancerous polyps or small growths are found. Your health care provider may recommend screening at an earlier age if you have risk factors for colon cancer.  Your health care provider may recommend using home test kits to check for hidden blood in the stool.  A small camera at the end of a tube can be used to examine your colon (sigmoidoscopy or colonoscopy). This checks for the earliest forms of colorectal cancer.  Prostate and Testicular Cancer  Depending on your age and overall health, your health care provider may do certain tests to screen for  prostate and testicular cancer.  Talk to your health care provider about any symptoms or concerns you have about testicular or prostate cancer.  Skin Cancer  Check your skin from head to toe regularly.  Tell your health care provider about any new moles or changes in moles, especially if: ? There is a change in a mole's size, shape, or color. ? You have a mole that is larger than a pencil eraser.  Always use sunscreen. Apply sunscreen liberally and repeat throughout the day.  Protect yourself by wearing long sleeves,  pants, a wide-brimmed hat, and sunglasses when outside.  What should I know about heart disease, diabetes, and high blood pressure?  If you are 60-51 years of age, have your blood pressure checked every 3-5 years. If you are 49 years of age or older, have your blood pressure checked every year. You should have your blood pressure measured twice-once when you are at a hospital or clinic, and once when you are not at a hospital or clinic. Record the average of the two measurements. To check your blood pressure when you are not at a hospital or clinic, you can use: ? An automated blood pressure machine at a pharmacy. ? A home blood pressure monitor.  Talk to your health care provider about your target blood pressure.  If you are between 75-79 years old, ask your health care provider if you should take aspirin to prevent heart disease.  Have regular diabetes screenings by checking your fasting blood sugar level. ? If you are at a normal weight and have a low risk for diabetes, have this test once every three years after the age of 27. ? If you are overweight and have a high risk for diabetes, consider being tested at a younger age or more often.  A one-time screening for abdominal aortic aneurysm (AAA) by ultrasound is recommended for men aged 35-75 years who are current or former smokers. What should I know about preventing infection? Hepatitis B If you have a higher risk for hepatitis B, you should be screened for this virus. Talk with your health care provider to find out if you are at risk for hepatitis B infection. Hepatitis C Blood testing is recommended for:  Everyone born from [birth year] through 1967.  Anyone with known risk factors for hepatitis C.  Sexually Transmitted Diseases (STDs)  You should be screened each year for STDs including gonorrhea and chlamydia if: ? You are sexually active and are younger than 79 years of age. ? You are older than 79 years of age and your health care  provider tells you that you are at risk for this type of infection. ? Your sexual activity has changed since you were last screened and you are at an increased risk for chlamydia or gonorrhea. Ask your health care provider if you are at risk.  Talk with your health care provider about whether you are at high risk of being infected with HIV. Your health care provider may recommend a prescription medicine to help prevent HIV infection.  What else can I do?  Schedule regular health, dental, and eye exams.  Stay current with your vaccines (immunizations).  Do not use any tobacco products, such as cigarettes, chewing tobacco, and e-cigarettes. If you need help quitting, ask your health care provider.  Limit alcohol intake to no more than 2 drinks per day. One drink equals 12 ounces of beer, 5 ounces of wine, or 1 ounces of hard liquor.  Do not  use street drugs.  Do not share needles.  Ask your health care provider for help if you need support or information about quitting drugs.  Tell your health care provider if you often feel depressed.  Tell your health care provider if you have ever been abused or do not feel safe at home. This information is not intended to replace advice given to you by your health care provider. Make sure you discuss any questions you have with your health care provider. Document Released: 06/13/2008 Document Revised: 08/14/2016 Document Reviewed: 09/19/2015 Elsevier Interactive Patient Education  [employer].

## 2018-06-09 ENCOUNTER — Other Ambulatory Visit: Payer: Self-pay | Admitting: Medical

## 2018-06-17 ENCOUNTER — Telehealth: Payer: Self-pay

## 2018-06-17 NOTE — Telephone Encounter (Signed)
Message routed to Donald Porter, PCP, re: proscar and flomax questions.   Copied from [place] [phone]. Topic: General - Other >> Jun 16, 2018 10:48 AM Carolyn Stare wrote:  Pt call to say he thinks the finasteride (PROSCAR) 5 MG tablet  is messing with his man hood. and would like to know if it will be ok to take   Pt would like to  know if he can take 2 TAMSULOSIN -FLOMAX 2 times a day not at the same time and if he can ne will need a new RX called into the pharmacy .   Pharmacy    [place]

## 2018-06-18 ENCOUNTER — Telehealth: Payer: Self-pay | Admitting: Medical

## 2018-06-18 MED ORDER — TAMSULOSIN HCL 0.4 MG PO CAPS: ORAL_CAPSULE | ORAL | 3 refills | Status: DC

## 2018-06-18 NOTE — Telephone Encounter (Signed)
Opened to review 

## 2018-06-18 NOTE — Telephone Encounter (Signed)
opened to review and send rx.

## 2018-06-18 NOTE — Telephone Encounter (Signed)
Left pt message on both his cell and home phone.  Message was to the fact that understood his complaint that Proscar might be affecting his libido.  I did send in Flomax at the 0.8 dose per day.  Advised him to take 2 of the 0.4 mg capsules daily.  I mention we will see if his insurance authorizes the prescription like this.  He could hold the Proscar for the next 3 days and see if he notes any improvement in his symptoms.  But also advised him to call his urologist early next week Monday or Tuesday and get confirmation that he agrees with this medication change.

## 2018-06-20 ENCOUNTER — Other Ambulatory Visit: Payer: Self-pay | Admitting: Medical

## 2018-07-20 ENCOUNTER — Encounter: Payer: Self-pay | Admitting: Medical

## 2018-07-20 ENCOUNTER — Ambulatory Visit (INDEPENDENT_AMBULATORY_CARE_PROVIDER_SITE_OTHER): Payer: Medicare Other | Admitting: Medical

## 2018-07-20 VITALS — BP 125/68 | HR 78 | Temp 98.0°F | Resp 16 | Ht 67.0 in | Wt 178.4 lb

## 2018-07-20 DIAGNOSIS — I10 Essential (primary) hypertension: Secondary | ICD-10-CM

## 2018-07-20 DIAGNOSIS — N400 Enlarged prostate without lower urinary tract symptoms: Secondary | ICD-10-CM

## 2018-07-20 DIAGNOSIS — J069 Acute upper respiratory infection, unspecified: Secondary | ICD-10-CM | POA: Diagnosis not present

## 2018-07-20 DIAGNOSIS — R05 Cough: Secondary | ICD-10-CM

## 2018-07-20 DIAGNOSIS — R059 Cough, unspecified: Secondary | ICD-10-CM

## 2018-07-20 MED ORDER — TAMSULOSIN HCL 0.4 MG PO CAPS: ORAL_CAPSULE | ORAL | 3 refills | Status: DC

## 2018-07-20 MED ORDER — AZITHROMYCIN 250 MG PO TABS: ORAL_TABLET | ORAL | 0 refills | Status: DC

## 2018-07-20 MED ORDER — AMLODIPINE BESYLATE 5 MG PO TABS: 5.0000 mg | ORAL_TABLET | Freq: Every day | ORAL | 1 refills | Status: DC

## 2018-07-20 NOTE — Patient Instructions (Addendum)
For htn continue current bp meds. Gave refill today of amlodipine.  Likely mild uri from recent air travel. Can use flonase for nasal congestion. If symptoms worsen as discussed can use azithromycin print rx. Giving for you today.  Cough minimal and no prescription given.  I did refill your refill your flomax today. Please follow up with urologist.  Follow up in 3-6 months or as needed

## 2018-07-20 NOTE — Progress Notes (Signed)
Subjective:    Patient ID: Donald Porter, male    DOB: 11-29-1941, 77 y.o.   MRN: [id number]  HPI  Pt in for one week of nasal congestion and stuffy. Pt states his symptoms are mild presently. Some mild productive cough about one week ago. Mucus is getting thinner and less color.  3 weeks ago fell out of bunk bed at his daughter house in [place]. Sore left chest wall after fall but now pain mostly resolved. Has good range of left shoulder.   About one week ago some faint pain in his lower ribs when coughed. But again as stated above feeling better. No fever, no chills or sweats.  Pt states urinating better with flomax twice a day. He is trying to schedule follow up with urologist.(pt states proscar  "messed with his masculinity". He read some info on gycecomastia related with proscar. So he stopped that and I advised increased flomax.   Pt blood pressure has been well controlled.   Review of Systems  Constitutional: Negative for chills, fatigue and fever.  HENT: Positive for congestion. Negative for drooling, ear pain and facial swelling.   Respiratory: Positive for cough. Negative for chest tightness, shortness of breath and wheezing.        Faint residual cough.  Cardiovascular: Negative for chest pain and palpitations.  Gastrointestinal: Negative for abdominal pain.  Genitourinary:       Urinating better noew.  Musculoskeletal: Negative for back pain.       Ribs feeling better from fall.   Skin: Negative for rash.  Neurological: Negative for dizziness, speech difficulty, weakness and headaches.  Hematological: Negative for adenopathy. Does not bruise/bleed easily.  Psychiatric/Behavioral: Negative for behavioral problems and confusion.    Past Medical History:  Diagnosis Date  . Allergy   . Asthma    1988  . GERD (gastroesophageal reflux disease)   . Hyperlipidemia 04/26/2015  . Hypertension      Social History   Socioeconomic History  . Marital status: Married   Spouse name: Not on file  . Number of children: Not on file  . Years of education: Not on file  . Highest education level: Not on file  Occupational History  . Not on file  Social Needs  . Financial resource strain: Not on file  . Food insecurity:    Worry: Not on file    Inability: Not on file  . Transportation needs:    Medical: Not on file    Non-medical: Not on file  Tobacco Use  . Smoking status: Never Smoker  . Smokeless tobacco: Never Used  Substance and Sexual Activity  . Alcohol use: Yes    Comment: 1-2 glasses of wine 4-5 days per week  . Drug use: Not Currently  . Sexual activity: Not on file  Lifestyle  . Physical activity:    Days per week: Not on file    Minutes per session: Not on file  . Stress: Not on file  Relationships  . Social connections:    Talks on phone: Not on file    Gets together: Not on file    Attends religious service: Not on file    Active member of club or organization: Not on file    Attends meetings of clubs or organizations: Not on file    Relationship status: Not on file  . Intimate partner violence:    Fear of current or ex partner: Not on file    Emotionally abused: Not on  file    Physically abused: Not on file    Forced sexual activity: Not on file  Other Topics Concern  . Not on file  Social History Narrative  . Not on file    Past Surgical History:  Procedure Laterality Date  . EYE SURGERY     Age 45  . HERNIA REPAIR     3 surgeries    Family History  Problem Relation Age of Onset  . Heart disease Mother   . Lymphoma Father   . Cancer Father     No Known Allergies  Current Outpatient Medications on File Prior to Visit  Medication Sig Dispense Refill  . albuterol (PROVENTIL HFA;VENTOLIN HFA) 108 (90 Base) MCG/ACT inhaler Inhale 2 puffs into the lungs every 6 (six) hours as needed for wheezing or shortness of breath. 1 Inhaler 0  . fluticasone (FLONASE) 50 MCG/ACT nasal spray Place 2 sprays into both nostrils  daily. 16 g 1  . levocetirizine (XYZAL) 5 MG tablet TAKE 1 TABLET BY MOUTH EVERY DAY IN THE EVENING 30 tablet 0  . lisinopril (PRINIVIL,ZESTRIL) 20 MG tablet Take 1 tablet (20 mg total) by mouth daily. 90 tablet 1  . Multiple Vitamins-Minerals (MENS MULTIVITAMIN PLUS) TABS Take by mouth daily.    . simvastatin (ZOCOR) 20 MG tablet TAKE 1 TABLET (20 MG TOTAL) BY MOUTH AT BEDTIME. 90 tablet 1   No current facility-administered medications on file prior to visit.     BP 125/68   Pulse 78   Temp 98 F (36.7 C) (Oral)   Resp 16   Ht 5\' 7"  (1.702 m)   Wt 178 lb 6.4 oz (80.9 kg)   SpO2 99%   BMI 27.94 kg/m       Objective:   Physical Exam  General Mental Status- Alert. General Appearance- Not in acute distress.   Skin General: Color- Normal Color. Moisture- Normal Moisture.  Neck Carotid Arteries- Normal color. Moisture- Normal Moisture. No carotid bruits. No JVD.  Chest and Lung Exam Auscultation: Breath Sounds:-Normal.  Cardiovascular Auscultation:Rythm- Regular. Murmurs & Other Heart Sounds:Auscultation of the heart reveals- No Murmurs.  Abdomen Inspection:-Inspeection Normal. Palpation/Percussion:Note:No mass. Palpation and Percussion of the abdomen reveal- Non Tender, Non Distended + BS, no rebound or guarding.    Neurologic Cranial Nerve exam:- CN III-XII intact(No nystagmus), symmetric smile. Normal/IntactStrength:- 5/5 equal and symmetric strength both upper and lower extremities.      Assessment & Plan:  For htn continue current bp meds. Gave refill today of amlodipine.  Likely mild uri from recent air travel. Can use flonase for nasal congestion. If symptoms worsen as discussed can use azithromycin print rx. Giving for you today.  Cough minimal and no prescription given.  I did refill your refill your flomax today. Please follow up with urologist.  Follow up in 3-6 months or as needed  [employer], PA-C

## 2018-08-15 ENCOUNTER — Other Ambulatory Visit: Payer: Self-pay | Admitting: Medical

## 2018-08-26 ENCOUNTER — Other Ambulatory Visit: Payer: Self-pay | Admitting: Medical

## 2018-09-17 DIAGNOSIS — H348322 Tributary (branch) retinal vein occlusion, left eye, stable: Secondary | ICD-10-CM | POA: Diagnosis not present

## 2018-09-17 DIAGNOSIS — H35372 Puckering of macula, left eye: Secondary | ICD-10-CM | POA: Diagnosis not present

## 2018-09-17 DIAGNOSIS — H2511 Age-related nuclear cataract, right eye: Secondary | ICD-10-CM | POA: Diagnosis not present

## 2018-10-05 DIAGNOSIS — L853 Xerosis cutis: Secondary | ICD-10-CM | POA: Diagnosis not present

## 2018-10-05 DIAGNOSIS — R21 Rash and other nonspecific skin eruption: Secondary | ICD-10-CM | POA: Diagnosis not present

## 2018-10-07 ENCOUNTER — Ambulatory Visit (INDEPENDENT_AMBULATORY_CARE_PROVIDER_SITE_OTHER): Payer: Medicare Other | Admitting: Medical

## 2018-10-07 ENCOUNTER — Encounter: Payer: Self-pay | Admitting: Medical

## 2018-10-07 VITALS — BP 115/64 | HR 69 | Temp 97.7°F | Resp 16 | Ht 67.0 in | Wt 176.4 lb

## 2018-10-07 DIAGNOSIS — Z23 Encounter for immunization: Secondary | ICD-10-CM

## 2018-10-07 DIAGNOSIS — Z8546 Personal history of malignant neoplasm of prostate: Secondary | ICD-10-CM | POA: Diagnosis not present

## 2018-10-07 DIAGNOSIS — J309 Allergic rhinitis, unspecified: Secondary | ICD-10-CM | POA: Diagnosis not present

## 2018-10-07 DIAGNOSIS — I10 Essential (primary) hypertension: Secondary | ICD-10-CM

## 2018-10-07 DIAGNOSIS — E785 Hyperlipidemia, unspecified: Secondary | ICD-10-CM | POA: Diagnosis not present

## 2018-10-07 LAB — COMPREHENSIVE METABOLIC PANEL
ALK PHOS: 45 U/L (ref 39–117)
ALT: 18 U/L (ref 0–53)
AST: 21 U/L (ref 0–37)
Albumin: 4.3 g/dL (ref 3.5–5.2)
BUN: 19 mg/dL (ref 6–23)
CO2: 31 meq/L (ref 19–32)
Calcium: 9.6 mg/dL (ref 8.4–10.5)
Chloride: 96 mEq/L (ref 96–112)
Creatinine, Ser: 0.99 mg/dL (ref 0.40–1.50)
GFR: 77.5 mL/min (ref >60.00)
GLUCOSE: 106 mg/dL — AB (ref 70–99)
POTASSIUM: 4.5 meq/L (ref 3.5–5.1)
Sodium: 132 mEq/L — ABNORMAL LOW (ref 135–145)
Total Bilirubin: 1 mg/dL (ref 0.2–1.2)
Total Protein: 7.5 g/dL (ref 6.0–8.3)

## 2018-10-07 LAB — LIPID PANEL
CHOL/HDL RATIO: 3
Cholesterol: 175 mg/dL (ref 0–200)
HDL: 67.3 mg/dL (ref >39.00)
LDL Cholesterol: 97 mg/dL (ref 0–99)
NONHDL: 108.11
Triglycerides: 56 mg/dL (ref 0.0–149.0)
VLDL: 11.2 mg/dL (ref 0.0–40.0)

## 2018-10-07 NOTE — Patient Instructions (Addendum)
For history of htn and hyperlipidemia, we will get cmp and lipid panel. Continue current medications and will let you know results.  For allergic rhinitis use flonase and xyzal if needed.  For hx of prostate CA see urologist.  Follow up date to be determined after lab review.

## 2018-10-07 NOTE — Progress Notes (Signed)
Subjective:    Patient ID: Donald Porter, male    DOB: 17-Jun-1941, 77 y.o.   MRN: [id number]  HPI  Pt has hx of htn. BP is well controlled.  Pt has hx of allergic rhinitis. Currently on flonase and xyzal.  High cholesterol history on zocor. Last labs show controlled.  Pt has hx of prostate cancer(hx of + biopsy 2 years ag) and sees urologist regularly.(pt has appointment week after next)   Review of Systems  Constitutional: Negative for chills, fatigue and fever.  Respiratory: Negative for cough, chest tightness, shortness of breath and wheezing.   Cardiovascular: Negative for chest pain and palpitations.  Gastrointestinal: Negative for abdominal pain.  Genitourinary: Negative for difficulty urinating, enuresis, frequency, genital sores, penile pain, testicular pain and urgency.  Musculoskeletal: Negative for back pain, neck pain and neck stiffness.  Skin: Negative for rash.  Neurological: Negative for dizziness, numbness and headaches.  Hematological: Negative for adenopathy. Does not bruise/bleed easily.  Psychiatric/Behavioral: Negative for behavioral problems and confusion.   Past Medical History:  Diagnosis Date  . Allergy   . Asthma    1988  . GERD (gastroesophageal reflux disease)   . Hyperlipidemia 04/26/2015  . Hypertension      Social History   Socioeconomic History  . Marital status: Married    Spouse name: Not on file  . Number of children: Not on file  . Years of education: Not on file  . Highest education level: Not on file  Occupational History  . Not on file  Social Needs  . Financial resource strain: Not on file  . Food insecurity:    Worry: Not on file    Inability: Not on file  . Transportation needs:    Medical: Not on file    Non-medical: Not on file  Tobacco Use  . Smoking status: Never Smoker  . Smokeless tobacco: Never Used  Substance and Sexual Activity  . Alcohol use: Yes    Comment: 1-2 glasses of wine 4-5 days per week  . Drug  use: Not Currently  . Sexual activity: Not on file  Lifestyle  . Physical activity:    Days per week: Not on file    Minutes per session: Not on file  . Stress: Not on file  Relationships  . Social connections:    Talks on phone: Not on file    Gets together: Not on file    Attends religious service: Not on file    Active member of club or organization: Not on file    Attends meetings of clubs or organizations: Not on file    Relationship status: Not on file  . Intimate partner violence:    Fear of current or ex partner: Not on file    Emotionally abused: Not on file    Physically abused: Not on file    Forced sexual activity: Not on file  Other Topics Concern  . Not on file  Social History Narrative  . Not on file    Past Surgical History:  Procedure Laterality Date  . EYE SURGERY     Age 53  . HERNIA REPAIR     3 surgeries    Family History  Problem Relation Age of Onset  . Heart disease Mother   . Lymphoma Father   . Cancer Father     No Known Allergies  Current Outpatient Medications on File Prior to Visit  Medication Sig Dispense Refill  . amLODipine (NORVASC) 5 MG tablet  Take 1 tablet (5 mg total) by mouth daily. 90 tablet 1  . betamethasone dipropionate (DIPROLENE) 0.05 % ointment Apply topically 2 (two) times daily.    . fluticasone (FLONASE) 50 MCG/ACT nasal spray Place 2 sprays into both nostrils daily. 16 g 1  . lisinopril (PRINIVIL,ZESTRIL) 20 MG tablet Take 1 tablet (20 mg total) by mouth daily. 90 tablet 1  . Multiple Vitamins-Minerals (MENS MULTIVITAMIN PLUS) TABS Take by mouth daily.    Marland Kitchen PROAIR HFA 108 (90 Base) MCG/ACT inhaler TAKE 2 PUFFS BY MOUTH EVERY 6 HOURS AS NEEDED FOR WHEEZE OR SHORTNESS OF BREATH 8.5 Inhaler 0  . simvastatin (ZOCOR) 20 MG tablet TAKE 1 TABLET (20 MG TOTAL) BY MOUTH AT BEDTIME. 90 tablet 1  . tamsulosin (FLOMAX) 0.4 MG CAPS capsule 1 tab po bid 60 capsule 3  . levocetirizine (XYZAL) 5 MG tablet TAKE 1 TABLET BY MOUTH  EVERY DAY IN THE EVENING (Patient not taking: Reported on 10/07/2018) 30 tablet 0   No current facility-administered medications on file prior to visit.     BP 115/64   Pulse 69   Temp 97.7 F (36.5 C) (Oral)   Resp 16   Ht 5\' 7"  (1.702 m)   Wt 176 lb 6.4 oz (80 kg)   SpO2 98%   BMI 27.63 kg/m       Objective:   Physical Exam  General Mental Status- Alert. General Appearance- Not in acute distress.   Skin General: Color- Normal Color. Moisture- Normal Moisture.  Neck Carotid Arteries- Normal color. Moisture- Normal Moisture. No carotid bruits. No JVD.  Chest and Lung Exam Auscultation: Breath Sounds:-Normal.  Cardiovascular Auscultation:Rythm- Regular. Murmurs & Other Heart Sounds:Auscultation of the heart reveals- No Murmurs.  Abdomen Inspection:-Inspeection Normal. Palpation/Percussion:Note:No mass. Palpation and Percussion of the abdomen reveal- Non Tender, Non Distended + BS, no rebound or guarding.  Neurologic Cranial Nerve exam:- CN III-XII intact(No nystagmus), symmetric smile. Strength:- 5/5 equal and symmetric strength both upper and lower extremities.      Assessment & Plan:  For history of htn and hyperlipidemia, we  will get cmp and lipid panel. Continue current medications and will let you know results.  For allergic rhinitis use flonase and xyzal if needed.  For hx of prostate CA see urologist.  Follow up date to be determined after lab review.  Mackie Pai, PA-C

## 2018-10-27 DIAGNOSIS — C61 Malignant neoplasm of prostate: Secondary | ICD-10-CM | POA: Diagnosis not present

## 2018-10-27 DIAGNOSIS — N400 Enlarged prostate without lower urinary tract symptoms: Secondary | ICD-10-CM | POA: Diagnosis not present

## 2018-11-18 ENCOUNTER — Other Ambulatory Visit: Payer: Self-pay | Admitting: Medical

## 2019-01-14 DIAGNOSIS — Z08 Encounter for follow-up examination after completed treatment for malignant neoplasm: Secondary | ICD-10-CM | POA: Diagnosis not present

## 2019-01-14 DIAGNOSIS — L821 Other seborrheic keratosis: Secondary | ICD-10-CM | POA: Diagnosis not present

## 2019-01-14 DIAGNOSIS — Z85828 Personal history of other malignant neoplasm of skin: Secondary | ICD-10-CM | POA: Diagnosis not present

## 2019-01-14 DIAGNOSIS — L570 Actinic keratosis: Secondary | ICD-10-CM | POA: Diagnosis not present

## 2019-01-14 DIAGNOSIS — L300 Nummular dermatitis: Secondary | ICD-10-CM | POA: Diagnosis not present

## 2019-01-14 DIAGNOSIS — L820 Inflamed seborrheic keratosis: Secondary | ICD-10-CM | POA: Diagnosis not present

## 2019-02-23 ENCOUNTER — Other Ambulatory Visit: Payer: Self-pay | Admitting: Medical

## 2019-02-24 ENCOUNTER — Other Ambulatory Visit: Payer: Self-pay | Admitting: Medical

## 2019-04-09 ENCOUNTER — Other Ambulatory Visit: Payer: Self-pay | Admitting: Medical

## 2019-04-23 DIAGNOSIS — C61 Malignant neoplasm of prostate: Secondary | ICD-10-CM | POA: Diagnosis not present

## 2019-04-30 DIAGNOSIS — R3912 Poor urinary stream: Secondary | ICD-10-CM | POA: Diagnosis not present

## 2019-04-30 DIAGNOSIS — N401 Enlarged prostate with lower urinary tract symptoms: Secondary | ICD-10-CM | POA: Diagnosis not present

## 2019-04-30 DIAGNOSIS — R351 Nocturia: Secondary | ICD-10-CM | POA: Diagnosis not present

## 2019-04-30 DIAGNOSIS — C61 Malignant neoplasm of prostate: Secondary | ICD-10-CM | POA: Diagnosis not present

## 2019-04-30 DIAGNOSIS — R3916 Straining to void: Secondary | ICD-10-CM | POA: Diagnosis not present

## 2019-04-30 DIAGNOSIS — R3915 Urgency of urination: Secondary | ICD-10-CM | POA: Diagnosis not present

## 2019-05-01 ENCOUNTER — Other Ambulatory Visit: Payer: Self-pay | Admitting: Medical

## 2019-05-03 NOTE — Telephone Encounter (Signed)
Left pt a message to call back to schedule vov

## 2019-05-07 ENCOUNTER — Other Ambulatory Visit: Payer: Self-pay | Admitting: Medical

## 2019-05-29 ENCOUNTER — Other Ambulatory Visit: Payer: Self-pay | Admitting: Medical

## 2019-06-03 NOTE — Progress Notes (Addendum)
Virtual Visit via Video Note  I connected with patient on 06/04/19 at 10:00 AM EDT by audio enabled telemedicine application and verified that I am speaking with the correct person using two identifiers.   THIS ENCOUNTER IS A VIRTUAL VISIT DUE TO COVID-19 - PATIENT WAS NOT SEEN IN THE OFFICE. PATIENT HAS CONSENTED TO VIRTUAL VISIT / TELEMEDICINE VISIT   Location of patient: home  Location of provider: office  I discussed the limitations of evaluation and management by telemedicine and the availability of in person appointments. The patient expressed understanding and agreed to proceed.   Subjective:   AKIEM URIETA is a 78 y.o. male who presents for Medicare Annual/Subsequent preventive examination.  Works part time in Medical laboratory scientific officer.  Review of Systems: No ROS.  Medicare Wellness Virtual Visit.  Visual/audio telehealth visit, UTA vital signs.   See social history for additional risk factors. Cardiac Risk Factors include: advanced age (>45men, >76 women);dyslipidemia;hypertension;male gender Sleep patterns: no issues Home Safety/Smoke Alarms: Feels safe in home. Smoke alarms in place.  Lives alone in 2 story home. No issue with stairs. Master on 1st floor.   Male:   CCS-  Declines    PSA-  Lab Results  Component Value Date   PSA 8.60 (H) 04/16/2018   PSA 7.28 (H) 08/07/2017   PSA 11.43 (H) 11/09/2015       Objective:     Advanced Directives 06/04/2019 06/01/2018 11/26/2014  Does Patient Have a Medical Advance Directive? Yes Yes No  Type of Advance Directive Living will;Healthcare Power of [place];Living will -  Does patient want to make changes to medical advance directive? No - Patient declined No - Patient declined -  Copy of [place] in Chart? No - copy requested No - copy requested -  Would patient like information on creating a medical advance directive? - - No - patient declined information    Tobacco  Social History   Tobacco Use  Smoking Status Never Smoker  Smokeless Tobacco Never Used     Counseling given: Not Answered   Clinical Intake: Pain : No/denies pain    Past Medical History:  Diagnosis Date  . Allergy   . Asthma    1988  . GERD (gastroesophageal reflux disease)   . Hyperlipidemia 04/26/2015  . Hypertension    Past Surgical History:  Procedure Laterality Date  . EYE SURGERY     Age 66  . HERNIA REPAIR     3 surgeries   Family History  Problem Relation Age of Onset  . Heart disease Mother   . Lymphoma Father   . Cancer Father    Social History   Socioeconomic History  . Marital status: Married    Spouse name: Not on file  . Number of children: Not on file  . Years of education: Not on file  . Highest education level: Not on file  Occupational History  . Not on file  Social Needs  . Financial resource strain: Not on file  . Food insecurity:    Worry: Not on file    Inability: Not on file  . Transportation needs:    Medical: Not on file    Non-medical: Not on file  Tobacco Use  . Smoking status: Never Smoker  . Smokeless tobacco: Never Used  Substance and Sexual Activity  . Alcohol use: Yes    Comment: 1-2 glasses of wine 4-5 days per week  . Drug use: Not Currently  .  Sexual activity: Not on file  Lifestyle  . Physical activity:    Days per week: Not on file    Minutes per session: Not on file  . Stress: Not on file  Relationships  . Social connections:    Talks on phone: Not on file    Gets together: Not on file    Attends religious service: Not on file    Active member of club or organization: Not on file    Attends meetings of clubs or organizations: Not on file    Relationship status: Not on file  Other Topics Concern  . Not on file  Social History Narrative  . Not on file    Outpatient Encounter Medications as of 06/04/2019  Medication Sig  . amLODipine (NORVASC) 5 MG tablet TAKE 1 TABLET BY MOUTH EVERY DAY  .  betamethasone dipropionate (DIPROLENE) 0.05 % ointment Apply topically 2 (two) times daily.  . fluticasone (FLONASE) 50 MCG/ACT nasal spray Place 2 sprays into both nostrils daily.  Marland Kitchen lisinopril (ZESTRIL) 10 MG tablet TAKE 2 TABLETS BY MOUTH EVERY DAY  . Multiple Vitamins-Minerals (MENS MULTIVITAMIN PLUS) TABS Take by mouth daily.  Marland Kitchen PROAIR HFA 108 (90 Base) MCG/ACT inhaler TAKE 2 PUFFS BY MOUTH EVERY 6 HOURS AS NEEDED FOR WHEEZE OR SHORTNESS OF BREATH  . simvastatin (ZOCOR) 20 MG tablet TAKE 1 TABLET (20 MG TOTAL) BY MOUTH AT BEDTIME.  . tamsulosin (FLOMAX) 0.4 MG CAPS capsule TAKE 1 CAPSULE BY MOUTH TWICE A DAY  . levocetirizine (XYZAL) 5 MG tablet TAKE 1 TABLET BY MOUTH EVERY DAY IN THE EVENING (Patient not taking: Reported on 10/07/2018)   No facility-administered encounter medications on file as of 06/04/2019.     Activities of Daily Living In your present state of health, do you have any difficulty performing the following activities: 06/04/2019  Hearing? N  Vision? N  Difficulty concentrating or making decisions? N  Walking or climbing stairs? N  Dressing or bathing? N  Doing errands, shopping? N  Preparing Food and eating ? N  Using the Toilet? N  In the past six months, have you accidently leaked urine? N  Do you have problems with loss of bowel control? N  Managing your Medications? N  Managing your Finances? N  Housekeeping or managing your Housekeeping? N  Some recent data might be hidden    Patient Care Team: [name], Iris Pert as PCP - General (Physician Assistant)   Assessment:   This is a routine wellness examination for Boris. Physical assessment deferred to PCP.  Exercise Activities and Dietary recommendations Current Exercise Habits: Home exercise routine, Type of exercise: walking, Time (Minutes): 25, Frequency (Times/Week): 3, Weekly Exercise (Minutes/Week): 75, Intensity: Mild, Exercise limited by: None identified    Diet (meal preparation, eat out, water  intake, caffeinated beverages, dairy products, fruits and vegetables): in general, a "healthy" diet  , well balanced      Goals    . Maintain healthy activity (pt-stated)       Fall Risk Fall Risk  06/01/2018 08/07/2017 02/05/2017 12/06/2016 11/09/2015  Falls in the past year? No No No No No  Comment - - - Emmi Telephone Survey: data to providers prior to load -     Depression Screen PHQ 2/9 Scores 06/01/2018 02/05/2017 11/09/2015 11/08/2014  PHQ - 2 Score 0 0 0 0    Cognitive Function Ad8 score reviewed for issues:  Issues making decisions:no  Less interest in hobbies / activities:no  Repeats questions, stories (family  complaining):no  Trouble using ordinary gadgets (microwave, computer, phone):no  Forgets the month or year: no  Mismanaging finances: no  Remembering appts:no  Daily problems with thinking and/or memory:no Ad8 score is=0        Immunization History  Administered Date(s) Administered  . Influenza, High Dose Seasonal PF 10/07/2018  . Influenza,inj,Quad PF,6+ Mos 11/09/2015  . Influenza-Unspecified 09/18/2016  . Pneumococcal Conjugate-13 02/05/2017  . Pneumococcal Polysaccharide-23 12/07/2015  . Tdap 07/10/2016  . Zoster 11/09/2015    Screening Tests Health Maintenance  Topic Date Due  . INFLUENZA VACCINE  07/31/2019  . TETANUS/TDAP  07/10/2026  . PNA vac Low Risk Adult  Completed       Plan:   See you next year!  Continue to eat heart healthy diet (full of fruits, vegetables, whole grains, lean protein, water--limit salt, fat, and sugar intake) and increase physical activity as tolerated.  Continue doing brain stimulating activities (puzzles, reading, adult coloring books, staying active) to keep memory sharp.   Bring a copy of your living will and/or healthcare power of attorney to your next office visit.   I have personally reviewed and noted the following in the patient's chart:   . Medical and social history . Use of alcohol, tobacco  or illicit drugs  . Current medications and supplements . Functional ability and status . Nutritional status . Physical activity . Advanced directives . List of other physicians . Hospitalizations, surgeries, and ER visits in previous 12 months . Vitals . Screenings to include cognitive, depression, and falls . Referrals and appointments  In addition, I have reviewed and discussed with patient certain preventive protocols, quality metrics, and best practice recommendations. A written personalized care plan for preventive services as well as general preventive health recommendations were provided to patient.     Shela Nevin, [place]  06/04/2019   Reviewed and Agree with assessment & Plan of RN. Mackie Pai, PA-C

## 2019-06-04 ENCOUNTER — Ambulatory Visit (INDEPENDENT_AMBULATORY_CARE_PROVIDER_SITE_OTHER): Payer: Medicare HMO | Admitting: *Deleted

## 2019-06-04 ENCOUNTER — Other Ambulatory Visit: Payer: Self-pay

## 2019-06-04 ENCOUNTER — Encounter: Payer: Self-pay | Admitting: *Deleted

## 2019-06-04 DIAGNOSIS — Z0000 Encounter for general adult medical examination without abnormal findings: Secondary | ICD-10-CM

## 2019-06-04 NOTE — Patient Instructions (Signed)
See you next year!  Continue to eat heart healthy diet (full of fruits, vegetables, whole grains, lean protein, water--limit salt, fat, and sugar intake) and increase physical activity as tolerated.  Continue doing brain stimulating activities (puzzles, reading, adult coloring books, staying active) to keep memory sharp.   Bring a copy of your living will and/or healthcare power of attorney to your next office visit.   [name] , Thank you for taking time to come for your Medicare Wellness Visit. I appreciate your ongoing commitment to your health goals. Please review the following plan we discussed and let me know if I can assist you in the future.   These are the goals we discussed: Goals    . Maintain healthy activity (pt-stated)       This is a list of the screening recommended for you and due dates:  Health Maintenance  Topic Date Due  . Flu Shot  07/31/2019  . Tetanus Vaccine  07/10/2026  . Pneumonia vaccines  Completed    Health Maintenance After Age 52 After age 59, you are at a higher risk for certain long-term diseases and infections as well as injuries from falls. Falls are a major cause of broken bones and head injuries in people who are older than age 80. Getting regular preventive care can help to keep you healthy and well. Preventive care includes getting regular testing and making lifestyle changes as recommended by your health care provider. Talk with your health care provider about:  Which screenings and tests you should have. A screening is a test that checks for a disease when you have no symptoms.  A diet and exercise plan that is right for you. What should I know about screenings and tests to prevent falls? Screening and testing are the best ways to find a health problem early. Early diagnosis and treatment give you the best chance of managing medical conditions that are common after age 98. Certain conditions and lifestyle choices may make you more likely to  have a fall. Your health care provider may recommend:  Regular vision checks. Poor vision and conditions such as cataracts can make you more likely to have a fall. If you wear glasses, make sure to get your prescription updated if your vision changes.  Medicine review. Work with your health care provider to regularly review all of the medicines you are taking, including over-the-counter medicines. Ask your health care provider about any side effects that may make you more likely to have a fall. Tell your health care provider if any medicines that you take make you feel dizzy or sleepy.  Osteoporosis screening. Osteoporosis is a condition that causes the bones to get weaker. This can make the bones weak and cause them to break more easily.  Blood pressure screening. Blood pressure changes and medicines to control blood pressure can make you feel dizzy.  Strength and balance checks. Your health care provider may recommend certain tests to check your strength and balance while standing, walking, or changing positions.  Foot health exam. Foot pain and numbness, as well as not wearing proper footwear, can make you more likely to have a fall.  Depression screening. You may be more likely to have a fall if you have a fear of falling, feel emotionally low, or feel unable to do activities that you used to do.  Alcohol use screening. Using too much alcohol can affect your balance and may make you more likely to have a fall. What actions can  I take to lower my risk of falls? General instructions  Talk with your health care provider about your risks for falling. Tell your health care provider if: ? You fall. Be sure to tell your health care provider about all falls, even ones that seem minor. ? You feel dizzy, sleepy, or off-balance.  Take over-the-counter and prescription medicines only as told by your health care provider. These include any supplements.  Eat a healthy diet and maintain a healthy  weight. A healthy diet includes low-fat dairy products, low-fat (lean) meats, and fiber from whole grains, beans, and lots of fruits and vegetables. Home safety  Remove any tripping hazards, such as rugs, cords, and clutter.  Install safety equipment such as grab bars in bathrooms and safety rails on stairs.  Keep rooms and walkways well-lit. Activity   Follow a regular exercise program to stay fit. This will help you maintain your balance. Ask your health care provider what types of exercise are appropriate for you.  If you need a cane or walker, use it as recommended by your health care provider.  Wear supportive shoes that have nonskid soles. Lifestyle  Do not drink alcohol if your health care provider tells you not to drink.  If you drink alcohol, limit how much you have: ? 0-1 drink a day for women. ? 0-2 drinks a day for men.  Be aware of how much alcohol is in your drink. In the [place] one drink equals one typical bottle of beer (12 oz), one-half glass of wine (5 oz), or one shot of hard liquor (1 oz).  Do not use any products that contain nicotine or tobacco, such as cigarettes and e-cigarettes. If you need help quitting, ask your health care provider. Summary  Having a healthy lifestyle and getting preventive care can help to protect your health and wellness after age 71.  Screening and testing are the best way to find a health problem early and help you avoid having a fall. Early diagnosis and treatment give you the best chance for managing medical conditions that are more common for people who are older than age 77.  Falls are a major cause of broken bones and head injuries in people who are older than age 18. Take precautions to prevent a fall at home.  Work with your health care provider to learn what changes you can make to improve your health and wellness and to prevent falls. This information is not intended to replace advice given to you by your health care  provider. Make sure you discuss any questions you have with your health care provider. Document Released: 10/29/2017 Document Revised: 10/29/2017 Document Reviewed: 10/29/2017 Elsevier Interactive Patient Education  2019 [employer].

## 2019-06-09 ENCOUNTER — Other Ambulatory Visit: Payer: Self-pay | Admitting: Medical

## 2019-06-16 NOTE — Patient Instructions (Addendum)
For history of htn and hyperlipidemia, we  will get cmp and lipid panel. Continue current medications and will let you know results.  For allergic rhinitis use flonase and xyzal if needed.  For hx of prostate CA see urologist.  Will get a1c for elevated sugar in past.   Follow up date to be determined after lab review.

## 2019-06-16 NOTE — Progress Notes (Signed)
Subjective:    Patient ID: Donald Porter, male    DOB: 12-26-1941, 78 y.o.   MRN: [id number]  HPI  Pt has hx of htn. BP is well controlled.  Pt has hx of allergic rhinitis. Currently on flonase and xyzal.  High cholesterol history on zocor. Last labs show controlled.  Hx of some wheezing in past but only used albuterol twice in past year.   Pt has hx of prostate cancer(hx of + biopsy 2 years ag) and sees urologist regularly.Pt was going to see urologist 1 week after last virtual visit. I have not seen those office notes. Pt states urologist stated he did not think repeat biopsy not necessary. Pt states possible turp procedure can help with some of his urinary symptoms. Pt is on flomax.   Review of Systems  Constitutional: Negative for chills, fatigue and fever.  HENT: Negative for congestion, ear pain, nosebleeds, sinus pressure, sinus pain and sore throat.   Respiratory: Negative for cough, chest tightness, shortness of breath and wheezing.   Cardiovascular: Negative for chest pain and palpitations.  Gastrointestinal: Negative for abdominal pain.  Genitourinary: Negative for dysuria, frequency, hematuria, penile pain, testicular pain and urgency.  Musculoskeletal: Negative for back pain and myalgias.  Skin: Negative for rash.  Neurological: Negative for dizziness, syncope, speech difficulty, weakness and headaches.  Hematological: Negative for adenopathy. Does not bruise/bleed easily.  Psychiatric/Behavioral: Negative for behavioral problems.   Past Medical History:  Diagnosis Date  . Allergy   . Asthma    1988  . GERD (gastroesophageal reflux disease)   . Hyperlipidemia 04/26/2015  . Hypertension      Social History   Socioeconomic History  . Marital status: Married    Spouse name: Not on file  . Number of children: Not on file  . Years of education: Not on file  . Highest education level: Not on file  Occupational History  . Not on file  Social Needs  .  Financial resource strain: Not on file  . Food insecurity    Worry: Not on file    Inability: Not on file  . Transportation needs    Medical: Not on file    Non-medical: Not on file  Tobacco Use  . Smoking status: Never Smoker  . Smokeless tobacco: Never Used  Substance and Sexual Activity  . Alcohol use: Yes    Comment: 1-2 glasses of wine 4-5 days per week  . Drug use: Not Currently  . Sexual activity: Not on file  Lifestyle  . Physical activity    Days per week: Not on file    Minutes per session: Not on file  . Stress: Not on file  Relationships  . Social Herbalist on phone: Not on file    Gets together: Not on file    Attends religious service: Not on file    Active member of club or organization: Not on file    Attends meetings of clubs or organizations: Not on file    Relationship status: Not on file  . Intimate partner violence    Fear of current or ex partner: Not on file    Emotionally abused: Not on file    Physically abused: Not on file    Forced sexual activity: Not on file  Other Topics Concern  . Not on file  Social History Narrative  . Not on file    Past Surgical History:  Procedure Laterality Date  . EYE SURGERY  Age 33  . HERNIA REPAIR     3 surgeries    Family History  Problem Relation Age of Onset  . Heart disease Mother   . Lymphoma Father   . Cancer Father     No Known Allergies  Current Outpatient Medications on File Prior to Visit  Medication Sig Dispense Refill  . amLODipine (NORVASC) 5 MG tablet TAKE 1 TABLET BY MOUTH EVERY DAY 30 tablet 0  . betamethasone dipropionate (DIPROLENE) 0.05 % ointment Apply topically 2 (two) times daily.    . fluticasone (FLONASE) 50 MCG/ACT nasal spray Place 2 sprays into both nostrils daily. 16 g 1  . levocetirizine (XYZAL) 5 MG tablet TAKE 1 TABLET BY MOUTH EVERY DAY IN THE EVENING (Patient not taking: Reported on 10/07/2018) 30 tablet 0  . lisinopril (ZESTRIL) 10 MG tablet TAKE 2  TABLETS BY MOUTH EVERY DAY 60 tablet 0  . Multiple Vitamins-Minerals (MENS MULTIVITAMIN PLUS) TABS Take by mouth daily.    Marland Kitchen PROAIR HFA 108 (90 Base) MCG/ACT inhaler TAKE 2 PUFFS BY MOUTH EVERY 6 HOURS AS NEEDED FOR WHEEZE OR SHORTNESS OF BREATH 8.5 Inhaler 0  . simvastatin (ZOCOR) 20 MG tablet TAKE 1 TABLET (20 MG TOTAL) BY MOUTH AT BEDTIME. 90 tablet 1  . tamsulosin (FLOMAX) 0.4 MG CAPS capsule TAKE 1 CAPSULE BY MOUTH TWICE A DAY 180 capsule 1   No current facility-administered medications on file prior to visit.     There were no vitals taken for this visit.      Objective:   Physical Exam  General Mental Status- Alert. General Appearance- Not in acute distress.   Skin General: Color- Normal Color. Moisture- Normal Moisture.  Neck Carotid Arteries- Normal color. Moisture- Normal Moisture. No carotid bruits. No JVD.  Chest and Lung Exam Auscultation: Breath Sounds:-Normal.  Cardiovascular Auscultation:Rythm- Regular. Murmurs & Other Heart Sounds:Auscultation of the heart reveals- No Murmurs.  Abdomen Inspection:-Inspeection Normal. Palpation/Percussion:Note:No mass. Palpation and Percussion of the abdomen reveal- Non Tender, Non Distended + BS, no rebound or guarding.  Neurologic Cranial Nerve exam:- CN III-XII intact(No nystagmus), symmetric smile. Strength:- 5/5 equal and symmetric strength both upper and lower extremities.  Lower ext- no pedal edema.     Assessment & Plan:  For history of htn and hyperlipidemia, we  will get cmp and lipid panel. Continue current medications and will let you know results.  For allergic rhinitis use flonase and xyzal if needed.  For hx of prostate CA see urologist.  Will get a1c for elevated sugar in past.   Follow up date to be determined after lab review.  Mackie Pai, PA-C

## 2019-06-17 ENCOUNTER — Other Ambulatory Visit: Payer: Self-pay

## 2019-06-17 ENCOUNTER — Encounter: Payer: Self-pay | Admitting: Medical

## 2019-06-17 ENCOUNTER — Ambulatory Visit (INDEPENDENT_AMBULATORY_CARE_PROVIDER_SITE_OTHER): Payer: Medicare HMO | Admitting: Medical

## 2019-06-17 VITALS — BP 139/59 | HR 64 | Temp 97.8°F | Resp 16 | Ht 67.0 in | Wt 181.4 lb

## 2019-06-17 DIAGNOSIS — J309 Allergic rhinitis, unspecified: Secondary | ICD-10-CM

## 2019-06-17 DIAGNOSIS — I10 Essential (primary) hypertension: Secondary | ICD-10-CM | POA: Diagnosis not present

## 2019-06-17 DIAGNOSIS — R739 Hyperglycemia, unspecified: Secondary | ICD-10-CM | POA: Diagnosis not present

## 2019-06-17 DIAGNOSIS — E785 Hyperlipidemia, unspecified: Secondary | ICD-10-CM

## 2019-06-17 DIAGNOSIS — Z8546 Personal history of malignant neoplasm of prostate: Secondary | ICD-10-CM | POA: Diagnosis not present

## 2019-06-17 LAB — LIPID PANEL
Cholesterol: 159 mg/dL (ref 0–200)
HDL: 68.3 mg/dL (ref >39.00)
LDL Cholesterol: 79 mg/dL (ref 0–99)
NonHDL: 90.67
Total CHOL/HDL Ratio: 2
Triglycerides: 58 mg/dL (ref 0.0–149.0)
VLDL: 11.6 mg/dL (ref 0.0–40.0)

## 2019-06-17 LAB — COMPREHENSIVE METABOLIC PANEL
ALT: 18 U/L (ref 0–53)
AST: 18 U/L (ref 0–37)
Albumin: 4.3 g/dL (ref 3.5–5.2)
Alkaline Phosphatase: 42 U/L (ref 39–117)
BUN: 17 mg/dL (ref 6–23)
CO2: 30 mEq/L (ref 19–32)
Calcium: 9.4 mg/dL (ref 8.4–10.5)
Chloride: 96 mEq/L (ref 96–112)
Creatinine, Ser: 0.92 mg/dL (ref 0.40–1.50)
GFR: 79.22 mL/min (ref >60.00)
Glucose, Bld: 93 mg/dL (ref 70–99)
Potassium: 4.7 mEq/L (ref 3.5–5.1)
Sodium: 132 mEq/L — ABNORMAL LOW (ref 135–145)
Total Bilirubin: 0.8 mg/dL (ref 0.2–1.2)
Total Protein: 6.9 g/dL (ref 6.0–8.3)

## 2019-06-17 LAB — HEMOGLOBIN A1C: Hgb A1c MFr Bld: 5.9 % (ref 4.6–6.5)

## 2019-06-24 ENCOUNTER — Other Ambulatory Visit: Payer: Self-pay | Admitting: Medical

## 2019-07-04 ENCOUNTER — Other Ambulatory Visit: Payer: Self-pay | Admitting: Medical

## 2019-07-18 ENCOUNTER — Other Ambulatory Visit: Payer: Self-pay | Admitting: Medical

## 2019-08-11 ENCOUNTER — Other Ambulatory Visit: Payer: Self-pay | Admitting: Medical

## 2019-08-12 ENCOUNTER — Other Ambulatory Visit: Payer: Self-pay | Admitting: Medical

## 2019-08-18 ENCOUNTER — Other Ambulatory Visit: Payer: Self-pay | Admitting: Medical

## 2019-08-21 ENCOUNTER — Other Ambulatory Visit: Payer: Self-pay | Admitting: Medical

## 2019-09-03 ENCOUNTER — Other Ambulatory Visit: Payer: Self-pay | Admitting: Medical

## 2019-09-04 ENCOUNTER — Other Ambulatory Visit: Payer: Self-pay | Admitting: Medical

## 2019-09-17 ENCOUNTER — Ambulatory Visit: Payer: Medicare HMO | Admitting: Medical

## 2019-09-19 NOTE — Progress Notes (Signed)
Subjective:    Patient ID: Donald Porter, male    DOB: 1941/10/24, 78 y.o.   MRN: [id number]  HPI   Pt has hx of htn. BP is well controlled.  Pt has hx of allergic rhinitis. Not on meds presently. He has xyzal and flonase available if he needs.  High cholesterol history on zocor. Last labs show controlled.  Pt has hx of prostate cancer(hx of + biopsy 2 years ag) and sees urologist regularly. Will see at end of October.  Counseled and recommended pt get flu vaccine today. Pt agreed to get.  Pt still walking some 3-4 days a week.   Review of Systems  Constitutional: Negative for chills, fatigue and fever.  HENT: Negative for congestion, ear discharge, ear pain, sinus pressure and sinus pain.   Respiratory: Negative for cough, chest tightness, shortness of breath and wheezing.   Cardiovascular: Negative for chest pain and palpitations.  Gastrointestinal: Negative for abdominal pain, anal bleeding, nausea and vomiting.  Musculoskeletal: Negative for back pain and neck pain.  Skin: Negative for rash.  Neurological: Negative for dizziness, speech difficulty, weakness and headaches.  Hematological: Negative for adenopathy. Does not bruise/bleed easily.  Psychiatric/Behavioral: Negative for confusion, decreased concentration, dysphoric mood, sleep disturbance and suicidal ideas.    Past Medical History:  Diagnosis Date  . Allergy   . Asthma    1988  . GERD (gastroesophageal reflux disease)   . Hyperlipidemia 04/26/2015  . Hypertension      Social History   Socioeconomic History  . Marital status: Married    Spouse name: Not on file  . Number of children: Not on file  . Years of education: Not on file  . Highest education level: Not on file  Occupational History  . Not on file  Social Needs  . Financial resource strain: Not on file  . Food insecurity    Worry: Not on file    Inability: Not on file  . Transportation needs    Medical: Not on file    Non-medical:  Not on file  Tobacco Use  . Smoking status: Never Smoker  . Smokeless tobacco: Never Used  Substance and Sexual Activity  . Alcohol use: Yes    Comment: 1-2 glasses of wine 4-5 days per week  . Drug use: Not Currently  . Sexual activity: Not on file  Lifestyle  . Physical activity    Days per week: Not on file    Minutes per session: Not on file  . Stress: Not on file  Relationships  . Social Herbalist on phone: Not on file    Gets together: Not on file    Attends religious service: Not on file    Active member of club or organization: Not on file    Attends meetings of clubs or organizations: Not on file    Relationship status: Not on file  . Intimate partner violence    Fear of current or ex partner: Not on file    Emotionally abused: Not on file    Physically abused: Not on file    Forced sexual activity: Not on file  Other Topics Concern  . Not on file  Social History Narrative  . Not on file    Past Surgical History:  Procedure Laterality Date  . EYE SURGERY     Age 88  . HERNIA REPAIR     3 surgeries    Family History  Problem Relation Age of Onset  .  Heart disease Mother   . Lymphoma Father   . Cancer Father     No Known Allergies  Current Outpatient Medications on File Prior to Visit  Medication Sig Dispense Refill  . amLODipine (NORVASC) 5 MG tablet TAKE 1 TABLET BY MOUTH EVERY DAY 30 tablet 0  . betamethasone dipropionate (DIPROLENE) 0.05 % ointment Apply topically 2 (two) times daily.    . fluticasone (FLONASE) 50 MCG/ACT nasal spray Place 2 sprays into both nostrils daily. 16 g 1  . levocetirizine (XYZAL) 5 MG tablet TAKE 1 TABLET BY MOUTH EVERY DAY IN THE EVENING 30 tablet 0  . lisinopril (ZESTRIL) 10 MG tablet TAKE 2 TABLETS BY MOUTH EVERY DAY 60 tablet 0  . Multiple Vitamins-Minerals (MENS MULTIVITAMIN PLUS) TABS Take by mouth daily.    Marland Kitchen PROAIR HFA 108 (90 Base) MCG/ACT inhaler TAKE 2 PUFFS BY MOUTH EVERY 6 HOURS AS NEEDED FOR  WHEEZE OR SHORTNESS OF BREATH 8.5 Inhaler 0  . simvastatin (ZOCOR) 20 MG tablet TAKE 1 TABLET (20 MG TOTAL) BY MOUTH AT BEDTIME. 90 tablet 1  . tamsulosin (FLOMAX) 0.4 MG CAPS capsule TAKE 1 CAPSULE BY MOUTH TWICE A DAY 180 capsule 1   No current facility-administered medications on file prior to visit.     There were no vitals taken for this visit.      Objective:   Physical Exam  General Mental Status- Alert. General Appearance- Not in acute distress.   Skin General: Color- Normal Color. Moisture- Normal Moisture.  Neck Carotid Arteries- Normal color. Moisture- Normal Moisture. No carotid bruits. No JVD.  Chest and Lung Exam Auscultation: Breath Sounds:-Normal.  Cardiovascular Auscultation:Rythm- Regular. Murmurs & Other Heart Sounds:Auscultation of the heart reveals- No Murmurs.  Abdomen Inspection:-Inspeection Normal. Palpation/Percussion:Note:No mass. Palpation and Percussion of the abdomen reveal- Non Tender, Non Distended + BS, no rebound or guarding.  Neurologic Cranial Nerve exam:- CN III-XII intact(No nystagmus), symmetric smile. Strength:- 5/5 equal and symmetric strength both upper and lower extremities.      Assessment & Plan:  For history of htn and hyperlipidemia,wewill get cmp and lipid panel. Continue current medications and will let you know results.  Flu vaccine give today.  For allergic rhinitis use flonase and xyzal if needed.  For hx of prostate CA see urologist.  Will get a1c for elevated sugar in past.   Follow up date to be determined after lab review.   Mackie Pai, PA-C

## 2019-09-19 NOTE — Patient Instructions (Addendum)
For history of htn and hyperlipidemia,wewill get cmp and lipid panel. Continue current medications and will let you know results.  Flu vaccine give today.  For allergic rhinitis use flonase and xyzal if needed.  For hx of prostate CA see urologist.  Will get a1c for elevated sugar in past.   Follow up date to be determined after lab review.

## 2019-09-20 ENCOUNTER — Other Ambulatory Visit: Payer: Self-pay

## 2019-09-20 ENCOUNTER — Encounter: Payer: Self-pay | Admitting: Medical

## 2019-09-20 ENCOUNTER — Ambulatory Visit (INDEPENDENT_AMBULATORY_CARE_PROVIDER_SITE_OTHER): Payer: Medicare HMO | Admitting: Medical

## 2019-09-20 VITALS — BP 136/64 | HR 77 | Temp 96.4°F | Resp 16 | Ht 67.5 in | Wt 180.8 lb

## 2019-09-20 DIAGNOSIS — J309 Allergic rhinitis, unspecified: Secondary | ICD-10-CM

## 2019-09-20 DIAGNOSIS — Z23 Encounter for immunization: Secondary | ICD-10-CM

## 2019-09-20 DIAGNOSIS — Z8546 Personal history of malignant neoplasm of prostate: Secondary | ICD-10-CM | POA: Diagnosis not present

## 2019-09-20 DIAGNOSIS — E785 Hyperlipidemia, unspecified: Secondary | ICD-10-CM | POA: Diagnosis not present

## 2019-09-20 DIAGNOSIS — I10 Essential (primary) hypertension: Secondary | ICD-10-CM | POA: Diagnosis not present

## 2019-09-20 DIAGNOSIS — R739 Hyperglycemia, unspecified: Secondary | ICD-10-CM

## 2019-09-20 LAB — LIPID PANEL
Cholesterol: 143 mg/dL (ref 0–200)
HDL: 60.6 mg/dL (ref >39.00)
LDL Cholesterol: 73 mg/dL (ref 0–99)
NonHDL: 82.11
Total CHOL/HDL Ratio: 2
Triglycerides: 47 mg/dL (ref 0.0–149.0)
VLDL: 9.4 mg/dL (ref 0.0–40.0)

## 2019-09-20 LAB — COMPREHENSIVE METABOLIC PANEL
ALT: 21 U/L (ref 0–53)
AST: 24 U/L (ref 0–37)
Albumin: 4 g/dL (ref 3.5–5.2)
Alkaline Phosphatase: 39 U/L (ref 39–117)
BUN: 18 mg/dL (ref 6–23)
CO2: 30 mEq/L (ref 19–32)
Calcium: 9.5 mg/dL (ref 8.4–10.5)
Chloride: 94 mEq/L — ABNORMAL LOW (ref 96–112)
Creatinine, Ser: 1.04 mg/dL (ref 0.40–1.50)
GFR: 68.72 mL/min (ref >60.00)
Glucose, Bld: 101 mg/dL — ABNORMAL HIGH (ref 70–99)
Potassium: 5 mEq/L (ref 3.5–5.1)
Sodium: 129 mEq/L — ABNORMAL LOW (ref 135–145)
Total Bilirubin: 0.8 mg/dL (ref 0.2–1.2)
Total Protein: 6.8 g/dL (ref 6.0–8.3)

## 2019-09-20 LAB — HEMOGLOBIN A1C: Hgb A1c MFr Bld: 6 % (ref 4.6–6.5)

## 2019-09-20 MED ORDER — AMLODIPINE BESYLATE 5 MG PO TABS: 5.0000 mg | ORAL_TABLET | Freq: Every day | ORAL | 3 refills | Status: DC

## 2019-09-20 MED ORDER — AMLODIPINE BESYLATE 5 MG PO TABS: 5.0000 mg | ORAL_TABLET | Freq: Every day | ORAL | 0 refills | Status: DC

## 2019-09-22 ENCOUNTER — Other Ambulatory Visit: Payer: Self-pay | Admitting: Medical

## 2019-10-22 ENCOUNTER — Other Ambulatory Visit: Payer: Self-pay | Admitting: Medical

## 2019-10-22 DIAGNOSIS — C61 Malignant neoplasm of prostate: Secondary | ICD-10-CM | POA: Diagnosis not present

## 2019-10-29 ENCOUNTER — Other Ambulatory Visit: Payer: Self-pay | Admitting: Urology

## 2019-10-29 DIAGNOSIS — R3915 Urgency of urination: Secondary | ICD-10-CM | POA: Diagnosis not present

## 2019-10-29 DIAGNOSIS — C61 Malignant neoplasm of prostate: Secondary | ICD-10-CM | POA: Diagnosis not present

## 2019-11-02 DIAGNOSIS — H5202 Hypermetropia, left eye: Secondary | ICD-10-CM | POA: Diagnosis not present

## 2019-11-02 DIAGNOSIS — H5211 Myopia, right eye: Secondary | ICD-10-CM | POA: Diagnosis not present

## 2019-11-02 DIAGNOSIS — H53032 Strabismic amblyopia, left eye: Secondary | ICD-10-CM | POA: Diagnosis not present

## 2019-11-02 DIAGNOSIS — H52223 Regular astigmatism, bilateral: Secondary | ICD-10-CM | POA: Diagnosis not present

## 2019-11-02 DIAGNOSIS — H348322 Tributary (branch) retinal vein occlusion, left eye, stable: Secondary | ICD-10-CM | POA: Diagnosis not present

## 2019-11-02 DIAGNOSIS — Z961 Presence of intraocular lens: Secondary | ICD-10-CM | POA: Diagnosis not present

## 2019-11-02 DIAGNOSIS — H35372 Puckering of macula, left eye: Secondary | ICD-10-CM | POA: Diagnosis not present

## 2019-11-02 DIAGNOSIS — H524 Presbyopia: Secondary | ICD-10-CM | POA: Diagnosis not present

## 2019-11-23 ENCOUNTER — Ambulatory Visit
Admission: RE | Admit: 2019-11-23 | Discharge: 2019-11-23 | Disposition: A | Discharge status: Home / Self Care | Payer: Medicare HMO | Source: Ambulatory Visit | Attending: Urology | Admitting: Urology

## 2019-11-23 DIAGNOSIS — R972 Elevated prostate specific antigen [PSA]: Secondary | ICD-10-CM | POA: Diagnosis not present

## 2019-11-23 DIAGNOSIS — C61 Malignant neoplasm of prostate: Secondary | ICD-10-CM

## 2019-11-23 MED ORDER — GADOBENATE DIMEGLUMINE 529 MG/ML IV SOLN
17.0000 mL | Freq: Once | INTRAVENOUS | Status: AC | PRN
  Administered 2019-11-23: 17 mL via INTRAVENOUS

## 2019-12-02 DIAGNOSIS — C61 Malignant neoplasm of prostate: Secondary | ICD-10-CM | POA: Diagnosis not present

## 2020-01-25 ENCOUNTER — Telehealth: Payer: Self-pay | Admitting: Medical

## 2020-01-25 MED ORDER — LISINOPRIL 10 MG PO TABS: 20.0000 mg | ORAL_TABLET | Freq: Every day | ORAL | 1 refills | Status: DC

## 2020-01-25 NOTE — Telephone Encounter (Signed)
Medication:lisinopril (ZESTRIL) 10 MG tablet  Has the patient contacted their pharmacy? Yes.   (If no, request that the patient contact the pharmacy for the refill.) (If yes, when and what did the pharmacy advise?) They told patient that it was sent over yesterday and today. Pt  States he only has 2 days left and wants to make sure that the meds is filled for him   Preferred Pharmacy (with phone number or street name): [facility] [address])   Agent: Please be advised that RX refills may take up to 3 business days. We ask that you follow-up with your pharmacy.

## 2020-02-04 DIAGNOSIS — C61 Malignant neoplasm of prostate: Secondary | ICD-10-CM | POA: Diagnosis not present

## 2020-02-10 DIAGNOSIS — C61 Malignant neoplasm of prostate: Secondary | ICD-10-CM | POA: Diagnosis not present

## 2020-02-17 ENCOUNTER — Other Ambulatory Visit: Payer: Self-pay | Admitting: Medical

## 2020-02-29 ENCOUNTER — Telehealth: Payer: Self-pay | Admitting: Medical

## 2020-02-29 NOTE — Chronic Care Management (AMB) (Signed)
Chronic Care Management  ° °Note ° °02/29/2020 °Name: Donald Porter MRN: [id number] DOB: 12/03/1941 ° °Donald Porter is a 78 y.o. year old male who is a primary care patient of [name], Edward, PA-C. I reached out to Gregary L Schomburg by phone today in response to a referral sent by Donald Porter's PCP, [name], Edward, PA-C.  ° °[name] was given information about Chronic Care Management services today including:  °1. CCM service includes personalized support from designated clinical staff supervised by his physician, including individualized plan of care and coordination with other care providers °2. 24/7 contact phone numbers for assistance for urgent and routine care needs. °3. Service will only be billed when office clinical staff spend 20 minutes or more in a month to coordinate care. °4. Only one practitioner may furnish and bill the service in a calendar month. °5. The patient may stop CCM services at any time (effective at the end of the month) by phone call to the office staff. °6. The patient will be responsible for cost sharing (co-pay) of up to 20% of the service fee (after annual deductible is met). ° °Patient agreed to services and verbal consent obtained.  ° °Follow up plan: ° ° °Raynicia Dukes °[employer] [facility] °

## 2020-02-29 NOTE — Progress Notes (Signed)
°  Chronic Care Management   Outreach Note  02/29/2020 Name: Donald Porter MRN: [id number] DOB: 1941-07-27  Referred by: Mackie Pai, PA-C Reason for referral : No chief complaint on file.   An unsuccessful telephone outreach was attempted today. The patient was referred to the pharmacist for assistance with care management and care coordination.   Follow Up Plan:   Raynicia Dukes [employer] Scheduler

## 2020-03-06 ENCOUNTER — Other Ambulatory Visit: Payer: Self-pay | Admitting: Medical

## 2020-03-14 ENCOUNTER — Other Ambulatory Visit: Payer: Self-pay | Admitting: Medical

## 2020-03-14 ENCOUNTER — Other Ambulatory Visit: Payer: Self-pay

## 2020-03-14 DIAGNOSIS — E785 Hyperlipidemia, unspecified: Secondary | ICD-10-CM

## 2020-03-14 DIAGNOSIS — I10 Essential (primary) hypertension: Secondary | ICD-10-CM

## 2020-03-16 ENCOUNTER — Other Ambulatory Visit: Payer: Self-pay

## 2020-03-16 ENCOUNTER — Ambulatory Visit: Payer: Medicare HMO | Admitting: Pharmacist

## 2020-03-16 DIAGNOSIS — K219 Gastro-esophageal reflux disease without esophagitis: Secondary | ICD-10-CM

## 2020-03-16 DIAGNOSIS — I10 Essential (primary) hypertension: Secondary | ICD-10-CM

## 2020-03-16 DIAGNOSIS — J45909 Unspecified asthma, uncomplicated: Secondary | ICD-10-CM

## 2020-03-16 DIAGNOSIS — E785 Hyperlipidemia, unspecified: Secondary | ICD-10-CM

## 2020-03-16 DIAGNOSIS — J309 Allergic rhinitis, unspecified: Secondary | ICD-10-CM

## 2020-03-16 NOTE — Chronic Care Management (AMB) (Addendum)
Chronic Care Management Pharmacy  Name: ITAMAR GALLIER  MRN: [id number] DOB: 04/28/1941  Chief Complaint/ HPI  [name],  79 y.o. , male presents for their Initial CCM visit with the clinical pharmacist via telephone due to COVID-19 Pandemic.  PCP : Mackie Pai, PA-C  Their chronic conditions include:  Asthma, HTN, Pre-DM, HLD, Allergies, BPH  Office Visits: 09/20/19: Visit w/ Mackie Pai, PA-C - Labs ordered (CMP, a1c, lipid). No med changes noted.   Consult Visit: 12/02/19: Urology visit w/ Dr. Gloriann Loan  11/02/19: Optometry visit w/ Dr. Prentice Docker  10/29/19: Urology visit w/ Dr. Gloriann Loan  Medications: Outpatient Encounter Medications as of 03/16/2020  Medication Sig   amLODipine (NORVASC) 5 MG tablet Take 1 tablet (5 mg total) by mouth daily.   lisinopril (ZESTRIL) 10 MG tablet TAKE 2 TABLETS BY MOUTH EVERY Neri Vieyra   Multiple Vitamins-Minerals (MENS MULTIVITAMIN PLUS) TABS Take by mouth daily.   PROAIR HFA 108 (90 Base) MCG/ACT inhaler TAKE 2 PUFFS BY MOUTH EVERY 6 HOURS AS NEEDED FOR WHEEZE OR SHORTNESS OF BREATH   simvastatin (ZOCOR) 20 MG tablet TAKE 1 TABLET (20 MG TOTAL) BY MOUTH AT BEDTIME.   tamsulosin (FLOMAX) 0.4 MG CAPS capsule TAKE 1 CAPSULE BY MOUTH TWICE A Jakub Debold   betamethasone dipropionate (DIPROLENE) 0.05 % ointment Apply topically 2 (two) times daily.   fluticasone (FLONASE) 50 MCG/ACT nasal spray Place 2 sprays into both nostrils daily. (Patient not taking: Reported on 03/16/2020)   levocetirizine (XYZAL) 5 MG tablet TAKE 1 TABLET BY MOUTH EVERY Merrianne Mccumbers IN THE EVENING (Patient not taking: Reported on 03/16/2020)   No facility-administered encounter medications on file as of 03/16/2020.   Immunization History  Administered Date(s) Administered   Fluad Quad(high Dose 65+) 09/20/2019   Influenza, High Dose Seasonal PF 10/07/2018   Influenza,inj,Quad PF,6+ Mos 11/09/2015   Influenza-Unspecified 09/18/2016   Pneumococcal Conjugate-13 02/05/2017   Pneumococcal  Polysaccharide-23 12/07/2015   Tdap 07/10/2016   Zoster 11/09/2015   Patient states he has received both COVID vaccines.  Current Diagnosis/Assessment:  Goals Addressed             This Visit's Progress    A1c goal less than 6.5%       -Pre-Diabetes a1c range is 5.7% to 6.4%. Your most recent a1c was 6.0 on 09/20/19.  -Usually the full diabetes diagnosis is given when a1c reaches 6.5% or higher.      Blood pressure goal less than 140/90       Consider getting Shingrix Vaccine       Limit the amount of carbohydrates eaten       Pharmacy Care Plan       CARE PLAN ENTRY  Current Barriers:  Chronic Disease Management support, education, and care coordination needs related to Asthma, HTN, Pre-DM, HLD, Allergies, BPH  Pharmacist Clinical Goal(s):  A1c goal <6.5% Blood pressure goal <140/90  Interventions: Comprehensive medication review performed. Limit the amount of carbs eaten Consider getting Shingrix vaccine  Patient Self Care Activities:  Patient verbalizes understanding of plan to follow as described above, Self administers medications as prescribed, Calls pharmacy for medication refills, and Calls provider office for new concerns or questions   Initial goal documentation        Social Hx:  Family used to live in [place] He is originally from [place] Widowed Retired Engineer, maintenance (IT). Owned a Biomedical engineer. Devota Pace now owner of practice. He works there part time during tax season.  Daughter who teaches  in [place]. Sees her a couple times per year. Is a [place] War Veteran  Has a pill box, but doesn't use it. He has a routine and is comfortable with it.   Asthma / Tobacco    Eosinophil count:   Lab Results  Component Value Date/Time   EOSPCT 3.9 10/30/2016 09:56 AM  %                               Eos (Absolute):  Lab Results  Component Value Date/Time   EOSABS 0.3 10/30/2016 09:56 AM    Tobacco Status:  Social History   Tobacco Use   Smoking Status Never Smoker  Smokeless Tobacco Never Used    Patient has failed these meds in past: None noted  Patient is currently controlled on the following medications: albuterol Using maintenance inhaler regularly? No (Does not have maintenance inhaler) Frequency of rescue inhaler use:  infrequently   Has not used since January 2021 He states albuterol makes him hyper  Plan -Continue current medications  Hypertension   CMP Latest Ref Rng & Units 09/20/2019 06/17/2019 10/07/2018  Glucose 70 - 99 mg/dL 101(H) 93 106(H)  BUN 6 - 23 mg/dL 18 17 19   Creatinine 0.40 - 1.50 mg/dL 1.04 0.92 0.99  Sodium 135 - 145 mEq/L 129(L) 132(L) 132(L)  Potassium 3.5 - 5.1 mEq/L 5.0 4.7 4.5  Chloride 96 - 112 mEq/L 94(L) 96 96  CO2 19 - 32 mEq/L 30 30 31   Calcium 8.4 - 10.5 mg/dL 9.5 9.4 9.6  Total Protein 6.0 - 8.3 g/dL 6.8 6.9 7.5  Total Bilirubin 0.2 - 1.2 mg/dL 0.8 0.8 1.0  Alkaline Phos 39 - 117 U/L 39 42 45  AST 0 - 37 U/L 24 18 21   ALT 0 - 53 U/L 21 18 18   GFR       68.72   79.22   77.50  BP today is:  Unable to assess due to phone visit  Office blood pressures are  BP Readings from Last 3 Encounters:  09/20/19 136/64  06/17/19 (!) 139/59  10/07/18 115/64    Patient has failed these meds in the past: None noted  Patient is currently controlled on the following medications: amlodipine 5mg  daily, lisinopril 10mg  #2 daily (got a refill today; has #60 plus)  Patient checks BP at home infrequently (has a wrist monitor; batteries are dead in current monitor)  We discussed  Possibilty of getting 20mg  tablet, but patient is happy with taking #2 of 10mg  tab  Plan  -Continue current medications  -Get new batteries for BP cuff  Pre-Diabetes   Recent Relevant Labs: Lab Results  Component Value Date/Time   HGBA1C 6.0 09/20/2019 10:12 AM   HGBA1C 5.9 06/17/2019 09:11 AM     Patient is currently controlled on the following medications: None  He does not put sugar in foods, but  is aware that sugar is in many foods. Likes orange juice, cereal, yogurt  We discussed: diet and exercise extensively  Plan -Continue control with diet and exercise  -Limit the amount of carbs eaten  Hyperlipidemia   Lipid Panel     Component Value Date/Time   CHOL 143 09/20/2019 1012   TRIG 47.0 09/20/2019 1012   HDL 60.60 09/20/2019 1012   CHOLHDL 2 09/20/2019 1012   VLDL 9.4 09/20/2019 1012   LDLCALC 73 09/20/2019 1012    LDL goal <100  The ASCVD Risk score ([place]  Jr., et al., 2013) failed to calculate for the following reasons:   The 2013 ASCVD risk score is only valid for ages 90 to 35   Patient has failed these meds in past: None noted  Patient is currently controlled on the following medications: simvastatin 20mg  daily HS   Plan -Continue current medications   Allergies    Patient has failed these meds in past: None noted  Patient is currently controlled on the following medications: None currently  Plan -Continue with current management   BPH    Patient has failed these meds in past: None noted  Patient is currently stable on the following medications: tamsulosin 0.4mg  twice daily  Overnight Urination: 2  Trouble starting stream: no Trouble emptying bladder: sometimes  Does feel that tamsulosin has helped. Once daily did not give as much relief as twice daily does now.  Hx of prostate cancer. Follows with urology regularly. Had a biopsy performed in late January, early February Will monitor for now Going back for follow up in June. May need radiation at that time. TBD.   We discussed:   Current symptoms and that he is pleased with current regimen  Plan -Continue current medications   Health Maintenance    Reviewed and discussed patient's vaccination history.    Immunization History  Administered Date(s) Administered   Fluad Quad(high Dose 65+) 09/20/2019   Influenza, High Dose Seasonal PF 10/07/2018   Influenza,inj,Quad PF,6+ Mos  11/09/2015   Influenza-Unspecified 09/18/2016   Pneumococcal Conjugate-13 02/05/2017   Pneumococcal Polysaccharide-23 12/07/2015   Tdap 07/10/2016   Zoster 11/09/2015    Plan -Recommended patient receive Shingrix vaccine in pharmacy  Miscellaneous Meds Betamethasone PRN for eczema prescribed by derm (didn't work; can't remember the last time he used it) Vitamin D occasionally (ran out and does not have any more)    Reviewed and agree  with asessment &  plan of [place].  Mackie Pai, PA-C

## 2020-03-16 NOTE — Patient Instructions (Signed)
**Note Donald-Identified via Obfuscation** Visit Information  Goals Addressed            This Visit's Progress   . A1c goal less than 6.5%       -Pre-Diabetes a1c range is 5.7% to 6.4%. Your most recent a1c was 6.0 on 09/20/19.  -Usually the full diabetes diagnosis is given when a1c reaches 6.5% or higher.     . Blood pressure goal less than 140/90      . Consider getting Shingrix Vaccine      . Limit the amount of carbohydrates eaten      . Pharmacy Care Plan       CARE PLAN ENTRY  Current Barriers:  . Chronic Disease Management support, education, and care coordination needs related to Asthma, HTN, Pre-DM, HLD, Allergies, BPH  Pharmacist Clinical Goal(s):  Donald Porter A1c goal <6.5% . Blood pressure goal <140/90  Interventions: . Comprehensive medication review performed. . Limit the amount of carbs eaten . Consider getting Shingrix vaccine  Patient Self Care Activities:  . Patient verbalizes understanding of plan to follow as described above, Self administers medications as prescribed, Calls pharmacy for medication refills, and Calls provider office for new concerns or questions   Initial goal documentation        Donald Porter was given information about Chronic Care Management services today including:  1. CCM service includes personalized support from designated clinical staff supervised by his physician, including individualized plan of care and coordination with other care providers 2. 24/7 contact phone numbers for assistance for urgent and routine care needs. 3. Standard insurance, coinsurance, copays and deductibles apply for chronic care management only during months in which we provide at least 20 minutes of these services. Most insurances cover these services at 100%, however patients may be responsible for any copay, coinsurance and/or deductible if applicable. This service may help you avoid the need for more expensive face-to-face services. 4. Only one practitioner may furnish and bill the service in a calendar  month. 5. The patient may stop CCM services at any time (effective at the end of the month) by phone call to the office staff.  Patient agreed to services and verbal consent obtained.   The patient verbalized understanding of instructions provided today and agreed to receive a mailed copy of patient instruction and/or educational materials. Telephone follow up appointment with pharmacy team member scheduled for: 03/22/20  Donald Porter, PharmD Clinical Pharmacist [facility] [phone]   Zoster Vaccine, Recombinant injection What is this medicine? ZOSTER VACCINE (ZOS ter vak SEEN) is used to prevent shingles in adults 81 years old and over. This vaccine is not used to treat shingles or nerve pain from shingles. This medicine may be used for other purposes; ask your health care provider or pharmacist if you have questions. COMMON BRAND NAME(S): [facility] What should I tell my health care provider before I take this medicine? They need to know if you have any of these conditions:  blood disorders or disease  cancer like leukemia or lymphoma  immune system problems or therapy  an unusual or allergic reaction to vaccines, other medications, foods, dyes, or preservatives  pregnant or trying to get pregnant  breast-feeding How should I use this medicine? This vaccine is for injection in a muscle. It is given by a health care professional. Talk to your pediatrician regarding the use of this medicine in children. This medicine is not approved for use in children. Overdosage: If you think you have taken  too much of this medicine contact a poison control center or emergency room at once. NOTE: This medicine is only for you. Do not share this medicine with others. What if I miss a dose? Keep appointments for follow-up (booster) doses as directed. It is important not to miss your dose. Call your doctor or health care professional if you are unable to keep an  appointment. What may interact with this medicine?  medicines that suppress your immune system  medicines to treat cancer  steroid medicines like prednisone or cortisone This list may not describe all possible interactions. Give your health care provider a list of all the medicines, herbs, non-prescription drugs, or dietary supplements you use. Also tell them if you smoke, drink alcohol, or use illegal drugs. Some items may interact with your medicine. What should I watch for while using this medicine? Visit your doctor for regular check ups. This vaccine, like all vaccines, may not fully protect everyone. What side effects may I notice from receiving this medicine? Side effects that you should report to your doctor or health care professional as soon as possible:  allergic reactions like skin rash, itching or hives, swelling of the face, lips, or tongue  breathing problems Side effects that usually do not require medical attention (report these to your doctor or health care professional if they continue or are bothersome):  chills  headache  fever  nausea, vomiting  redness, warmth, pain, swelling or itching at site where injected  tiredness This list may not describe all possible side effects. Call your doctor for medical advice about side effects. You may report side effects to FDA at 1-800-FDA-1088. Where should I keep my medicine? This vaccine is only given in a clinic, pharmacy, doctor's office, or other health care setting and will not be stored at home. NOTE: This sheet is a summary. It may not cover all possible information. If you have questions about this medicine, talk to your doctor, pharmacist, or health care provider.  2020 Elsevier/Gold Standard (2017-07-28 13:20:30)    Prediabetes Eating Plan Prediabetes is a condition that causes blood sugar (glucose) levels to be higher than normal. This increases the risk for developing diabetes. In order to prevent  diabetes from developing, your health care provider may recommend a diet and other lifestyle changes to help you:  Control your blood glucose levels.  Improve your cholesterol levels.  Manage your blood pressure. Your health care provider may recommend working with a diet and nutrition specialist (dietitian) to make a meal plan that is best for you. What are tips for following this plan? Lifestyle  Set weight loss goals with the help of your health care team. It is recommended that most people with prediabetes lose 7% of their current body weight.  Exercise for at least 30 minutes at least 5 days a week.  Attend a support group or seek ongoing support from a mental health counselor.  Take over-the-counter and prescription medicines only as told by your health care provider. Reading food labels  Read food labels to check the amount of fat, salt (sodium), and sugar in prepackaged foods. Avoid foods that have: ? Saturated fats. ? Trans fats. ? Added sugars.  Avoid foods that have more than 300 milligrams (mg) of sodium per serving. Limit your daily sodium intake to less than 2,300 mg each Claudy Abdallah. Shopping  Avoid buying pre-made and processed foods. Cooking  Cook with olive oil. Do not use butter, lard, or ghee.  Bake, broil, grill, or  boil foods. Avoid frying. Meal planning   Work with your dietitian to develop an eating plan that is right for you. This may include: ? Tracking how many calories you take in. Use a food diary, notebook, or mobile application to track what you eat at each meal. ? Using the glycemic index (GI) to plan your meals. The index tells you how quickly a food will raise your blood glucose. Choose low-GI foods. These foods take a longer time to raise blood glucose.  Consider following a Mediterranean diet. This diet includes: ? Several servings each Whitnee Orzel of fresh fruits and vegetables. ? Eating fish at least twice a week. ? Several servings each Nedda Gains of whole  grains, beans, nuts, and seeds. ? Using olive oil instead of other fats. ? Moderate alcohol consumption. ? Eating small amounts of red meat and whole-fat dairy.  If you have high blood pressure, you may need to limit your sodium intake or follow a diet such as the DASH eating plan. DASH is an eating plan that aims to lower high blood pressure. What foods are recommended? The items listed below may not be a complete list. Talk with your dietitian about what dietary choices are best for you. Grains Whole grains, such as whole-wheat or whole-grain breads, crackers, cereals, and pasta. Unsweetened oatmeal. Bulgur. Barley. Quinoa. Brown rice. Corn or whole-wheat flour tortillas or taco shells. Vegetables Lettuce. Spinach. Peas. Beets. Cauliflower. Cabbage. Broccoli. Carrots. Tomatoes. Squash. Eggplant. Herbs. Peppers. Onions. Cucumbers. Brussels sprouts. Fruits Berries. Bananas. Apples. Oranges. Grapes. Papaya. Mango. Pomegranate. Kiwi. Grapefruit. Cherries. Meats and other protein foods Seafood. Poultry without skin. Lean cuts of pork and beef. Tofu. Eggs. Nuts. Beans. Dairy Low-fat or fat-free dairy products, such as yogurt, cottage cheese, and cheese. Beverages Water. Tea. Coffee. Sugar-free or diet soda. Seltzer water. Lowfat or no-fat milk. Milk alternatives, such as soy or almond milk. Fats and oils Olive oil. Canola oil. Sunflower oil. Grapeseed oil. Avocado. Walnuts. Sweets and desserts Sugar-free or low-fat pudding. Sugar-free or low-fat ice cream and other frozen treats. Seasoning and other foods Herbs. Sodium-free spices. Mustard. Relish. Low-fat, low-sugar ketchup. Low-fat, low-sugar barbecue sauce. Low-fat or fat-free mayonnaise. What foods are not recommended? The items listed below may not be a complete list. Talk with your dietitian about what dietary choices are best for you. Grains Refined white flour and flour products, such as bread, pasta, snack foods, and  cereals. Vegetables Canned vegetables. Frozen vegetables with butter or cream sauce. Fruits Fruits canned with syrup. Meats and other protein foods Fatty cuts of meat. Poultry with skin. Breaded or fried meat. Processed meats. Dairy Full-fat yogurt, cheese, or milk. Beverages Sweetened drinks, such as sweet iced tea and soda. Fats and oils Butter. Lard. Ghee. Sweets and desserts Baked goods, such as cake, cupcakes, pastries, cookies, and cheesecake. Seasoning and other foods Spice mixes with added salt. Ketchup. Barbecue sauce. Mayonnaise. Summary  To prevent diabetes from developing, you may need to make diet and other lifestyle changes to help control blood sugar, improve cholesterol levels, and manage your blood pressure.  Set weight loss goals with the help of your health care team. It is recommended that most people with prediabetes lose 7 percent of their current body weight.  Consider following a Mediterranean diet that includes plenty of fresh fruits and vegetables, whole grains, beans, nuts, seeds, fish, lean meat, low-fat dairy, and healthy oils. This information is not intended to replace advice given to you by your health care provider. Make sure you  discuss any questions you have with your health care provider. Document Revised: 04/09/2019 Document Reviewed: 02/19/2017 Elsevier Patient Education  2020 [employer].

## 2020-03-22 ENCOUNTER — Ambulatory Visit: Payer: Medicare HMO | Admitting: Pharmacist

## 2020-03-22 ENCOUNTER — Ambulatory Visit (INDEPENDENT_AMBULATORY_CARE_PROVIDER_SITE_OTHER): Payer: Medicare HMO | Admitting: Medical

## 2020-03-22 ENCOUNTER — Encounter: Payer: Self-pay | Admitting: Medical

## 2020-03-22 ENCOUNTER — Other Ambulatory Visit: Payer: Self-pay

## 2020-03-22 VITALS — BP 117/73 | HR 80 | Temp 98.2°F | Resp 18 | Ht 66.0 in | Wt 182.2 lb

## 2020-03-22 DIAGNOSIS — I10 Essential (primary) hypertension: Secondary | ICD-10-CM

## 2020-03-22 DIAGNOSIS — Z8546 Personal history of malignant neoplasm of prostate: Secondary | ICD-10-CM | POA: Diagnosis not present

## 2020-03-22 DIAGNOSIS — E785 Hyperlipidemia, unspecified: Secondary | ICD-10-CM

## 2020-03-22 DIAGNOSIS — R739 Hyperglycemia, unspecified: Secondary | ICD-10-CM | POA: Diagnosis not present

## 2020-03-22 LAB — COMPREHENSIVE METABOLIC PANEL
ALT: 17 U/L (ref 0–53)
AST: 22 U/L (ref 0–37)
Albumin: 4.1 g/dL (ref 3.5–5.2)
Alkaline Phosphatase: 46 U/L (ref 39–117)
BUN: 16 mg/dL (ref 6–23)
CO2: 29 mEq/L (ref 19–32)
Calcium: 9.3 mg/dL (ref 8.4–10.5)
Chloride: 96 mEq/L (ref 96–112)
Creatinine, Ser: 0.99 mg/dL (ref 0.40–1.50)
GFR: 72.65 mL/min (ref >60.00)
Glucose, Bld: 95 mg/dL (ref 70–99)
Potassium: 4.6 mEq/L (ref 3.5–5.1)
Sodium: 130 mEq/L — ABNORMAL LOW (ref 135–145)
Total Bilirubin: 0.9 mg/dL (ref 0.2–1.2)
Total Protein: 7.1 g/dL (ref 6.0–8.3)

## 2020-03-22 LAB — LIPID PANEL
Cholesterol: 147 mg/dL (ref 0–200)
HDL: 60 mg/dL (ref >39.00)
LDL Cholesterol: 77 mg/dL (ref 0–99)
NonHDL: 86.75
Total CHOL/HDL Ratio: 2
Triglycerides: 50 mg/dL (ref 0.0–149.0)
VLDL: 10 mg/dL (ref 0.0–40.0)

## 2020-03-22 LAB — HEMOGLOBIN A1C: Hgb A1c MFr Bld: 5.7 % (ref 4.6–6.5)

## 2020-03-22 NOTE — Patient Instructions (Signed)
Your bp is well controlled presently. Continue current bp medications.  For high cholesterol will get cmp and lipid panel today.  For elevated sugar in past will get a1c.  For prostate cancer history, would consider meeting with oncologist.  Follow up date to be determined after lab review. Usually every 3 months.

## 2020-03-22 NOTE — Chronic Care Management (AMB) (Addendum)
Chronic Care Management Pharmacy  Name: COYT ARNN  MRN: [id number] DOB: [birth year]  Chief Complaint/ HPI  [name],  79 y.o. , male presents for their Follow-Up CCM visit with the clinical pharmacist via telephone due to COVID-19 Pandemic.  PCP : Mackie Pai, PA-C  Their chronic conditions include:  Asthma, HTN, Pre-DM, HLD, Allergies, BPH  Office Visits: 03/22/20: Visit w/ Mackie Pai, PA-C - Discussed pt's positive biopsy for prostate cancer. Pt under active surveillance. Patient to follow up with oncologist. No med changes noted. Labs ordered (cmp, a1c, lipid panel)  Consult Visit: None since last CCM Visit  Medications: Outpatient Encounter Medications as of 03/22/2020  Medication Sig   amLODipine (NORVASC) 5 MG tablet Take 1 tablet (5 mg total) by mouth daily.   betamethasone dipropionate (DIPROLENE) 0.05 % ointment Apply topically 2 (two) times daily.   fluticasone (FLONASE) 50 MCG/ACT nasal spray Place 2 sprays into both nostrils daily. (Patient not taking: Reported on 03/16/2020)   levocetirizine (XYZAL) 5 MG tablet TAKE 1 TABLET BY MOUTH EVERY Ermal Haberer IN THE EVENING (Patient not taking: Reported on 03/16/2020)   lisinopril (ZESTRIL) 10 MG tablet TAKE 2 TABLETS BY MOUTH EVERY Evellyn Tuff   Multiple Vitamins-Minerals (MENS MULTIVITAMIN PLUS) TABS Take by mouth daily.   PROAIR HFA 108 (90 Base) MCG/ACT inhaler TAKE 2 PUFFS BY MOUTH EVERY 6 HOURS AS NEEDED FOR WHEEZE OR SHORTNESS OF BREATH   simvastatin (ZOCOR) 20 MG tablet TAKE 1 TABLET (20 MG TOTAL) BY MOUTH AT BEDTIME.   tamsulosin (FLOMAX) 0.4 MG CAPS capsule TAKE 1 CAPSULE BY MOUTH TWICE A Ezequias Lard   No facility-administered encounter medications on file as of 03/22/2020.     Current Diagnosis/Assessment:  Goals Addressed   None     Pre-Diabetes   Recent Relevant Labs: Lab Results  Component Value Date/Time   HGBA1C 5.7 03/22/2020 10:27 AM   HGBA1C 6.0 09/20/2019 10:12 AM    Patient has failed these meds in  past: None noted  Patient is currently controlled on the following medications: None  Will continue to avoid sweets. Has been reading nutrition labels more since last visit. Considering getting no sugar added ice cream.  We discussed:  Lab results  Plan -Continue control with diet and exercise   Hypertension   CMP Latest Ref Rng & Units 03/22/2020 09/20/2019 06/17/2019  Glucose 70 - 99 mg/dL 95 101(H) 93  BUN 6 - 23 mg/dL 16 18 17   Creatinine 0.40 - 1.50 mg/dL 0.99 1.04 0.92  Sodium 135 - 145 mEq/L 130(L) 129(L) 132(L)  Potassium 3.5 - 5.1 mEq/L 4.6 5.0 4.7  Chloride 96 - 112 mEq/L 96 94(L) 96  CO2 19 - 32 mEq/L 29 30 30   Calcium 8.4 - 10.5 mg/dL 9.3 9.5 9.4  Total Protein 6.0 - 8.3 g/dL 7.1 6.8 6.9  Total Bilirubin 0.2 - 1.2 mg/dL 0.9 0.8 0.8  Alkaline Phos 39 - 117 U/L 46 39 42  AST 0 - 37 U/L 22 24 18   ALT 0 - 53 U/L 17 21 18   GFR      72.65   68.72   79.22  BP today is:  <130/80 as seen below (pt saw [name] today in clinic)  Office blood pressures are  BP Readings from Last 3 Encounters:  03/22/20 117/73  09/20/19 136/64  06/17/19 (!) 139/59    Patient has failed these meds in the past: None noted  Patient is currently controlled on the following medications: amlodipine 5mg  daily, lisinopril 10mg  #  2 daily   We discussed  Lab results  Plan -Continue current medications   Hyperlipidemia   Lipid Panel     Component Value Date/Time   CHOL 147 03/22/2020 1027   TRIG 50.0 03/22/2020 1027   HDL 60.00 03/22/2020 1027   CHOLHDL 2 03/22/2020 1027   VLDL 10.0 03/22/2020 1027   LDLCALC 77 03/22/2020 1027    LDL goal <100  The ASCVD Risk score ([place]., et al., 2013) failed to calculate for the following reasons:   The 2013 ASCVD risk score is only valid for ages 49 to 33   Patient has failed these meds in past: None noted  Patient is currently controlled on the following medications: simvastatin 20mg  daily HS  We discussed:   Lab results  Plan -Continue  current medications    We discussed the opportunity of adherence packaging and delivery and patient would like to remain with [facility].   Reviewed and agree with Assessment & Plan of [place].  Mackie Pai, PA-C

## 2020-03-22 NOTE — Progress Notes (Signed)
Subjective:    Patient ID: Donald Porter, male    DOB: 1941/02/15, 79 y.o.   MRN: [id number]  HPI  Pt in for follow up.  Pt had positive biopsy for prostate cancer. Pt has 3 areas that showed + by report review. Pt states urologist states active surveillance but he also has option to discuss radiation with oncologist. He does not have appointment with oncologist yet. He is still thinking about this.   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  Pt bp is well controlled presently.  He has history of high cholesterol and well controlled.  Hx of elevated sugar and last a1c controlled.    Review of Systems  Constitutional: Negative for chills, fatigue and fever.  Respiratory: Negative for cough, chest tightness, shortness of breath and wheezing.   Cardiovascular: Negative for chest pain and palpitations.  Gastrointestinal: Negative for abdominal pain.  Genitourinary: Negative for difficulty urinating, dysuria, frequency, penile pain and testicular pain.  Musculoskeletal: Negative for back pain.  Skin: Negative for rash.  Neurological: Negative for dizziness, speech difficulty, weakness and headaches.  Hematological: Negative for adenopathy. Does not bruise/bleed easily.  Psychiatric/Behavioral: Negative for behavioral problems, dysphoric mood and hallucinations.    Past Medical History:  Diagnosis Date  . Allergy   . Asthma    1988  . GERD (gastroesophageal reflux disease)   . Hyperlipidemia 04/26/2015  . Hypertension      Social History   Socioeconomic History  . Marital status: Married    Spouse name: Not on file  . Number of children: Not on file  . Years of education: Not on file  . Highest education level: Not on file  Occupational History  . Not on file  Tobacco Use  . Smoking status: Never Smoker  . Smokeless tobacco: Never Used  Substance and Sexual Activity  . Alcohol use: Yes    Comment: 1-2 glasses of wine 4-5 days  per week  . Drug use: Not Currently  . Sexual activity: Not on file  Other Topics Concern  . Not on file  Social History Narrative  . Not on file   Social Determinants of Health   Financial Resource Strain:   . Difficulty of Paying Living Expenses:   Food Insecurity:   . Worried About Charity fundraiser in the Last Year:   . Arboriculturist in the Last Year:   Transportation Needs:   . Film/video editor (Medical):   Marland Kitchen Lack of Transportation (Non-Medical):   Physical Activity:   . Days of Exercise per Week:   . Minutes of Exercise per Session:   Stress:   . Feeling of Stress :   Social Connections:   . Frequency of Communication with Friends and Family:   . Frequency of Social Gatherings with Friends and Family:   . Attends Religious Services:   . Active Member of Clubs or Organizations:   . Attends Archivist Meetings:   Marland Kitchen Marital Status:   Intimate Partner Violence:   . Fear of Current or Ex-Partner:   . Emotionally Abused:   Marland Kitchen Physically Abused:   . Sexually Abused:     Past Surgical History:  Procedure Laterality Date  . EYE SURGERY     Age 36  . HERNIA REPAIR     3 surgeries    Family History  Problem Relation Age of Onset  . Heart disease Mother   .  Lymphoma Father   . Cancer Father     No Known Allergies  Current Outpatient Medications on File Prior to Visit  Medication Sig Dispense Refill  . amLODipine (NORVASC) 5 MG tablet Take 1 tablet (5 mg total) by mouth daily. 90 tablet 3  . betamethasone dipropionate (DIPROLENE) 0.05 % ointment Apply topically 2 (two) times daily.    . fluticasone (FLONASE) 50 MCG/ACT nasal spray Place 2 sprays into both nostrils daily. (Patient not taking: Reported on 03/16/2020) 16 g 1  . levocetirizine (XYZAL) 5 MG tablet TAKE 1 TABLET BY MOUTH EVERY DAY IN THE EVENING (Patient not taking: Reported on 03/16/2020) 30 tablet 0  . lisinopril (ZESTRIL) 10 MG tablet TAKE 2 TABLETS BY MOUTH EVERY DAY 60 tablet 1  .  Multiple Vitamins-Minerals (MENS MULTIVITAMIN PLUS) TABS Take by mouth daily.    Marland Kitchen PROAIR HFA 108 (90 Base) MCG/ACT inhaler TAKE 2 PUFFS BY MOUTH EVERY 6 HOURS AS NEEDED FOR WHEEZE OR SHORTNESS OF BREATH 8.5 Inhaler 0  . simvastatin (ZOCOR) 20 MG tablet TAKE 1 TABLET (20 MG TOTAL) BY MOUTH AT BEDTIME. 90 tablet 1  . tamsulosin (FLOMAX) 0.4 MG CAPS capsule TAKE 1 CAPSULE BY MOUTH TWICE A DAY 180 capsule 1   No current facility-administered medications on file prior to visit.    BP 117/73 (BP Location: Left Arm, Patient Position: Sitting, Cuff Size: Large)   Pulse 80   Temp 98.2 F (36.8 C) (Temporal)   Resp 18   Ht 5\' 6"  (1.676 m)   Wt 182 lb 3.2 oz (82.6 kg)   SpO2 98%   BMI 29.41 kg/m       Objective:   Physical Exam  General Mental Status- Alert. General Appearance- Not in acute distress.   Skin General: Color- Normal Color. Moisture- Normal Moisture.  Neck Carotid Arteries- Normal color. Moisture- Normal Moisture. No carotid bruits. No JVD.  Chest and Lung Exam Auscultation: Breath Sounds:-Normal.  Cardiovascular Auscultation:Rythm- Regular. Murmurs & Other Heart Sounds:Auscultation of the heart reveals- No Murmurs.  Abdomen Inspection:-Inspeection Normal. Palpation/Percussion:Note:No mass. Palpation and Percussion of the abdomen reveal- Non Tender, Non Distended + BS, no rebound or guarding.  Neurologic Cranial Nerve exam:- CN III-XII intact(No nystagmus), symmetric smile. Strength:- 5/5 equal and symmetric strength both upper and lower extremities.      Assessment & Plan:  Your bp is well controlled presently. Continue current bp medications.  For high cholesterol will get cmp and lipid panel today.  For elevated sugar in past will get a1c.  For prostate cancer history, would consider meeting with oncologist.  Follow up date to be determined after lab review. Usually every 3 months.  Time spent on chart review, time with patient discussion of   treatment plans, follow up plan and documentation  25  minutes.  Mackie Pai, PA-C

## 2020-03-22 NOTE — Patient Instructions (Signed)
Visit Information  Goals Addressed            This Visit's Progress   . A1c goal less than 6.5%   On track    -Pre-Diabetes a1c range is 5.7% to 6.4%. Your most recent a1c was 5.7 on 03/22/20.  -Usually the full diabetes diagnosis is given when a1c reaches 6.5% or higher.     . Blood pressure goal less than 140/90   On track   . Pharmacy Care Plan   On track    CARE PLAN ENTRY  Current Barriers:  . Chronic Disease Management support, education, and care coordination needs related to Asthma, HTN, Pre-DM, HLD, Allergies, BPH  Pharmacist Clinical Goal(s):  Marland Kitchen A1c goal <6.5% . Blood pressure goal <140/90  Interventions: . Comprehensive medication review performed. . Limit the amount of carbs eaten . Consider getting Shingrix vaccine  Patient Self Care Activities:  . Patient verbalizes understanding of plan to follow as described above, Self administers medications as prescribed, Calls pharmacy for medication refills, and Calls provider office for new concerns or questions   Please see past updates related to this goal by clicking on the "Past Updates" button in the selected goal         The patient verbalized understanding of instructions provided today and agreed to receive a mailed copy of patient instruction and/or educational materials.  Telephone follow up appointment with pharmacy team member scheduled for: 09/20/2020  De Blanch, PharmD Clinical Pharmacist [facility] [phone]

## 2020-04-07 ENCOUNTER — Other Ambulatory Visit: Payer: Self-pay | Admitting: Medical

## 2020-04-18 DIAGNOSIS — Z961 Presence of intraocular lens: Secondary | ICD-10-CM | POA: Diagnosis not present

## 2020-04-18 DIAGNOSIS — H35372 Puckering of macula, left eye: Secondary | ICD-10-CM | POA: Diagnosis not present

## 2020-04-18 DIAGNOSIS — H2511 Age-related nuclear cataract, right eye: Secondary | ICD-10-CM | POA: Diagnosis not present

## 2020-04-18 DIAGNOSIS — H53032 Strabismic amblyopia, left eye: Secondary | ICD-10-CM | POA: Diagnosis not present

## 2020-04-18 DIAGNOSIS — Z0100 Encounter for examination of eyes and vision without abnormal findings: Secondary | ICD-10-CM | POA: Diagnosis not present

## 2020-05-01 NOTE — Progress Notes (Signed)
GU Location of Tumor / Histology: prostatic adenocarcinoma  If Prostate Cancer, Gleason Score is (3 + 4) and PSA is (16.8) in February 2021. Prostate volume: 68.75 grams.  Donald Porter presented to [facility] in 2016 as a referral from [employer], PA-C after he noted an elevated PSA and LUTS.  Biopsies of prostate (if applicable) revealed:    Past/Anticipated interventions by urology, if any: prostate biopsy X 3, referral to Dr. Tammi Klippel for consideration of radiation therapy  Past/Anticipated interventions by medical oncology, if any: no  Weight changes, if any: denies  Bowel/Bladder complaints, if any: Has taken finasteride in the past but not any longer. Reports taking Flomax as directed bid. IPSS 8. SHIM 2. Widowed in 2018 and not sexually active since. Reports having the sensation of incomplete emptying.  Denies dysuria or hematuria. Reports rare leakage if he attempts to hold urine too long.  Nausea/Vomiting, if any: no  Pain issues, if any:  denies  SAFETY ISSUES:  Prior radiation? Denies   Pacemaker/ICD? denies  Possible current pregnancy? no, male patient  Is the patient on methotrexate? denies  Current Complaints / other details:  79 year old male. Widowed. Wife passed in 2018 after 28 years of marriage. Has 2 children but only one still living. Lost son to kidney disease at the age of 66. Daughter lives in [place]. 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

## 2020-05-02 ENCOUNTER — Ambulatory Visit
Admission: RE | Admit: 2020-05-02 | Discharge: 2020-05-02 | Disposition: A | Discharge status: Home / Self Care | Payer: Medicare HMO | Source: Ambulatory Visit | Attending: Radiation Oncology | Admitting: Radiation Oncology

## 2020-05-02 ENCOUNTER — Other Ambulatory Visit: Payer: Self-pay | Admitting: Medical

## 2020-05-02 ENCOUNTER — Encounter: Payer: Self-pay | Admitting: Radiation Oncology

## 2020-05-02 ENCOUNTER — Other Ambulatory Visit: Payer: Self-pay

## 2020-05-02 VITALS — Ht 68.0 in | Wt 180.0 lb

## 2020-05-02 DIAGNOSIS — Z801 Family history of malignant neoplasm of trachea, bronchus and lung: Secondary | ICD-10-CM | POA: Diagnosis not present

## 2020-05-02 DIAGNOSIS — C61 Malignant neoplasm of prostate: Secondary | ICD-10-CM | POA: Diagnosis not present

## 2020-05-02 DIAGNOSIS — R972 Elevated prostate specific antigen [PSA]: Secondary | ICD-10-CM | POA: Diagnosis not present

## 2020-05-02 HISTORY — DX: Malignant neoplasm of prostate: C61

## 2020-05-02 NOTE — Progress Notes (Signed)
Radiation Oncology         [phone] ________________________________  Initial Outpatient Consultation - Conducted via Telephone due to current COVID-19 concerns for limiting patient exposure  Name: Donald Porter MRN: [id number]  Date: 05/02/2020  DOB: 03/04/1941  [id number], Nadene Rubins, MD   REFERRING PHYSICIAN: Lucas Mallow, MD  DIAGNOSIS: 79 y.o. gentleman with Stage T1c adenocarcinoma of the prostate with Gleason score of 3+4, and PSA of 16.8.    ICD-10-CM   1. Malignant neoplasm of prostate ([place])  C61     HISTORY OF PRESENT ILLNESS: Donald Porter is a 79 y.o. male with a diagnosis of prostate cancer. He was initially referred to Dr. Risa Grill in 11/2015 for an elevated PSA of 11.44. He underwent biopsy on 01/26/16 showing two cores of Gleason 3+3 prostate cancer. They opted for active surveillance at that time. His PSA decreased to 8.21 in 11/2016. A surveillance biopsy was performed on 03/26/17, and showed only one area of atypia, no malignancy. His PSA decreased further to 6.47 in 09/2017.  The patient's care was transitioned to Dr. Gloriann Loan around this time and he continued to be followed in active surveillance with close monitoring of the PSA. His PSA increased to 11.2 in 09/2018 and has continued to gradually rise since that time at 12.8 in 03/2019 and  Up to 16.8 in 09/2019. A prostate MRI was performed on 11/23/2019 showing a PI-RADS 3 lesion within the anterior right mid gland. The patient proceeded to MRI fusion biopsy on 02/04/20.  The prostate volume measured 68.75 cc.  Out of 16 core biopsies, 3 were positive. None of the samples from the ROI MRI lesion showed prostate cancer.  The maximum Gleason score was 3+4, and this was seen in the left base (small focus). Additionally,Gleason 3+3 was seen in the right apex and right apex lateral.  He discussed his biopsy results and treatment options with Dr. Gloriann Loan and he has kindly been referred today for further  discussion of potential radiation treatment options.  PREVIOUS RADIATION THERAPY: No  PAST MEDICAL HISTORY:  Past Medical History:  Diagnosis Date  . Allergy   . Asthma    1988  . GERD (gastroesophageal reflux disease)   . Hyperlipidemia 04/26/2015  . Hypertension   . Nuclear sclerotic cataract of left eye 08/26/2017  . Prostate cancer ([place])       PAST SURGICAL HISTORY: Past Surgical History:  Procedure Laterality Date  . EYE SURGERY     Age 73  . HERNIA REPAIR     3 surgeries  . PROSTATE BIOPSY      FAMILY HISTORY:  Family History  Problem Relation Age of Onset  . Heart disease Mother   . Lymphoma Father   . Cancer Father   . Lung cancer Maternal Uncle   . Lung cancer Maternal Uncle   . Breast cancer Neg Hx   . Colon cancer Neg Hx   . Pancreatic cancer Neg Hx   . 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  He remains quite socially involved and physically active. Social History   Socioeconomic History  . Marital status: Widowed    Spouse name: Not on file  . Number of children: 2  . Years of education: Not on file  . Highest education level: Not on file  Occupational  History  . Occupation: Optometrist    Comment: works part time  Tobacco Use  . Smoking status: Never Smoker  . Smokeless tobacco: Never Used  Substance and Sexual Activity  . Alcohol use: Yes    Alcohol/week: 2.0 standard drinks    Types: 2 Glasses of wine per week    Comment: 1-2 glasses of wine 4-5 days per week  . Drug use: Never  . Sexual activity: Not Currently    Comment: widowed in 03/29/2017 after 10 years of marriage.  Other Topics Concern  . Not on file  Social History Narrative   One living child. Lost his son at 64 to kidney disease. Daughter lives in [place].   Social Determinants of Health   Financial Resource Strain:   . Difficulty of Paying Living Expenses:    Food Insecurity:   . Worried About Charity fundraiser in the Last Year:   . Arboriculturist in the Last Year:   Transportation Needs:   . Film/video editor (Medical):   Marland Kitchen Lack of Transportation (Non-Medical):   Physical Activity:   . Days of Exercise per Week:   . Minutes of Exercise per Session:   Stress:   . Feeling of Stress :   Social Connections:   . Frequency of Communication with Friends and Family:   . Frequency of Social Gatherings with Friends and Family:   . Attends Religious Services:   . Active Member of Clubs or Organizations:   . Attends Archivist Meetings:   Marland Kitchen Marital Status:   Intimate Partner Violence:   . Fear of Current or Ex-Partner:   . Emotionally Abused:   Marland Kitchen Physically Abused:   . Sexually Abused:     ALLERGIES: Patient has no known allergies.  MEDICATIONS:  Current Outpatient Medications  Medication Sig Dispense Refill  . amLODipine (NORVASC) 5 MG tablet Take 1 tablet (5 mg total) by mouth daily. 90 tablet 3  . lisinopril (ZESTRIL) 10 MG tablet TAKE 2 TABLETS BY MOUTH EVERY DAY 60 tablet 1  . Multiple Vitamins-Minerals (MENS MULTIVITAMIN PLUS) TABS Take by mouth daily.    Marland Kitchen PROAIR HFA 108 (90 Base) MCG/ACT inhaler TAKE 2 PUFFS BY MOUTH EVERY 6 HOURS AS NEEDED FOR WHEEZE OR SHORTNESS OF BREATH 8.5 Inhaler 0  . simvastatin (ZOCOR) 20 MG tablet TAKE 1 TABLET (20 MG TOTAL) BY MOUTH AT BEDTIME. 90 tablet 1  . tamsulosin (FLOMAX) 0.4 MG CAPS capsule TAKE 1 CAPSULE BY MOUTH TWICE A DAY 180 capsule 1   No current facility-administered medications for this encounter.    REVIEW OF SYSTEMS:  On review of systems, the patient reports that he is doing well overall. He denies any chest pain, shortness of breath, cough, fevers, chills, night sweats, unintended weight changes. He denies any bowel disturbances, and denies abdominal pain, nausea or vomiting. He denies any new musculoskeletal or joint aches or pains. His IPSS was 8, indicating moderate  urinary symptoms. He endorses taking Flomax. He reports incomplete bladder emptying and rare leakage. His SHIM was 2, indicating he has severe erectile dysfunction. He notes he has not been sexually active since his wife passed away in 29-Mar-2017. A complete review of systems is obtained and is otherwise negative.    PHYSICAL EXAM:  Wt Readings from Last 3 Encounters:  05/02/20 180 lb (81.6 kg)  03-29-20 182 lb 3.2 oz (82.6 kg)  09/20/19 180 lb 12.8 oz (82 kg)   Temp Readings from Last 3 Encounters:  03/22/20 98.2 F (36.8 C) (Temporal)  09/20/19 (!) 96.4 F (35.8 C) (Temporal)  06/17/19 97.8 F (36.6 C) (Oral)   BP Readings from Last 3 Encounters:  03/22/20 117/73  09/20/19 136/64  06/17/19 (!) 139/59   Pulse Readings from Last 3 Encounters:  03/22/20 80  09/20/19 77  06/17/19 64   Pain Assessment Pain Score: 0-No pain/10  Physical exam not performed in light of telephone consult visit format.   KPS = 90  100 - Normal; no complaints; no evidence of disease. 90   - Able to carry on normal activity; minor signs or symptoms of disease. 80   - Normal activity with effort; some signs or symptoms of disease. 31   - Cares for self; unable to carry on normal activity or to do active work. 60   - Requires occasional assistance, but is able to care for most of his personal needs. 50   - Requires considerable assistance and frequent medical care. 31   - Disabled; requires special care and assistance. 70   - Severely disabled; hospital admission is indicated although death not imminent. 21   - Very sick; hospital admission necessary; active supportive treatment necessary. 10   - Moribund; fatal processes progressing rapidly. 0     - Dead  Karnofsky DA, Abelmann [place], Craver LS and Burchenal [facility] [phone]) The use of the nitrogen mustards in the palliative treatment of carcinoma: with particular reference to bronchogenic carcinoma Cancer 1 634-56  LABORATORY DATA:  Lab Results  Component  Value Date   WBC 8.5 10/30/2016   HGB 14.3 10/30/2016   HCT 42.2 10/30/2016   MCV 91.4 10/30/2016   PLT 249.0 10/30/2016   Lab Results  Component Value Date   NA 130 (L) 03/22/2020   K 4.6 03/22/2020   CL 96 03/22/2020   CO2 29 03/22/2020   Lab Results  Component Value Date   ALT 17 03/22/2020   AST 22 03/22/2020   ALKPHOS 46 03/22/2020   BILITOT 0.9 03/22/2020     RADIOGRAPHY: No results found.    IMPRESSION/PLAN: This visit was conducted via Telephone to spare the patient unnecessary potential exposure in the healthcare setting during the current COVID-19 pandemic. 1. 79 y.o. gentleman with Stage T1c adenocarcinoma of the prostate with Gleason Score of 3+4, and PSA of 16.8. We discussed the patient's workup and outlined the nature of prostate cancer in this setting. The patient's T stage, Gleason's score, and PSA put him into the favorable intermediate risk group. Accordingly, he is eligible for a variety of potential treatment options including active surveillance, brachytherapy, 5.5 weeks of external radiation, or prostatectomy.  We also discussed the role of continued active surveillance but felt that given his high performance status and excellent overall health, curative therapy is warranted.  We discussed the available radiation techniques, and focused on the details and logistics of delivery. The patient is not an ideal candidate for brachytherapy with a prostate volume of 68.75. We discussed and outlined the risks, benefits, short and long-term effects associated with radiotherapy and compared and contrasted these with prostatectomy. We discussed the role of SpaceOAR in reducing the rectal toxicity associated with radiotherapy.  He was encouraged to ask questions that were answered to his stated satisfaction.  At the end of the conversation, the patient is interested in moving forward with 5.5 weeks of external beam therapy. We will share our discussion with Dr. Gloriann Loan and make  arrangements for fiducial marker with SpaceOAR gel placement, prior  to simulation, to reduce rectal toxicity from radiotherapy. The patient appears to have a good understanding of his disease and our treatment recommendations which are of curative intent and is in agreement with the stated plan.  Therefore, we will move forward with treatment planning accordingly, in anticipation of beginning IMRT in the near future.  Given current concerns for patient exposure during the COVID-19 pandemic, this encounter was conducted via telephone. The patient was notified in advance and was offered a MyChart meeting to allow for face to face communication but unfortunately reported that he did not have the appropriate resources/technology to support such a visit and instead preferred to proceed with telephone consult. The patient has given verbal consent for this type of encounter. The time spent during this encounter was 60 minutes. The attendants for this meeting include Tyler Pita MD, Ashlyn Bruning PA-C, [place], and patient, Donald Porter. During the encounter, Tyler Pita MD, Ashlyn Bruning PA-C, and scribe, Wilburn Mylar were located at [facility].  Patient, Donald Porter was located at home.    Nicholos Johns, PA-C    Tyler Pita, MD  [facility] Direct Dial: [phone]  Fax: [phone] [email or web address]  Skype  LinkedIn  This document serves as a record of services personally performed by Tyler Pita, MD and Freeman Caldron, PA-C. It was created on their behalf by Wilburn Mylar, a trained medical scribe. The creation of this record is based on the scribe's personal observations and the provider's statements to them. This document has been checked and approved by the attending provider.

## 2020-05-02 NOTE — Progress Notes (Signed)
See progress note under physician encounter. 

## 2020-05-04 ENCOUNTER — Telehealth: Payer: Self-pay | Admitting: *Deleted

## 2020-05-04 NOTE — Telephone Encounter (Signed)
Called patient to inform of fid. marker and space oar gel placement on June 18 @ [facility] and his sim on June 22 @ 9 am @ Dr. Johny Shears Office, lvm for a return call

## 2020-05-09 ENCOUNTER — Encounter: Payer: Self-pay | Admitting: Medical Oncology

## 2020-05-09 NOTE — Progress Notes (Signed)
Spoke with patient to introduce myself as the prostate nurse navigator and discuss my role. I was unable to meet him 5/4, when he consulted with Dr. Tammi Klippel. He states the consult went well and he has chosen 5.5 weeks of radiation. He is scheduled for gold markers/SpaceOar 5/18 and CT simulation 5/22. I discussed these appointments and what to expect. He had questions about the radiation schedule, treatment and MRI. All questions were answered. I provided him with my contact information and asked him to call me with questions or concerns. He voiced understanding.

## 2020-05-10 ENCOUNTER — Other Ambulatory Visit: Payer: Self-pay | Admitting: Urology

## 2020-05-10 DIAGNOSIS — C61 Malignant neoplasm of prostate: Secondary | ICD-10-CM

## 2020-05-18 ENCOUNTER — Encounter: Payer: Self-pay | Admitting: Medical Oncology

## 2020-05-18 NOTE — Progress Notes (Signed)
Patient called with questions regarding CT simulation and MRI. I discussed the purpose of each and what the simulation entails. He states he has had 3 prostate biopsies and has never taken a sedative.If possible, he does not want to take a sedative for the gold marker. I told him, I think he will be fine but to discuss with his urologist. He [place] and doesn't have anyone to drive him and would rather do it himself. All questions were answered and I encouraged him to call me back with questions or concerns. He voiced understanding.

## 2020-05-23 DIAGNOSIS — L821 Other seborrheic keratosis: Secondary | ICD-10-CM | POA: Diagnosis not present

## 2020-05-23 DIAGNOSIS — L820 Inflamed seborrheic keratosis: Secondary | ICD-10-CM | POA: Diagnosis not present

## 2020-05-23 DIAGNOSIS — L570 Actinic keratosis: Secondary | ICD-10-CM | POA: Diagnosis not present

## 2020-05-23 DIAGNOSIS — L218 Other seborrheic dermatitis: Secondary | ICD-10-CM | POA: Diagnosis not present

## 2020-05-23 DIAGNOSIS — D485 Neoplasm of uncertain behavior of skin: Secondary | ICD-10-CM | POA: Diagnosis not present

## 2020-05-23 DIAGNOSIS — C44212 Basal cell carcinoma of skin of right ear and external auricular canal: Secondary | ICD-10-CM | POA: Diagnosis not present

## 2020-05-27 ENCOUNTER — Other Ambulatory Visit: Payer: Self-pay | Admitting: Medical

## 2020-06-08 NOTE — Progress Notes (Signed)
Subjective:   Donald Porter is a 79 y.o. male who presents for Medicare Annual/Subsequent preventive examination.  I connected with Kav[place]ae today by telephone and verified that I am speaking with the correct person using two identifiers. Location patient: home Location provider: work Persons participating in the virtual visit: patient, Marine scientist.    I discussed the limitations, risks, security and privacy concerns of performing an evaluation and management service by telephone and the availability of in person appointments. I also discussed with the patient that there may be a patient responsible charge related to this service. The patient expressed understanding and verbally consented to this telephonic visit.    Interactive audio and video telecommunications were attempted between this provider and patient, however failed, due to patient having technical difficulties OR patient did not have access to video capability.  We continued and completed visit with audio only.  Some vital signs may be absent or patient reported.   Time Spent with patient on telephone encounter: 30 minutes  Review of Systems:   Cardiac Risk Factors include: advanced age (>50men, >63 women);dyslipidemia;male gender     Objective:    Vitals: Ht 5\' 7"  (1.702 m)   Wt 180 lb (81.6 kg)   BMI 28.19 kg/m   Body mass index is 28.19 kg/m.  Advanced Directives 06/09/2020 05/02/2020 06/04/2019 06/01/2018 11/26/2014  Does Patient Have a Medical Advance Directive? Yes Yes Yes Yes No  Type of Paramedic of [place];Living will Living will;Healthcare Power of Attorney Living will;Healthcare Power of [place];Living will -  Does patient want to make changes to medical advance directive? - No - Patient declined No - Patient declined No - Patient declined -  Copy of [place] in Chart? No - copy requested No - copy requested No - copy requested No - copy requested  -  Would patient like information on creating a medical advance directive? - - - - No - patient declined information    Tobacco Social History   Tobacco Use  Smoking Status Never Smoker  Smokeless Tobacco Never Used     Counseling given: Not Answered   Clinical Intake:  Pre-visit preparation completed: No  Pain : No/denies pain     Nutritional Status: BMI 25 -29 Overweight Diabetes: No  How often do you need to have someone help you when you read instructions, pamphlets, or other written materials from your doctor or pharmacy?: 1 - Never  Interpreter Needed?: No  Information entered by :: Caroleen Hamman LPN  Past Medical History:  Diagnosis Date  . Allergy   . Asthma    1988  . GERD (gastroesophageal reflux disease)   . Hyperlipidemia 04/26/2015  . Hypertension   . Malignant neoplasm of prostate ([place]) 05/02/2020  . Nuclear sclerotic cataract of left eye 08/26/2017  . Prostate cancer [facility])    Past Surgical History:  Procedure Laterality Date  . EYE SURGERY     Age 44  . HERNIA REPAIR     3 surgeries  . PROSTATE BIOPSY     Family History  Problem Relation Age of Onset  . Heart disease Mother   . Lymphoma Father   . Cancer Father   . Lung cancer Maternal Uncle   . Lung cancer Maternal Uncle   . Breast cancer Neg Hx   . Colon cancer Neg Hx   . Pancreatic cancer Neg Hx   . Prostate cancer Neg Hx    Social History  Socioeconomic History  . Marital status: Widowed    Spouse name: Not on file  . Number of children: 2  . Years of education: Not on file  . Highest education level: Not on file  Occupational History  . Occupation: Optometrist    Comment: works part time  Tobacco Use  . Smoking status: Never Smoker  . Smokeless tobacco: Never Used  Vaping Use  . Vaping Use: Never used  Substance and Sexual Activity  . Alcohol use: Yes    Alcohol/week: 2.0 standard drinks    Types: 2 Glasses of wine per week    Comment: 1-2 glasses of wine 4-5 days per  week  . Drug use: Never  . Sexual activity: Not Currently    Comment: widowed in 2018 after 54 years of marriage.  Other Topics Concern  . Not on file  Social History Narrative   One living child. Lost his son at 10 to kidney disease. Daughter lives in [place].   Social Determinants of Health   Financial Resource Strain: Low Risk   . Difficulty of Paying Living Expenses: Not hard at all  Food Insecurity: No Food Insecurity  . Worried About Charity fundraiser in the Last Year: Never true  . Ran Out of Food in the Last Year: Never true  Transportation Needs: No Transportation Needs  . Lack of Transportation (Medical): No  . Lack of Transportation (Non-Medical): No  Physical Activity: Insufficiently Active  . Days of Exercise per Week: 3 days  . Minutes of Exercise per Session: 20 min  Stress:   . Feeling of Stress :   Social Connections: Socially Isolated  . Frequency of Communication with Friends and Family: More than three times a week  . Frequency of Social Gatherings with Friends and Family: Never  . Attends Religious Services: Never  . Active Member of Clubs or Organizations: No  . Attends Archivist Meetings: Never  . Marital Status: Widowed    Outpatient Encounter Medications as of 06/09/2020  Medication Sig  . amLODipine (NORVASC) 5 MG tablet Take 1 tablet (5 mg total) by mouth daily.  Marland Kitchen lisinopril (ZESTRIL) 10 MG tablet TAKE 2 TABLETS BY MOUTH EVERY DAY  . Multiple Vitamins-Minerals (MENS MULTIVITAMIN PLUS) TABS Take by mouth daily.  Marland Kitchen PROAIR HFA 108 (90 Base) MCG/ACT inhaler TAKE 2 PUFFS BY MOUTH EVERY 6 HOURS AS NEEDED FOR WHEEZE OR SHORTNESS OF BREATH  . simvastatin (ZOCOR) 20 MG tablet TAKE 1 TABLET (20 MG TOTAL) BY MOUTH AT BEDTIME.  . tamsulosin (FLOMAX) 0.4 MG CAPS capsule TAKE 1 CAPSULE BY MOUTH TWICE A DAY   No facility-administered encounter medications on file as of 06/09/2020.    Activities of Daily Living In your present state of health, do  you have any difficulty performing the following activities: 06/09/2020  Hearing? N  Vision? N  Difficulty concentrating or making decisions? N  Walking or climbing stairs? N  Dressing or bathing? N  Doing errands, shopping? N  Preparing Food and eating ? N  Using the Toilet? N  In the past six months, have you accidently leaked urine? N  Do you have problems with loss of bowel control? N  Managing your Medications? N  Managing your Finances? N  Housekeeping or managing your Housekeeping? N  Some recent data might be hidden    Patient Care Team: [name], Iris Pert as PCP - General (Physician Assistant) Day, Melvenia Beam, [facility] as Pharmacist (Pharmacist) Cira Rue, RN Nurse Navigator as Registered  Nurse (Medical Oncology)   Assessment:   This is a routine wellness examination for [place].  Exercise Activities and Dietary recommendations Current Exercise Habits: Home exercise routine, Type of exercise: walking, Time (Minutes): 25, Frequency (Times/Week): 3, Weekly Exercise (Minutes/Week): 75, Intensity: Mild  Goals Addressed              This Visit's Progress     Patient Stated   .  Maintain healthy activity (pt-stated)   On track     Other   .  Patient Stated        Increase walking       Fall Risk: Fall Risk  06/09/2020 06/04/2019 06/01/2018 08/07/2017 02/05/2017  Falls in the past year? - 0 No No No  Comment - - - - -  Number falls in past yr: 0 - - - -  Injury with Fall? 0 - - - -  Risk for fall due to : No Fall Risks - - - -    FALL RISK PREVENTION PERTAINING TO THE HOME:  Any stairs in or around the home? Yes  If so, are there any without handrails? Yes   Home free of loose throw rugs in walkways, pet beds, electrical cords, etc? Yes  Adequate lighting in your home to reduce risk of falls? Yes   ASSISTIVE DEVICES UTILIZED TO PREVENT FALLS:  Life alert? No  Use of a cane, walker or w/c? No  Grab bars in the bathroom? Yes  Shower chair or bench in shower? No    Elevated toilet seat or a handicapped toilet? No   TIMED UP AND GO:  Was the test performed? No .  Virtual Visit    Depression Screen PHQ 2/9 Scores 06/09/2020 06/04/2019 06/01/2018 02/05/2017  PHQ - 2 Score 0 0 0 0    Cognitive Function No cognitive impairment noted. Patient works part time as an Paediatric nurse tax returns.        Immunization History  Administered Date(s) Administered  . Fluad Quad(high Dose 65+) 09/20/2019  . Influenza, High Dose Seasonal PF 10/07/2018  . Influenza,inj,Quad PF,6+ Mos 11/09/2015  . Influenza-Unspecified 09/18/2016  . PFIZER SARS-COV-2 Vaccination 01/08/2020, 01/29/2020  . Pneumococcal Conjugate-13 02/05/2017  . Pneumococcal Polysaccharide-23 12/07/2015  . Tdap 07/10/2016  . Zoster 11/09/2015    Qualifies for Shingles Vaccine? Yes  Zostavax completed 11/09/2015. Due for Shingrix. Education has been provided regarding the importance of this vaccine. Pt has been advised to call insurance company to determine out of pocket expense. Advised may also receive vaccine at local pharmacy or Health Dept. Verbalized acceptance and understanding.  Tdap: Completed 07/10/2016  Flu Vaccine: Due 08/2020  Pneumococcal Vaccine:  Completed Pneumovax 23-12/07/2015 Completed Prevnar 13-02/05/2017  Covid-19 Vaccine: Completed 01/29/2020  Screening Tests Health Maintenance  Topic Date Due  . INFLUENZA VACCINE  07/30/2020  . TETANUS/TDAP  07/10/2026  . COVID-19 Vaccine  Completed  . PNA vac Low Risk Adult  Completed   Cancer Screenings:  Colorectal Screening:No longer required due to age  Lung Cancer Screening: (Low Dose CT Chest recommended if Age 82-80 years, 30 pack-year currently smoking OR have quit w/in 15years.) does not qualify.    Additional Screening:  Hepatitis C Screening: does not qualify  Vision Screening: Recommended annual ophthalmology exams for early detection of glaucoma and other disorders of the eye. Is the patient up to date  with their annual eye exam?  Yes  Who is the provider or what is the name of the office in which  the pt attends annual eye exams? Dr. Prentice Docker   Dental Screening: Recommended annual dental exams for proper oral hygiene  Community Resource Referral:  CRR required this visit?  No        Plan:  I have personally reviewed and addressed the Medicare Annual Wellness questionnaire and have noted the following in the patient's chart:  A. Medical and social history B. Use of alcohol, tobacco or illicit drugs  C. Current medications and supplements D. Functional ability and status E.  Nutritional status F.  Physical activity G. Advance directives H. List of other physicians I.  Hospitalizations, surgeries, and ER visits in previous 12 months J.  [place] such as hearing and vision if needed, cognitive and depression L. Referrals and appointments   In addition, I have reviewed and discussed with patient certain preventive protocols, quality metrics, and best practice recommendations. A written personalized care plan for preventive services as well as general preventive health recommendations were provided to patient.  Due to this being a telephonic visit, the after visit summary with patients personalized plan was offered to patient via mail. Patient preferred to pick up at office at next visit.   Signed,   Marta Antu, LPN  [id number] Nurse Health Advisor   Nurse Notes: None

## 2020-06-09 ENCOUNTER — Ambulatory Visit (INDEPENDENT_AMBULATORY_CARE_PROVIDER_SITE_OTHER): Payer: Medicare HMO | Admitting: *Deleted

## 2020-06-09 ENCOUNTER — Encounter: Payer: Self-pay | Admitting: *Deleted

## 2020-06-09 VITALS — Ht 67.0 in | Wt 180.0 lb

## 2020-06-09 DIAGNOSIS — Z0000 Encounter for general adult medical examination without abnormal findings: Secondary | ICD-10-CM

## 2020-06-09 NOTE — Patient Instructions (Signed)
[name] , Thank you for taking time to come for your Medicare Wellness Visit. I appreciate your ongoing commitment to your health goals. Please review the following plan we discussed and let me know if I can assist you in the future.   Screening recommendations/referrals: Colonoscopy: No longer indicated Recommended yearly ophthalmology/optometry visit for glaucoma screening and checkup Recommended yearly dental visit for hygiene and checkup  Vaccinations: Influenza vaccine: Die 08/2020 Pneumococcal vaccine: Completed 02/05/2017 Tdap vaccine: Completed 07/10/2016 Shingles vaccine: Discuss with Pharmacy   Covid-19: Completed 01/28/2018  Advanced directives: Information mailed  Conditions/risks identified: See problem list  Next appointment: Follow up in one year for your annual wellness visit. 06/11/2021 @ 9am  Preventive Care 81 Years and Older, Male Preventive care refers to lifestyle choices and visits with your health care provider that can promote health and wellness. What does preventive care include?  A yearly physical exam. This is also called an annual well check.  Dental exams once or twice a year.  Routine eye exams. Ask your health care provider how often you should have your eyes checked.  Personal lifestyle choices, including:  Daily care of your teeth and gums.  Regular physical activity.  Eating a healthy diet.  Avoiding tobacco and drug use.  Limiting alcohol use.  Practicing safe sex.  Taking low doses of aspirin every day.  Taking vitamin and mineral supplements as recommended by your health care provider. What happens during an annual well check? The services and screenings done by your health care provider during your annual well check will depend on your age, overall health, lifestyle risk factors, and family history of disease. Counseling  Your health care provider may ask you questions about your:  Alcohol use.  Tobacco use.  Drug  use.  Emotional well-being.  Home and relationship well-being.  Sexual activity.  Eating habits.  History of falls.  Memory and ability to understand (cognition).  Work and work Statistician. Screening  You may have the following tests or measurements:  Height, weight, and BMI.  Blood pressure.  Lipid and cholesterol levels. These may be checked every 5 years, or more frequently if you are over 64 years old.  Skin check.  Lung cancer screening. You may have this screening every year starting at age 81 if you have a 30-pack-year history of smoking and currently smoke or have quit within the past 15 years.  Fecal occult blood test (FOBT) of the stool. You may have this test every year starting at age 81.  Flexible sigmoidoscopy or colonoscopy. You may have a sigmoidoscopy every 5 years or a colonoscopy every 10 years starting at age 81.  Prostate cancer screening. Recommendations will vary depending on your family history and other risks.  Hepatitis C blood test.  Hepatitis B blood test.  Sexually transmitted disease (STD) testing.  Diabetes screening. This is done by checking your blood sugar (glucose) after you have not eaten for a while (fasting). You may have this done every 1-3 years.  Abdominal aortic aneurysm (AAA) screening. You may need this if you are a current or former smoker.  Osteoporosis. You may be screened starting at age 81 if you are at high risk. Talk with your health care provider about your test results, treatment options, and if necessary, the need for more tests. Vaccines  Your health care provider may recommend certain vaccines, such as:  Influenza vaccine. This is recommended every year.  Tetanus, diphtheria, and acellular pertussis (Tdap, Td) vaccine. You may  need a Td booster every 10 years.  Zoster vaccine. You may need this after age 81.  Pneumococcal 13-valent conjugate (PCV13) vaccine. One dose is recommended after age  81.  Pneumococcal polysaccharide (PPSV23) vaccine. One dose is recommended after age 81. Talk to your health care provider about which screenings and vaccines you need and how often you need them. This information is not intended to replace advice given to you by your health care provider. Make sure you discuss any questions you have with your health care provider. Document Released: 01/12/2016 Document Revised: 09/04/2016 Document Reviewed: 10/17/2015 Elsevier Interactive Patient Education  2017 [employer] Prevention in the Home Falls can cause injuries. They can happen to people of all ages. There are many things you can do to make your home safe and to help prevent falls. What can I do on the outside of my home?  Regularly fix the edges of walkways and driveways and fix any cracks.  Remove anything that might make you trip as you walk through a door, such as a raised step or threshold.  Trim any bushes or trees on the path to your home.  Use bright outdoor lighting.  Clear any walking paths of anything that might make someone trip, such as rocks or tools.  Regularly check to see if handrails are loose or broken. Make sure that both sides of any steps have handrails.  Any raised decks and porches should have guardrails on the edges.  Have any leaves, snow, or ice cleared regularly.  Use sand or salt on walking paths during winter.  Clean up any spills in your garage right away. This includes oil or grease spills. What can I do in the bathroom?  Use night lights.  Install grab bars by the toilet and in the tub and shower. Do not use towel bars as grab bars.  Use non-skid mats or decals in the tub or shower.  If you need to sit down in the shower, use a plastic, non-slip stool.  Keep the floor dry. Clean up any water that spills on the floor as soon as it happens.  Remove soap buildup in the tub or shower regularly.  Attach bath mats securely with double-sided  non-slip rug tape.  Do not have throw rugs and other things on the floor that can make you trip. What can I do in the bedroom?  Use night lights.  Make sure that you have a light by your bed that is easy to reach.  Do not use any sheets or blankets that are too big for your bed. They should not hang down onto the floor.  Have a firm chair that has side arms. You can use this for support while you get dressed.  Do not have throw rugs and other things on the floor that can make you trip. What can I do in the kitchen?  Clean up any spills right away.  Avoid walking on wet floors.  Keep items that you use a lot in easy-to-reach places.  If you need to reach something above you, use a strong step stool that has a grab bar.  Keep electrical cords out of the way.  Do not use floor polish or wax that makes floors slippery. If you must use wax, use non-skid floor wax.  Do not have throw rugs and other things on the floor that can make you trip. What can I do with my stairs?  Do not leave any items on  the stairs.  Make sure that there are handrails on both sides of the stairs and use them. Fix handrails that are broken or loose. Make sure that handrails are as long as the stairways.  Check any carpeting to make sure that it is firmly attached to the stairs. Fix any carpet that is loose or worn.  Avoid having throw rugs at the top or bottom of the stairs. If you do have throw rugs, attach them to the floor with carpet tape.  Make sure that you have a light switch at the top of the stairs and the bottom of the stairs. If you do not have them, ask someone to add them for you. What else can I do to help prevent falls?  Wear shoes that:  Do not have high heels.  Have rubber bottoms.  Are comfortable and fit you well.  Are closed at the toe. Do not wear sandals.  If you use a stepladder:  Make sure that it is fully opened. Do not climb a closed stepladder.  Make sure that both  sides of the stepladder are locked into place.  Ask someone to hold it for you, if possible.  Clearly mark and make sure that you can see:  Any grab bars or handrails.  First and last steps.  Where the edge of each step is.  Use tools that help you move around (mobility aids) if they are needed. These include:  Canes.  Walkers.  Scooters.  Crutches.  Turn on the lights when you go into a dark area. Replace any light bulbs as soon as they burn out.  Set up your furniture so you have a clear path. Avoid moving your furniture around.  If any of your floors are uneven, fix them.  If there are any pets around you, be aware of where they are.  Review your medicines with your doctor. Some medicines can make you feel dizzy. This can increase your chance of falling. Ask your doctor what other things that you can do to help prevent falls. This information is not intended to replace advice given to you by your health care provider. Make sure you discuss any questions you have with your health care provider. Document Released: 10/12/2009 Document Revised: 05/23/2016 Document Reviewed: 01/20/2015 Elsevier Interactive Patient Education  2017 [employer].

## 2020-06-16 DIAGNOSIS — C61 Malignant neoplasm of prostate: Secondary | ICD-10-CM | POA: Diagnosis not present

## 2020-06-19 ENCOUNTER — Telehealth: Payer: Self-pay | Admitting: *Deleted

## 2020-06-19 NOTE — Telephone Encounter (Signed)
Called patient to remind of sim and MRI for 06-20-20, spoke with patient and he is aware of these appts.

## 2020-06-20 ENCOUNTER — Ambulatory Visit
Admission: RE | Admit: 2020-06-20 | Discharge: 2020-06-20 | Disposition: A | Discharge status: Home / Self Care | Payer: Medicare HMO | Source: Ambulatory Visit | Attending: Radiation Oncology | Admitting: Radiation Oncology

## 2020-06-20 ENCOUNTER — Other Ambulatory Visit: Payer: Self-pay

## 2020-06-20 ENCOUNTER — Encounter: Payer: Self-pay | Admitting: Medical Oncology

## 2020-06-20 ENCOUNTER — Ambulatory Visit (HOSPITAL_COMMUNITY)
Admission: RE | Admit: 2020-06-20 | Discharge: 2020-06-20 | Disposition: A | Discharge status: Home / Self Care | Payer: Medicare HMO | Source: Ambulatory Visit | Attending: Urology | Admitting: Urology

## 2020-06-20 DIAGNOSIS — C61 Malignant neoplasm of prostate: Secondary | ICD-10-CM | POA: Diagnosis not present

## 2020-06-22 ENCOUNTER — Ambulatory Visit: Payer: Medicare HMO | Admitting: Medical

## 2020-06-24 ENCOUNTER — Other Ambulatory Visit: Payer: Self-pay | Admitting: Medical

## 2020-06-24 NOTE — Progress Notes (Signed)
°  Radiation Oncology         [phone] ________________________________  Name: SHMUEL GIRGIS MRN: [id number]  Date: 06/20/2020  DOB: August 13, 1941  SIMULATION AND TREATMENT PLANNING NOTE    ICD-10-CM   1. Malignant neoplasm of prostate ([place])  C61     DIAGNOSIS:  79 y.o. gentleman with Stage T1c adenocarcinoma of the prostate with Gleason score of 3+4, and PSA of 16.8.  NARRATIVE:  The patient was brought to the [facility].  Identity was confirmed.  All relevant records and images related to the planned course of therapy were reviewed.  The patient freely provided informed written consent to proceed with treatment after reviewing the details related to the planned course of therapy. The consent form was witnessed and verified by the simulation staff.  Then, the patient was set-up in a stable reproducible supine position for radiation therapy.  A vacuum lock pillow device was custom fabricated to position his legs in a reproducible immobilized position.  Then, I performed a urethrogram under sterile conditions to identify the prostatic apex.  CT images were obtained.  Surface markings were placed.  The CT images were loaded into the planning software.  Then the prostate target and avoidance structures including the rectum, bladder, bowel and hips were contoured.  Treatment planning then occurred.  The radiation prescription was entered and confirmed.  A total of one complex treatment devices was fabricated. I have requested : Intensity Modulated Radiotherapy (IMRT) is medically necessary for this case for the following reason:  Rectal sparing.Marland Kitchen  PLAN:  The patient will receive 70 Gy in 28 fractions.  ________________________________  Sheral Apley Tammi Klippel, M.D.

## 2020-06-27 DIAGNOSIS — C61 Malignant neoplasm of prostate: Secondary | ICD-10-CM | POA: Diagnosis not present

## 2020-06-29 ENCOUNTER — Other Ambulatory Visit: Payer: Self-pay

## 2020-06-29 ENCOUNTER — Ambulatory Visit
Admission: RE | Admit: 2020-06-29 | Discharge: 2020-06-29 | Disposition: A | Discharge status: Home / Self Care | Payer: Medicare HMO | Source: Ambulatory Visit | Attending: Radiation Oncology | Admitting: Radiation Oncology

## 2020-06-29 DIAGNOSIS — Z510 Encounter for antineoplastic radiation therapy: Secondary | ICD-10-CM | POA: Insufficient documentation

## 2020-06-29 DIAGNOSIS — C61 Malignant neoplasm of prostate: Secondary | ICD-10-CM | POA: Insufficient documentation

## 2020-06-30 ENCOUNTER — Ambulatory Visit
Admission: RE | Admit: 2020-06-30 | Discharge: 2020-06-30 | Disposition: A | Discharge status: Home / Self Care | Payer: Medicare HMO | Source: Ambulatory Visit | Attending: Radiation Oncology | Admitting: Radiation Oncology

## 2020-06-30 ENCOUNTER — Other Ambulatory Visit: Payer: Self-pay

## 2020-06-30 DIAGNOSIS — Z510 Encounter for antineoplastic radiation therapy: Secondary | ICD-10-CM | POA: Diagnosis not present

## 2020-06-30 DIAGNOSIS — C61 Malignant neoplasm of prostate: Secondary | ICD-10-CM | POA: Diagnosis not present

## 2020-07-04 ENCOUNTER — Other Ambulatory Visit: Payer: Self-pay

## 2020-07-04 ENCOUNTER — Ambulatory Visit
Admission: RE | Admit: 2020-07-04 | Discharge: 2020-07-04 | Disposition: A | Discharge status: Home / Self Care | Payer: Medicare HMO | Source: Ambulatory Visit | Attending: Radiation Oncology | Admitting: Radiation Oncology

## 2020-07-04 DIAGNOSIS — Z510 Encounter for antineoplastic radiation therapy: Secondary | ICD-10-CM | POA: Diagnosis not present

## 2020-07-04 DIAGNOSIS — C61 Malignant neoplasm of prostate: Secondary | ICD-10-CM | POA: Diagnosis not present

## 2020-07-05 ENCOUNTER — Other Ambulatory Visit: Payer: Self-pay

## 2020-07-05 ENCOUNTER — Ambulatory Visit
Admission: RE | Admit: 2020-07-05 | Discharge: 2020-07-05 | Disposition: A | Discharge status: Home / Self Care | Payer: Medicare HMO | Source: Ambulatory Visit | Attending: Radiation Oncology | Admitting: Radiation Oncology

## 2020-07-05 DIAGNOSIS — C61 Malignant neoplasm of prostate: Secondary | ICD-10-CM | POA: Diagnosis not present

## 2020-07-05 DIAGNOSIS — Z510 Encounter for antineoplastic radiation therapy: Secondary | ICD-10-CM | POA: Diagnosis not present

## 2020-07-06 ENCOUNTER — Ambulatory Visit
Admission: RE | Admit: 2020-07-06 | Discharge: 2020-07-06 | Disposition: A | Discharge status: Home / Self Care | Payer: Medicare HMO | Source: Ambulatory Visit | Attending: Radiation Oncology | Admitting: Radiation Oncology

## 2020-07-06 ENCOUNTER — Other Ambulatory Visit: Payer: Self-pay

## 2020-07-06 DIAGNOSIS — C61 Malignant neoplasm of prostate: Secondary | ICD-10-CM | POA: Diagnosis not present

## 2020-07-06 DIAGNOSIS — Z510 Encounter for antineoplastic radiation therapy: Secondary | ICD-10-CM | POA: Diagnosis not present

## 2020-07-07 ENCOUNTER — Other Ambulatory Visit: Payer: Self-pay

## 2020-07-07 ENCOUNTER — Ambulatory Visit
Admission: RE | Admit: 2020-07-07 | Discharge: 2020-07-07 | Disposition: A | Discharge status: Home / Self Care | Payer: Medicare HMO | Source: Ambulatory Visit | Attending: Radiation Oncology | Admitting: Radiation Oncology

## 2020-07-07 DIAGNOSIS — Z510 Encounter for antineoplastic radiation therapy: Secondary | ICD-10-CM | POA: Diagnosis not present

## 2020-07-07 DIAGNOSIS — C61 Malignant neoplasm of prostate: Secondary | ICD-10-CM | POA: Diagnosis not present

## 2020-07-10 ENCOUNTER — Other Ambulatory Visit: Payer: Self-pay

## 2020-07-10 ENCOUNTER — Ambulatory Visit
Admission: RE | Admit: 2020-07-10 | Discharge: 2020-07-10 | Disposition: A | Discharge status: Home / Self Care | Payer: Medicare HMO | Source: Ambulatory Visit | Attending: Radiation Oncology | Admitting: Radiation Oncology

## 2020-07-10 ENCOUNTER — Encounter: Payer: Self-pay | Admitting: Medical Oncology

## 2020-07-10 DIAGNOSIS — Z510 Encounter for antineoplastic radiation therapy: Secondary | ICD-10-CM | POA: Diagnosis not present

## 2020-07-10 DIAGNOSIS — C61 Malignant neoplasm of prostate: Secondary | ICD-10-CM | POA: Diagnosis not present

## 2020-07-11 ENCOUNTER — Ambulatory Visit
Admission: RE | Admit: 2020-07-11 | Discharge: 2020-07-11 | Disposition: A | Discharge status: Home / Self Care | Payer: Medicare HMO | Source: Ambulatory Visit | Attending: Radiation Oncology | Admitting: Radiation Oncology

## 2020-07-11 ENCOUNTER — Other Ambulatory Visit: Payer: Self-pay

## 2020-07-11 DIAGNOSIS — C61 Malignant neoplasm of prostate: Secondary | ICD-10-CM | POA: Diagnosis not present

## 2020-07-11 DIAGNOSIS — Z510 Encounter for antineoplastic radiation therapy: Secondary | ICD-10-CM | POA: Diagnosis not present

## 2020-07-12 ENCOUNTER — Other Ambulatory Visit: Payer: Self-pay

## 2020-07-12 ENCOUNTER — Ambulatory Visit
Admission: RE | Admit: 2020-07-12 | Discharge: 2020-07-12 | Disposition: A | Discharge status: Home / Self Care | Payer: Medicare HMO | Source: Ambulatory Visit | Attending: Radiation Oncology | Admitting: Radiation Oncology

## 2020-07-12 DIAGNOSIS — C61 Malignant neoplasm of prostate: Secondary | ICD-10-CM | POA: Diagnosis not present

## 2020-07-12 DIAGNOSIS — Z510 Encounter for antineoplastic radiation therapy: Secondary | ICD-10-CM | POA: Diagnosis not present

## 2020-07-13 ENCOUNTER — Other Ambulatory Visit: Payer: Self-pay

## 2020-07-13 ENCOUNTER — Ambulatory Visit
Admission: RE | Admit: 2020-07-13 | Discharge: 2020-07-13 | Disposition: A | Discharge status: Home / Self Care | Payer: Medicare HMO | Source: Ambulatory Visit | Attending: Radiation Oncology | Admitting: Radiation Oncology

## 2020-07-13 DIAGNOSIS — C61 Malignant neoplasm of prostate: Secondary | ICD-10-CM | POA: Diagnosis not present

## 2020-07-13 DIAGNOSIS — Z510 Encounter for antineoplastic radiation therapy: Secondary | ICD-10-CM | POA: Diagnosis not present

## 2020-07-14 ENCOUNTER — Ambulatory Visit
Admission: RE | Admit: 2020-07-14 | Discharge: 2020-07-14 | Disposition: A | Discharge status: Home / Self Care | Payer: Medicare HMO | Source: Ambulatory Visit | Attending: Radiation Oncology | Admitting: Radiation Oncology

## 2020-07-14 ENCOUNTER — Other Ambulatory Visit: Payer: Self-pay

## 2020-07-14 DIAGNOSIS — C61 Malignant neoplasm of prostate: Secondary | ICD-10-CM | POA: Diagnosis not present

## 2020-07-14 DIAGNOSIS — Z510 Encounter for antineoplastic radiation therapy: Secondary | ICD-10-CM | POA: Diagnosis not present

## 2020-07-17 ENCOUNTER — Other Ambulatory Visit: Payer: Self-pay

## 2020-07-17 ENCOUNTER — Ambulatory Visit
Admission: RE | Admit: 2020-07-17 | Discharge: 2020-07-17 | Disposition: A | Discharge status: Home / Self Care | Payer: Medicare HMO | Source: Ambulatory Visit | Attending: Radiation Oncology | Admitting: Radiation Oncology

## 2020-07-17 DIAGNOSIS — C61 Malignant neoplasm of prostate: Secondary | ICD-10-CM | POA: Diagnosis not present

## 2020-07-17 DIAGNOSIS — Z510 Encounter for antineoplastic radiation therapy: Secondary | ICD-10-CM | POA: Diagnosis not present

## 2020-07-18 ENCOUNTER — Other Ambulatory Visit: Payer: Self-pay

## 2020-07-18 ENCOUNTER — Ambulatory Visit
Admission: RE | Admit: 2020-07-18 | Discharge: 2020-07-18 | Disposition: A | Discharge status: Home / Self Care | Payer: Medicare HMO | Source: Ambulatory Visit | Attending: Radiation Oncology | Admitting: Radiation Oncology

## 2020-07-18 DIAGNOSIS — C61 Malignant neoplasm of prostate: Secondary | ICD-10-CM | POA: Diagnosis not present

## 2020-07-18 DIAGNOSIS — Z510 Encounter for antineoplastic radiation therapy: Secondary | ICD-10-CM | POA: Diagnosis not present

## 2020-07-19 ENCOUNTER — Other Ambulatory Visit: Payer: Self-pay

## 2020-07-19 ENCOUNTER — Ambulatory Visit
Admission: RE | Admit: 2020-07-19 | Discharge: 2020-07-19 | Disposition: A | Discharge status: Home / Self Care | Payer: Medicare HMO | Source: Ambulatory Visit | Attending: Radiation Oncology | Admitting: Radiation Oncology

## 2020-07-19 DIAGNOSIS — Z510 Encounter for antineoplastic radiation therapy: Secondary | ICD-10-CM | POA: Diagnosis not present

## 2020-07-19 DIAGNOSIS — C61 Malignant neoplasm of prostate: Secondary | ICD-10-CM | POA: Diagnosis not present

## 2020-07-20 ENCOUNTER — Other Ambulatory Visit: Payer: Self-pay

## 2020-07-20 ENCOUNTER — Ambulatory Visit
Admission: RE | Admit: 2020-07-20 | Discharge: 2020-07-20 | Disposition: A | Discharge status: Home / Self Care | Payer: Medicare HMO | Source: Ambulatory Visit | Attending: Radiation Oncology | Admitting: Radiation Oncology

## 2020-07-20 DIAGNOSIS — Z510 Encounter for antineoplastic radiation therapy: Secondary | ICD-10-CM | POA: Diagnosis not present

## 2020-07-20 DIAGNOSIS — C61 Malignant neoplasm of prostate: Secondary | ICD-10-CM | POA: Diagnosis not present

## 2020-07-21 ENCOUNTER — Ambulatory Visit
Admission: RE | Admit: 2020-07-21 | Discharge: 2020-07-21 | Disposition: A | Discharge status: Home / Self Care | Payer: Medicare HMO | Source: Ambulatory Visit | Attending: Radiation Oncology | Admitting: Radiation Oncology

## 2020-07-21 ENCOUNTER — Other Ambulatory Visit: Payer: Self-pay | Admitting: Medical

## 2020-07-21 DIAGNOSIS — Z510 Encounter for antineoplastic radiation therapy: Secondary | ICD-10-CM | POA: Diagnosis not present

## 2020-07-21 DIAGNOSIS — C61 Malignant neoplasm of prostate: Secondary | ICD-10-CM | POA: Diagnosis not present

## 2020-07-24 ENCOUNTER — Ambulatory Visit
Admission: RE | Admit: 2020-07-24 | Discharge: 2020-07-24 | Disposition: A | Discharge status: Home / Self Care | Payer: Medicare HMO | Source: Ambulatory Visit | Attending: Radiation Oncology | Admitting: Radiation Oncology

## 2020-07-24 ENCOUNTER — Other Ambulatory Visit: Payer: Self-pay

## 2020-07-24 DIAGNOSIS — C61 Malignant neoplasm of prostate: Secondary | ICD-10-CM | POA: Diagnosis not present

## 2020-07-24 DIAGNOSIS — Z510 Encounter for antineoplastic radiation therapy: Secondary | ICD-10-CM | POA: Diagnosis not present

## 2020-07-25 ENCOUNTER — Other Ambulatory Visit: Payer: Self-pay

## 2020-07-25 ENCOUNTER — Ambulatory Visit
Admission: RE | Admit: 2020-07-25 | Discharge: 2020-07-25 | Disposition: A | Discharge status: Home / Self Care | Payer: Medicare HMO | Source: Ambulatory Visit | Attending: Radiation Oncology | Admitting: Radiation Oncology

## 2020-07-25 DIAGNOSIS — Z510 Encounter for antineoplastic radiation therapy: Secondary | ICD-10-CM | POA: Diagnosis not present

## 2020-07-25 DIAGNOSIS — C61 Malignant neoplasm of prostate: Secondary | ICD-10-CM | POA: Diagnosis not present

## 2020-07-26 ENCOUNTER — Other Ambulatory Visit: Payer: Self-pay

## 2020-07-26 ENCOUNTER — Ambulatory Visit
Admission: RE | Admit: 2020-07-26 | Discharge: 2020-07-26 | Disposition: A | Discharge status: Home / Self Care | Payer: Medicare HMO | Source: Ambulatory Visit | Attending: Radiation Oncology | Admitting: Radiation Oncology

## 2020-07-26 DIAGNOSIS — Z510 Encounter for antineoplastic radiation therapy: Secondary | ICD-10-CM | POA: Diagnosis not present

## 2020-07-26 DIAGNOSIS — C61 Malignant neoplasm of prostate: Secondary | ICD-10-CM | POA: Diagnosis not present

## 2020-07-27 ENCOUNTER — Ambulatory Visit
Admission: RE | Admit: 2020-07-27 | Discharge: 2020-07-27 | Disposition: A | Discharge status: Home / Self Care | Payer: Medicare HMO | Source: Ambulatory Visit | Attending: Radiation Oncology | Admitting: Radiation Oncology

## 2020-07-27 ENCOUNTER — Other Ambulatory Visit: Payer: Self-pay

## 2020-07-27 DIAGNOSIS — C61 Malignant neoplasm of prostate: Secondary | ICD-10-CM | POA: Diagnosis not present

## 2020-07-27 DIAGNOSIS — Z510 Encounter for antineoplastic radiation therapy: Secondary | ICD-10-CM | POA: Diagnosis not present

## 2020-07-28 ENCOUNTER — Ambulatory Visit
Admission: RE | Admit: 2020-07-28 | Discharge: 2020-07-28 | Disposition: A | Discharge status: Home / Self Care | Payer: Medicare HMO | Source: Ambulatory Visit | Attending: Radiation Oncology | Admitting: Radiation Oncology

## 2020-07-28 ENCOUNTER — Other Ambulatory Visit: Payer: Self-pay

## 2020-07-28 DIAGNOSIS — C61 Malignant neoplasm of prostate: Secondary | ICD-10-CM | POA: Diagnosis not present

## 2020-07-28 DIAGNOSIS — Z510 Encounter for antineoplastic radiation therapy: Secondary | ICD-10-CM | POA: Diagnosis not present

## 2020-07-31 ENCOUNTER — Other Ambulatory Visit: Payer: Self-pay

## 2020-07-31 ENCOUNTER — Ambulatory Visit
Admission: RE | Admit: 2020-07-31 | Discharge: 2020-07-31 | Disposition: A | Discharge status: Home / Self Care | Payer: Medicare HMO | Source: Ambulatory Visit | Attending: Radiation Oncology | Admitting: Radiation Oncology

## 2020-07-31 DIAGNOSIS — Z510 Encounter for antineoplastic radiation therapy: Secondary | ICD-10-CM | POA: Diagnosis not present

## 2020-07-31 DIAGNOSIS — C61 Malignant neoplasm of prostate: Secondary | ICD-10-CM | POA: Diagnosis not present

## 2020-08-01 ENCOUNTER — Other Ambulatory Visit: Payer: Self-pay

## 2020-08-01 ENCOUNTER — Ambulatory Visit
Admission: RE | Admit: 2020-08-01 | Discharge: 2020-08-01 | Disposition: A | Discharge status: Home / Self Care | Payer: Medicare HMO | Source: Ambulatory Visit | Attending: Radiation Oncology | Admitting: Radiation Oncology

## 2020-08-01 DIAGNOSIS — Z510 Encounter for antineoplastic radiation therapy: Secondary | ICD-10-CM | POA: Diagnosis not present

## 2020-08-01 DIAGNOSIS — C61 Malignant neoplasm of prostate: Secondary | ICD-10-CM | POA: Diagnosis not present

## 2020-08-02 ENCOUNTER — Ambulatory Visit
Admission: RE | Admit: 2020-08-02 | Discharge: 2020-08-02 | Disposition: A | Discharge status: Home / Self Care | Payer: Medicare HMO | Source: Ambulatory Visit | Attending: Radiation Oncology | Admitting: Radiation Oncology

## 2020-08-02 ENCOUNTER — Other Ambulatory Visit: Payer: Self-pay

## 2020-08-02 DIAGNOSIS — C61 Malignant neoplasm of prostate: Secondary | ICD-10-CM | POA: Diagnosis not present

## 2020-08-02 DIAGNOSIS — Z510 Encounter for antineoplastic radiation therapy: Secondary | ICD-10-CM | POA: Diagnosis not present

## 2020-08-03 ENCOUNTER — Ambulatory Visit
Admission: RE | Admit: 2020-08-03 | Discharge: 2020-08-03 | Disposition: A | Discharge status: Home / Self Care | Payer: Medicare HMO | Source: Ambulatory Visit | Attending: Radiation Oncology | Admitting: Radiation Oncology

## 2020-08-03 ENCOUNTER — Other Ambulatory Visit: Payer: Self-pay

## 2020-08-03 DIAGNOSIS — Z510 Encounter for antineoplastic radiation therapy: Secondary | ICD-10-CM | POA: Diagnosis not present

## 2020-08-03 DIAGNOSIS — C61 Malignant neoplasm of prostate: Secondary | ICD-10-CM | POA: Diagnosis not present

## 2020-08-04 ENCOUNTER — Other Ambulatory Visit: Payer: Self-pay

## 2020-08-04 ENCOUNTER — Ambulatory Visit
Admission: RE | Admit: 2020-08-04 | Discharge: 2020-08-04 | Disposition: A | Discharge status: Home / Self Care | Payer: Medicare HMO | Source: Ambulatory Visit | Attending: Radiation Oncology | Admitting: Radiation Oncology

## 2020-08-04 DIAGNOSIS — C61 Malignant neoplasm of prostate: Secondary | ICD-10-CM | POA: Diagnosis not present

## 2020-08-04 DIAGNOSIS — Z510 Encounter for antineoplastic radiation therapy: Secondary | ICD-10-CM | POA: Diagnosis not present

## 2020-08-07 ENCOUNTER — Other Ambulatory Visit: Payer: Self-pay

## 2020-08-07 ENCOUNTER — Ambulatory Visit
Admission: RE | Admit: 2020-08-07 | Discharge: 2020-08-07 | Disposition: A | Discharge status: Home / Self Care | Payer: Medicare HMO | Source: Ambulatory Visit | Attending: Radiation Oncology | Admitting: Radiation Oncology

## 2020-08-07 DIAGNOSIS — C61 Malignant neoplasm of prostate: Secondary | ICD-10-CM | POA: Diagnosis not present

## 2020-08-07 DIAGNOSIS — Z510 Encounter for antineoplastic radiation therapy: Secondary | ICD-10-CM | POA: Diagnosis not present

## 2020-08-08 ENCOUNTER — Encounter: Payer: Self-pay | Admitting: Medical Oncology

## 2020-08-08 ENCOUNTER — Other Ambulatory Visit: Payer: Self-pay

## 2020-08-08 ENCOUNTER — Encounter: Payer: Self-pay | Admitting: Medical

## 2020-08-08 ENCOUNTER — Ambulatory Visit
Admission: RE | Admit: 2020-08-08 | Discharge: 2020-08-08 | Disposition: A | Discharge status: Home / Self Care | Payer: Medicare HMO | Source: Ambulatory Visit | Attending: Radiation Oncology | Admitting: Radiation Oncology

## 2020-08-08 DIAGNOSIS — C61 Malignant neoplasm of prostate: Secondary | ICD-10-CM | POA: Diagnosis not present

## 2020-08-08 DIAGNOSIS — Z510 Encounter for antineoplastic radiation therapy: Secondary | ICD-10-CM | POA: Diagnosis not present

## 2020-09-05 ENCOUNTER — Ambulatory Visit: Payer: Medicare HMO | Admitting: Medical

## 2020-09-06 ENCOUNTER — Ambulatory Visit (INDEPENDENT_AMBULATORY_CARE_PROVIDER_SITE_OTHER): Payer: Medicare HMO | Admitting: Medical

## 2020-09-06 ENCOUNTER — Other Ambulatory Visit: Payer: Self-pay

## 2020-09-06 VITALS — BP 132/63 | HR 74 | Temp 98.2°F | Resp 18 | Ht 67.0 in | Wt 179.6 lb

## 2020-09-06 DIAGNOSIS — Z8546 Personal history of malignant neoplasm of prostate: Secondary | ICD-10-CM

## 2020-09-06 DIAGNOSIS — E785 Hyperlipidemia, unspecified: Secondary | ICD-10-CM | POA: Diagnosis not present

## 2020-09-06 DIAGNOSIS — R739 Hyperglycemia, unspecified: Secondary | ICD-10-CM

## 2020-09-06 DIAGNOSIS — E1169 Type 2 diabetes mellitus with other specified complication: Secondary | ICD-10-CM | POA: Diagnosis not present

## 2020-09-06 DIAGNOSIS — N400 Enlarged prostate without lower urinary tract symptoms: Secondary | ICD-10-CM | POA: Diagnosis not present

## 2020-09-06 DIAGNOSIS — R5383 Other fatigue: Secondary | ICD-10-CM

## 2020-09-06 DIAGNOSIS — I10 Essential (primary) hypertension: Secondary | ICD-10-CM

## 2020-09-06 MED ORDER — TAMSULOSIN HCL 0.4 MG PO CAPS: 0.4000 mg | ORAL_CAPSULE | Freq: Two times a day (BID) | ORAL | 1 refills | Status: DC

## 2020-09-06 NOTE — Patient Instructions (Addendum)
Your bp is well controlled. Continue amlodipine and lisinopril.  For high cholesterol will recheck lipid panel and cmp. Refill simvastatin after level review.  For elevated sugar recheck your a1c.  For fatigue doing listed labs.   For bph refilling your flomax.  For prostate CA follow thru with oncologist.  Follow up 3 month or as needed

## 2020-09-06 NOTE — Progress Notes (Signed)
Subjective:    Patient ID: Donald Porter, male    DOB: 01/12/1941, 79 y.o.   MRN: [id number]  HPI  Pt in today for follow up.  He went thru radiation treatments. He went thru 28 treatments all together.    Pt last psa was 16.80 in October. This is when he decided to go thru radiation tx.   He will repeat psa again in November.  Pt states he is feeling fatigued.Since finishing treatments above he feels gradual improvement of energy/less fatigue.  He used to walk daily. He tried to walk but rt achilles hurts. Pain present for about 3 weeks.  Pt willing to consider sports med referral if pain lingers past a month.  Pt has hx of vaccine phizer.  Pt is fasting today. Hx of high cholesterol and mid elevated sugars in the past.  Pt has htn. Bp is well controlled.  Pt has hx of bph. Request refill of flomax.   Review of Systems  Constitutional: Positive for fatigue. Negative for chills and fever.  HENT: Negative for congestion, ear discharge, hearing loss, postnasal drip, rhinorrhea and sinus pain.   Respiratory: Negative for cough, chest tightness, shortness of breath and wheezing.   Cardiovascular: Negative for chest pain and palpitations.  Gastrointestinal: Negative for abdominal pain, constipation and diarrhea.  Musculoskeletal: Negative for back pain.  Skin: Negative for rash.  Neurological: Negative for dizziness, speech difficulty, weakness and headaches.  Hematological: Negative for adenopathy. Does not bruise/bleed easily.  Psychiatric/Behavioral: Negative for behavioral problems, decreased concentration and sleep disturbance. The patient is not nervous/anxious.     Past Medical History:  Diagnosis Date   Allergy    Asthma    1988   GERD (gastroesophageal reflux disease)    Hyperlipidemia 04/26/2015   Hypertension    Malignant neoplasm of prostate ([place]) 05/02/2020   Nuclear sclerotic cataract of left eye 08/26/2017   Prostate cancer [facility])      Social  History   Socioeconomic History   Marital status: Widowed    Spouse name: Not on file   Number of children: 2   Years of education: Not on file   Highest education level: Not on file  Occupational History   Occupation: accountant    Comment: works part time  Tobacco Use   Smoking status: Never Smoker   Smokeless tobacco: Never Used  Scientific laboratory technician Use: Never used  Substance and Sexual Activity   Alcohol use: Yes    Alcohol/week: 2.0 standard drinks    Types: 2 Glasses of wine per week    Comment: 1-2 glasses of wine 4-5 days per week   Drug use: Never   Sexual activity: Not Currently    Comment: widowed in 2018 after 20 years of marriage.  Other Topics Concern   Not on file  Social History Narrative   One living child. Lost his son at 27 to kidney disease. Daughter lives in [place].   Social Determinants of Health   Financial Resource Strain: Low Risk    Difficulty of Paying Living Expenses: Not hard at all  Food Insecurity: No Food Insecurity   Worried About Charity fundraiser in the Last Year: Never true   [place] in the Last Year: Never true  Transportation Needs: No Transportation Needs   Lack of Transportation (Medical): No   Lack of Transportation (Non-Medical): No  Physical Activity: Insufficiently Active   Days of Exercise per Week: 3 days  Minutes of Exercise per Session: 20 min  Stress:    Feeling of Stress : Not on file  Social Connections: Socially Isolated   Frequency of Communication with Friends and Family: More than three times a week   Frequency of Social Gatherings with Friends and Family: Never   Attends Religious Services: Never   Marine scientist or Organizations: No   Attends Archivist Meetings: Never   Marital Status: Widowed  Human resources officer Violence:    Fear of Current or Ex-Partner: Not on file   Emotionally Abused: Not on file   Physically Abused: Not on file   Sexually  Abused: Not on file    Past Surgical History:  Procedure Laterality Date   EYE SURGERY     Age 57   HERNIA REPAIR     3 surgeries   PROSTATE BIOPSY      Family History  Problem Relation Age of Onset   Heart disease Mother    Lymphoma Father    Cancer Father    Lung cancer Maternal Uncle    Lung cancer Maternal Uncle    Breast cancer Neg Hx    Colon cancer Neg Hx    Pancreatic cancer Neg Hx    Prostate cancer Neg Hx     No Known Allergies  Current Outpatient Medications on File Prior to Visit  Medication Sig Dispense Refill   amLODipine (NORVASC) 5 MG tablet Take 1 tablet (5 mg total) by mouth daily. 90 tablet 3   lisinopril (ZESTRIL) 10 MG tablet Take 2 tablets (20 mg total) by mouth daily. 60 tablet 1   Multiple Vitamins-Minerals (MENS MULTIVITAMIN PLUS) TABS Take by mouth daily.     PROAIR HFA 108 (90 Base) MCG/ACT inhaler TAKE 2 PUFFS BY MOUTH EVERY 6 HOURS AS NEEDED FOR WHEEZE OR SHORTNESS OF BREATH 8.5 Inhaler 0   simvastatin (ZOCOR) 20 MG tablet TAKE 1 TABLET (20 MG TOTAL) BY MOUTH AT BEDTIME. 90 tablet 1   No current facility-administered medications on file prior to visit.    BP 132/63    Pulse 74    Temp 98.2 F (36.8 C) (Oral)    Resp 18    Ht 5\' 7"  (1.702 m)    Wt 179 lb 9.6 oz (81.5 kg)    SpO2 96%    BMI 28.13 kg/m       Objective:   Physical Exam   General Mental Status- Alert. General Appearance- Not in acute distress.   Skin General: Color- Normal Color. Moisture- Normal Moisture.  Neck Carotid Arteries- Normal color. Moisture- Normal Moisture. No carotid bruits. No JVD.  Chest and Lung Exam Auscultation: Breath Sounds:-Normal.  Cardiovascular Auscultation:Rythm- Regular. Murmurs & Other Heart Sounds:Auscultation of the heart reveals- No Murmurs.  Abdomen Inspection:-Inspeection Normal. Palpation/Percussion:Note:No mass. Palpation and Percussion of the abdomen reveal- Non Tender, Non Distended + BS, no rebound or  guarding.    Neurologic Cranial Nerve exam:- CN III-XII intact(No nystagmus), symmetric smile. Strength:- 5/5 equal and symmetric strength both upper and lower extremities.     Assessment & Plan:  Your bp is well controlled. Continue amlodipine and lisinopril.  For high cholesterol will recheck lipid panel and cmp. Refill simvastatin after level review.  For elevated sugar recheck your a1c.  For fatigue doing listed labs.   For bph refilling your flomax.  For prostate CA follow thru with oncologist.  Follow up 3 month or as needed  [employer], PA-C

## 2020-09-07 ENCOUNTER — Telehealth: Payer: Self-pay | Admitting: Medical

## 2020-09-07 LAB — COMPREHENSIVE METABOLIC PANEL
AG Ratio: 1.4 (calc) (ref 1.0–2.5)
ALT: 17 U/L (ref 9–46)
AST: 20 U/L (ref 10–35)
Albumin: 4.1 g/dL (ref 3.6–5.1)
Alkaline phosphatase (APISO): 44 U/L (ref 35–144)
BUN/Creatinine Ratio: 21 (calc) (ref 6–22)
BUN: 25 mg/dL (ref 7–25)
CO2: 28 mmol/L (ref 20–32)
Calcium: 9.5 mg/dL (ref 8.6–10.3)
Chloride: 96 mmol/L — ABNORMAL LOW (ref 98–110)
Creat: 1.21 mg/dL — ABNORMAL HIGH (ref 0.70–1.11)
Globulin: 2.9 g/dL (calc) (ref 1.9–3.7)
Glucose, Bld: 99 mg/dL (ref 65–99)
Potassium: 4.9 mmol/L (ref 3.5–5.3)
Sodium: 132 mmol/L — ABNORMAL LOW (ref 135–146)
Total Bilirubin: 1 mg/dL (ref 0.2–1.2)
Total Protein: 7 g/dL (ref 6.1–8.1)

## 2020-09-07 LAB — CBC WITH DIFFERENTIAL/PLATELET
Absolute Monocytes: 973 cells/uL — ABNORMAL HIGH (ref 200–950)
Basophils Absolute: 62 cells/uL (ref 0–200)
Basophils Relative: 0.9 %
Eosinophils Absolute: 400 cells/uL (ref 15–500)
Eosinophils Relative: 5.8 %
HCT: 41.1 % (ref 38.5–50.0)
Hemoglobin: 13.9 g/dL (ref 13.2–17.1)
Lymphs Abs: 662 cells/uL — ABNORMAL LOW (ref 850–3900)
MCH: 31.7 pg (ref 27.0–33.0)
MCHC: 33.8 g/dL (ref 32.0–36.0)
MCV: 93.8 fL (ref 80.0–100.0)
MPV: 10.7 fL (ref 7.5–12.5)
Monocytes Relative: 14.1 %
Neutro Abs: 4802 cells/uL (ref 1500–7800)
Neutrophils Relative %: 69.6 %
Platelets: 234 10*3/uL (ref 140–400)
RBC: 4.38 10*6/uL (ref 4.20–5.80)
RDW: 13.6 % (ref 11.0–15.0)
Total Lymphocyte: 9.6 %
WBC: 6.9 10*3/uL (ref 3.8–10.8)

## 2020-09-07 LAB — HEMOGLOBIN A1C
Hgb A1c MFr Bld: 5.5 % of total Hgb (ref <5.7)
Mean Plasma Glucose: 111 (calc)
eAG (mmol/L): 6.2 (calc)

## 2020-09-07 LAB — LIPID PANEL
Cholesterol: 167 mg/dL (ref <200)
HDL: 66 mg/dL (ref >=40)
LDL Cholesterol (Calc): 85 mg/dL (calc)
Non-HDL Cholesterol (Calc): 101 mg/dL (calc) (ref <130)
Total CHOL/HDL Ratio: 2.5 (calc) (ref <5.0)
Triglycerides: 71 mg/dL (ref <150)

## 2020-09-07 LAB — TSH: TSH: 2.47 mIU/L (ref 0.40–4.50)

## 2020-09-07 LAB — T4, FREE: Free T4: 1.2 ng/dL (ref 0.8–1.8)

## 2020-09-07 LAB — VITAMIN B12: Vitamin B-12: 416 pg/mL (ref 200–1100)

## 2020-09-07 LAB — VITAMIN D 25 HYDROXY (VIT D DEFICIENCY, FRACTURES): Vit D, 25-Hydroxy: 34 ng/mL (ref 30–100)

## 2020-09-07 MED ORDER — SIMVASTATIN 20 MG PO TABS: ORAL_TABLET | ORAL | 1 refills | Status: DC

## 2020-09-07 NOTE — Telephone Encounter (Signed)
Rx simvastatin sent to pt pharmacy. 

## 2020-09-08 ENCOUNTER — Telehealth: Payer: Self-pay | Admitting: *Deleted

## 2020-09-08 NOTE — Telephone Encounter (Signed)
CALLED PATIENT TO ASK ABOUT ALTERING FU ON 09-13-20 TO 9AM , DUE TO CHANGE IN ASHLYN'S SCHEDULE, PATIENT AGREED FOR HER TO CALL @ 9 AM

## 2020-09-09 LAB — VITAMIN B1: Vitamin B1 (Thiamine): 18 nmol/L (ref 8–30)

## 2020-09-13 ENCOUNTER — Ambulatory Visit
Admission: RE | Admit: 2020-09-13 | Discharge: 2020-09-13 | Disposition: A | Discharge status: Home / Self Care | Payer: Medicare HMO | Source: Ambulatory Visit | Attending: Urology | Admitting: Urology

## 2020-09-13 ENCOUNTER — Other Ambulatory Visit: Payer: Self-pay

## 2020-09-13 DIAGNOSIS — C61 Malignant neoplasm of prostate: Secondary | ICD-10-CM

## 2020-09-13 NOTE — Progress Notes (Signed)
  Radiation Oncology         (336) [phone] ________________________________  Name: Donald Porter MRN: [id number]  Date: 08/08/2020  DOB: 25-Apr-1941  End of Treatment Note  Diagnosis:   79 y.o. gentleman with Stage T1c adenocarcinoma of the prostate with Gleason score of 3+4, and PSA of 16.8.     Indication for treatment:  Curative, Definitive Radiotherapy       Radiation treatment dates:   06/29/20 - 08/08/20  Site/dose:   The prostate was treated to 70 Gy in 28 fractions of 2.5 Gy  Beams/energy:   The patient was treated with IMRT using volumetric arc therapy delivering 6 MV X-rays to clockwise and counterclockwise circumferential arcs with a 90 degree collimator offset to avoid dose scalloping.  Image guidance was performed with daily cone beam CT prior to each fraction to align to gold markers in the prostate and assure proper bladder and rectal fill volumes.  Immobilization was achieved with BodyFix custom mold.  Narrative: The patient tolerated radiation treatment relatively well with moderate urinary irritation and modest fatigue.  He reported nocturia 4-5 times per night, mild dysuria, intermittency, hesitancy, and urgency.  He specifically denied gross hematuria, straining to void or incontinence.  He did experience occasional diarrhea which was managed with Imodium as needed.  Plan: The patient has completed radiation treatment. He will return to radiation oncology clinic for routine followup in one month. I advised him to call or return sooner if he has any questions or concerns related to his recovery or treatment. ________________________________  Sheral Apley. Tammi Klippel, M.D.

## 2020-09-14 NOTE — Progress Notes (Signed)
Radiation Oncology         (336) [phone] ________________________________  Name: Donald Porter MRN: [id number]  Date: 09/13/2020  DOB: [birth year]  Post Treatment Note  CC: [name], Nadene Rubins, MD  Diagnosis:   79 y.o.gentleman with Stage T1cadenocarcinoma of the prostate with Gleason score of 3+4, and PSA of16.8.   Interval Since Last Radiation:  5 weeks  06/29/20 - 08/08/20:   The prostate was treated to 70 Gy in 28 fractions of 2.5 Gy   Narrative:  I spoke with the patient to conduct his routine scheduled 1 month follow up visit via telephone to spare the patient unnecessary potential exposure in the healthcare setting during the current COVID-19 pandemic.  The patient was notified in advance and gave permission to proceed with this visit format.   He tolerated radiation treatment relatively well with moderate urinary irritation and modest fatigue.  He reported nocturia 4-5 times per night, mild dysuria, intermittency, hesitancy, and urgency.  He specifically denied gross hematuria, straining to void or incontinence.  He did experience occasional diarrhea which was managed with Imodium as needed.                              On review of systems, the patient states that he is doing well in general.  He has noticed gradual improvement in the LUTS but continues with some mild residual bowel sensitivity depending on what he eats.  He specifically denies gross hematuria, straining to void, incomplete bladder emptying or incontinence.  He denies abdominal pain, nausea, vomiting, diarrhea or constipation.  He reports that his fatigue is gradually improving and overall, he is quite pleased with his progress to date.  ALLERGIES:  has No Known Allergies.  Meds: Current Outpatient Medications  Medication Sig Dispense Refill  . amLODipine (NORVASC) 5 MG tablet Take 1 tablet (5 mg total) by mouth daily. 90 tablet 3  . lisinopril (ZESTRIL) 10 MG tablet Take 2 tablets (20 mg  total) by mouth daily. 60 tablet 1  . Multiple Vitamins-Minerals (MENS MULTIVITAMIN PLUS) TABS Take by mouth daily.    Marland Kitchen PROAIR HFA 108 (90 Base) MCG/ACT inhaler TAKE 2 PUFFS BY MOUTH EVERY 6 HOURS AS NEEDED FOR WHEEZE OR SHORTNESS OF BREATH 8.5 Inhaler 0  . simvastatin (ZOCOR) 20 MG tablet TAKE 1 TABLET (20 MG TOTAL) BY MOUTH AT BEDTIME. 90 tablet 1  . tamsulosin (FLOMAX) 0.4 MG CAPS capsule Take 1 capsule (0.4 mg total) by mouth 2 (two) times daily. 180 capsule 1   No current facility-administered medications for this encounter.    Physical Findings:  vitals were not taken for this visit.   /Unable to assess due to telephone follow-up visit format.  Lab Findings: Lab Results  Component Value Date   WBC 6.9 09/06/2020   HGB 13.9 09/06/2020   HCT 41.1 09/06/2020   MCV 93.8 09/06/2020   PLT 234 09/06/2020     Radiographic Findings: No results found.  Impression/Plan: 1. 79 y.o.gentleman with Stage T1cadenocarcinoma of the prostate with Gleason score of 3+4, and PSA of16.8.  He will continue to follow up with urology for ongoing PSA determinations and has an appointment scheduled with Dr. Gloriann Loan in November 2021. He understands what to expect with regards to PSA monitoring going forward. I will look forward to following his response to treatment via correspondence with urology, and would be happy to continue to participate in his  care if clinically indicated. I talked to the patient about what to expect in the future, including his risk for erectile dysfunction and rectal bleeding. I encouraged him to call or return to the office if he has any questions regarding his previous radiation or possible radiation side effects. He was comfortable with this plan and will follow up as needed.    Nicholos Johns, PA-C

## 2020-09-20 ENCOUNTER — Ambulatory Visit: Payer: Medicare HMO | Admitting: Pharmacist

## 2020-09-20 DIAGNOSIS — E785 Hyperlipidemia, unspecified: Secondary | ICD-10-CM

## 2020-09-20 DIAGNOSIS — R739 Hyperglycemia, unspecified: Secondary | ICD-10-CM

## 2020-09-20 DIAGNOSIS — I10 Essential (primary) hypertension: Secondary | ICD-10-CM

## 2020-09-20 NOTE — Patient Instructions (Signed)
Visit Information  Goals Addressed            This Visit's Progress   . Chronic Care Management Pharmacy Care Plan       CARE PLAN ENTRY (see longitudinal plan of care for additional care plan information)  Current Barriers:  . Chronic Disease Management support, education, and care coordination needs related to Asthma, HTN, Pre-DM, HLD, Allergies, BPH   Hypertension BP Readings from Last 3 Encounters:  09/06/20 132/63  03/22/20 117/73  09/20/19 136/64   . Pharmacist Clinical Goal(s): o Over the next 180 days, patient will work with PharmD and providers to maintain BP goal <140/90 . Current regimen:   Amlodipine 5mg  daily  Lisinopril 10mg  #2 daily . Patient self care activities - Over the next 180 days, patient will: o Maintain hypertension medication regimen.   Hyperlipidemia Lab Results  Component Value Date/Time   LDLCALC 85 09/06/2020 10:00 AM   . Pharmacist Clinical Goal(s): o Over the next 180 days, patient will work with PharmD and providers to maintain LDL goal < 100 . Current regimen:  o Simvastatin 20mg  daily HS . Patient self care activities - Over the next 180 days, patient will: o Maintain cholesterol medication regimen.   Pre-Diabetes Lab Results  Component Value Date/Time   HGBA1C 5.5 09/06/2020 10:00 AM   HGBA1C 5.7 03/22/2020 10:27 AM   . Pharmacist Clinical Goal(s): o Over the next 180 days, patient will work with PharmD and providers to maintain A1c goal <6.5% . Current regimen:  o Diet and exercise management   . Interventions: o Discussed a1c goals and congratulated patient on a1c being below pre-diabetes range . Patient self care activities - Over the next 180 days, patient will: o Maintain a1c <6.5%  Medication management . Pharmacist Clinical Goal(s): o Over the next 180 days, patient will work with PharmD and providers to maintain optimal medication adherence . Current pharmacy: [facility] . Interventions o Comprehensive medication  review performed. o Continue current medication management strategy . Patient self care activities - Over the next 180 days, patient will: o Focus on medication adherence by filling and taking medications appropriately  o Take medications as prescribed o Report any questions or concerns to PharmD and/or provider(s)  Please see past updates related to this goal by clicking on the "Past Updates" button in the selected goal        The patient verbalized understanding of instructions provided today and agreed to receive a mailed copy of patient instruction and/or educational materials.  Telephone follow up appointment with pharmacy team member scheduled for: 03/20/2021  De Blanch, PharmD Clinical Pharmacist [facility] at [facility] [phone]

## 2020-09-20 NOTE — Chronic Care Management (AMB) (Signed)
Chronic Care Management Pharmacy  Name: Donald Porter  MRN: [id number] DOB: Feb 09, 1941  Chief Complaint/ HPI  Donald Porter,  79 y.o. , male presents for their Follow-Up CCM visit with the clinical pharmacist via telephone due to COVID-19 Pandemic.  PCP : Mackie Pai, PA-C  Their chronic conditions include:  Asthma, HTN, Pre-DM, HLD, Allergies, BPH  Office Visits: 09/06/20: Visit w/ Mackie Pai, PA-C - Labs ordered. Meds refilled. No med changes noted. Recommendation to hydrate with propel water vs plain water due to low sodium. No need for strict sodium restriction.  Consult Visit: 09/13/20: Rad Onc comment from [employer], PA-C - Pt doing well. 5 weeks out from completion of prostate IMRT. LUTS gradually improving. Follow with Dr. Gloriann Loan November 2021.  Medications: Outpatient Encounter Medications as of 09/20/2020  Medication Sig   amLODipine (NORVASC) 5 MG tablet Take 1 tablet (5 mg total) by mouth daily.   lisinopril (ZESTRIL) 10 MG tablet Take 2 tablets (20 mg total) by mouth daily.   Multiple Vitamins-Minerals (MENS MULTIVITAMIN PLUS) TABS Take by mouth daily.   PROAIR HFA 108 (90 Base) MCG/ACT inhaler TAKE 2 PUFFS BY MOUTH EVERY 6 HOURS AS NEEDED FOR WHEEZE OR SHORTNESS OF BREATH   simvastatin (ZOCOR) 20 MG tablet TAKE 1 TABLET (20 MG TOTAL) BY MOUTH AT BEDTIME.   tamsulosin (FLOMAX) 0.4 MG CAPS capsule Take 1 capsule (0.4 mg total) by mouth 2 (two) times daily.   No facility-administered encounter medications on file as of 09/20/2020.   SDOH Screenings   Alcohol Screen: Low Risk    Last Alcohol Screening Score (AUDIT): 4  Depression (PHQ2-9): Low Risk    PHQ-2 Score: 0  Financial Resource Strain: Low Risk    Difficulty of Paying Living Expenses: Not hard at all  Food Insecurity: No Food Insecurity   Worried About Charity fundraiser in the Last Year: Never true   Ran Out of Food in the Last Year: Never true  Housing: Low Risk    Last Housing Risk  Score: 0  Physical Activity: Insufficiently Active   Days of Exercise per Week: 3 days   Minutes of Exercise per Session: 20 min  Social Connections: Socially Isolated   Frequency of Communication with Friends and Family: More than three times a week   Frequency of Social Gatherings with Friends and Family: Never   Attends Religious Services: Never   Marine scientist or Organizations: No   Attends Archivist Meetings: Never   Marital Status: Widowed  Stress:    Feeling of Stress : Not on file  Tobacco Use: Low Risk    Smoking Tobacco Use: Never Smoker   Smokeless Tobacco Use: Never Used  Transportation Needs: No Transportation Needs   Lack of Transportation (Medical): No   Lack of Transportation (Non-Medical): No     Current Diagnosis/Assessment:  Goals Addressed            This Visit's Progress    Chronic Care Management Pharmacy Care Plan       CARE PLAN ENTRY (see longitudinal plan of care for additional care plan information)  Current Barriers:   Chronic Disease Management support, education, and care coordination needs related to Asthma, HTN, Pre-DM, HLD, Allergies, BPH   Hypertension BP Readings from Last 3 Encounters:  09/06/20 132/63  03/22/20 117/73  09/20/19 136/64    Pharmacist Clinical Goal(s): o Over the next 180 days, patient will work with PharmD and providers to maintain BP goal <  140/90  Current regimen:   Amlodipine 5mg  daily  Lisinopril 10mg  #2 daily  Patient self care activities - Over the next 180 days, patient will: o Maintain hypertension medication regimen.   Hyperlipidemia Lab Results  Component Value Date/Time   LDLCALC 85 09/06/2020 10:00 AM    Pharmacist Clinical Goal(s): o Over the next 180 days, patient will work with PharmD and providers to maintain LDL goal < 100  Current regimen:  o Simvastatin 20mg  daily HS  Patient self care activities - Over the next 180 days, patient will: o Maintain  cholesterol medication regimen.   Pre-Diabetes Lab Results  Component Value Date/Time   HGBA1C 5.5 09/06/2020 10:00 AM   HGBA1C 5.7 03/22/2020 10:27 AM    Pharmacist Clinical Goal(s): o Over the next 180 days, patient will work with PharmD and providers to maintain A1c goal <6.5%  Current regimen:  o Diet and exercise management    Interventions: o Discussed a1c goals and congratulated patient on a1c being below pre-diabetes range  Patient self care activities - Over the next 180 days, patient will: o Maintain a1c <6.5%  Medication management  Pharmacist Clinical Goal(s): o Over the next 180 days, patient will work with PharmD and providers to maintain optimal medication adherence  [facility]  Interventions o Comprehensive medication review performed. o Continue current medication management strategy  Patient self care activities - Over the next 180 days, patient will: o Focus on medication adherence by filling and taking medications appropriately  o Take medications as prescribed o Report any questions or concerns to PharmD and/or provider(s)  Please see past updates related to this goal by clicking on the "Past Updates" button in the selected goal        Pre-Diabetes   Recent Relevant Labs: Lab Results  Component Value Date/Time   HGBA1C 5.5 09/06/2020 10:00 AM   HGBA1C 5.7 03/22/2020 10:27 AM    Patient has failed these meds in past: None noted  Patient is currently controlled on the following medications:   None  Will continue to avoid sweets. Has been reading nutrition labels more since last visit. Considering getting no sugar added ice cream.  We discussed:  Lab results   Update 09/20/20 A1c has improved. Technically below pre-diabetes range. Congratulated patient on this!  Plan -Continue control with diet and exercise   Hypertension   CMP Latest Ref Rng & Units 09/06/2020 03/22/2020 09/20/2019  Glucose 65 - 99 mg/dL 99 95 101(H)  BUN  7 - 25 mg/dL 25 16 18   Creatinine 0.70 - 1.11 mg/dL 1.21(H) 0.99 1.04  Sodium 135 - 146 mmol/L 132(L) 130(L) 129(L)  Potassium 3.5 - 5.3 mmol/L 4.9 4.6 5.0  Chloride 98 - 110 mmol/L 96(L) 96 94(L)  CO2 20 - 32 mmol/L 28 29 30   Calcium 8.6 - 10.3 mg/dL 9.5 9.3 9.5  Total Protein 6.1 - 8.1 g/dL 7.0 7.1 6.8  Total Bilirubin 0.2 - 1.2 mg/dL 1.0 0.9 0.8  Alkaline Phos 39 - 117 U/L - 46 39  AST 10 - 35 U/L 20 22 24   ALT 9 - 46 U/L 17 17 21    Kidney Function Lab Results  Component Value Date/Time   CREATININE 1.21 (H) 09/06/2020 10:00 AM   CREATININE 0.99 03/22/2020 10:27 AM   CREATININE 1.04 09/20/2019 10:12 AM   CREATININE 1.23 (H) 02/05/2017 11:12 AM   GFR 72.65 03/22/2020 10:27 AM   GFRNONAA 56 (L) 02/05/2017 11:12 AM   GFRAA 65 02/05/2017 11:12 AM  K 4.9 09/06/2020 10:00 AM   K 4.6 03/22/2020 10:27 AM   Office blood pressures are  BP Readings from Last 3 Encounters:  09/06/20 132/63  03/22/20 117/73  09/20/19 136/64    Patient has failed these meds in the past: None noted  Patient is currently controlled on the following medications:   Amlodipine 5mg  daily  Lisinopril 10mg  #2 daily   We discussed  Lab results   Update 09/20/20 States he is trying to get back into walking, but struggling with fatigue. Has a cuff at home, but rarely checks his BP.  Plan -Continue current medications   Hyperlipidemia   Lipid Panel     Component Value Date/Time   CHOL 167 09/06/2020 1000   TRIG 71 09/06/2020 1000   HDL 66 09/06/2020 1000   CHOLHDL 2.5 09/06/2020 1000   VLDL 10.0 03/22/2020 1027   LDLCALC 85 09/06/2020 1000    LDL goal <100  The ASCVD Risk score (Tom Green., et al., 2013) failed to calculate for the following reasons:   The 2013 ASCVD risk score is only valid for ages 22 to 71   Patient has failed these meds in past: None noted  Patient is currently controlled on the following medications:   Simvastatin 20mg  daily HS  We discussed:   Lab results    Update 09/20/20 Labs stable.  Plan -Continue current medications   De Blanch, PharmD Clinical Pharmacist [facility] at [facility] [phone]

## 2020-09-21 ENCOUNTER — Other Ambulatory Visit: Payer: Self-pay | Admitting: Medical

## 2020-09-26 ENCOUNTER — Telehealth: Payer: Self-pay | Admitting: Pharmacist

## 2020-09-26 NOTE — Progress Notes (Addendum)
Verified Adherence Gap Information. Per insurance data, patient is not compliant with Ace/Arb medication; Lisinopril 10mg  tab last dispense date 06-26-20 or Statin medication; Simvastatin 20 mg tab last dispensed 05-31-20.  Fanny Skates, [place] Pharmacist Assistant [phone]  Reviewed fill dates per dispense report probable to be more current than insurance data. Still note pattern of non-adherence. Will need to follow patient for this. -[facility] There was/is a gap in fill history for lisinopril and simvastatin.   Fill Dates Per Dispense Report - Filled Inppropriately Lisinopril - 08/30/20 - 30DS, 06/27/27 - 30DS Simvastatin - 09/07/20 - 90DS, 05/31/20 - 90DS  Reviewed by: De Blanch, PharmD Clinical Pharmacist [facility] at [facility] [phone]

## 2020-10-03 ENCOUNTER — Telehealth: Payer: Self-pay | Admitting: Pharmacist

## 2020-10-03 NOTE — Progress Notes (Addendum)
Chronic Care Management Pharmacy Assistant   Name: Donald Porter  MRN: [id number] DOB: 1941-05-24  Reason for Encounter: Disease State  Patient Questions:  1.  Have you seen any other providers since your last visit? No  2.  Any changes in your medicines or health? No  PCP : Mackie Pai, PA-C  Their chronic conditions include:  Asthma, HTN, Pre-DM, HLD, Allergies, BPH  No Office visits, Hospitalizations, or Consults since last CCM visit on 09-20-20.  Allergies:  No Known Allergies  Medications: Outpatient Encounter Medications as of 10/03/2020  Medication Sig   amLODipine (NORVASC) 5 MG tablet Take 1 tablet (5 mg total) by mouth daily.   lisinopril (ZESTRIL) 10 MG tablet TAKE 2 TABLETS BY MOUTH EVERY DAY   Multiple Vitamins-Minerals (MENS MULTIVITAMIN PLUS) TABS Take by mouth daily.   PROAIR HFA 108 (90 Base) MCG/ACT inhaler TAKE 2 PUFFS BY MOUTH EVERY 6 HOURS AS NEEDED FOR WHEEZE OR SHORTNESS OF BREATH   simvastatin (ZOCOR) 20 MG tablet TAKE 1 TABLET (20 MG TOTAL) BY MOUTH AT BEDTIME.   tamsulosin (FLOMAX) 0.4 MG CAPS capsule Take 1 capsule (0.4 mg total) by mouth 2 (two) times daily.   No facility-administered encounter medications on file as of 10/03/2020.    Current Diagnosis: Patient Active Problem List   Diagnosis Date Noted   Malignant neoplasm of prostate ([place]) 05/02/2020   Nuclear sclerotic cataract of left eye 08/26/2017   Hyperlipidemia 04/26/2015   Acute upper respiratory infection 11/22/2014   Allergic rhinitis 11/08/2014   Asthma due to seasonal allergies 11/08/2014   GERD (gastroesophageal reflux disease) 11/08/2014   Essential hypertension 11/08/2014    Goals Addressed   None    Reviewed chart prior to disease state call. Spoke with patient regarding BP  Recent Office Vitals: BP Readings from Last 3 Encounters:  09/06/20 132/63  03/22/20 117/73  09/20/19 136/64   Pulse Readings from Last 3 Encounters:  09/06/20 74  03/22/20 80  09/20/19  77    Wt Readings from Last 3 Encounters:  09/06/20 179 lb 9.6 oz (81.5 kg)  06/09/20 180 lb (81.6 kg)  05/02/20 180 lb (81.6 kg)     Kidney Function Lab Results  Component Value Date/Time   CREATININE 1.21 (H) 09/06/2020 10:00 AM   CREATININE 0.99 03/22/2020 10:27 AM   CREATININE 1.04 09/20/2019 10:12 AM   CREATININE 1.23 (H) 02/05/2017 11:12 AM   GFR 72.65 03/22/2020 10:27 AM   GFRNONAA 56 (L) 02/05/2017 11:12 AM   GFRAA 65 02/05/2017 11:12 AM    BMP Latest Ref Rng & Units 09/06/2020 03/22/2020 09/20/2019  Glucose 65 - 99 mg/dL 99 95 101(H)  BUN 7 - 25 mg/dL 25 16 18   Creatinine 0.70 - 1.11 mg/dL 1.21(H) 0.99 1.04  BUN/Creat Ratio 6 - 22 (calc) 21 - -  Sodium 135 - 146 mmol/L 132(L) 130(L) 129(L)  Potassium 3.5 - 5.3 mmol/L 4.9 4.6 5.0  Chloride 98 - 110 mmol/L 96(L) 96 94(L)  CO2 20 - 32 mmol/L 28 29 30   Calcium 8.6 - 10.3 mg/dL 9.5 9.3 9.5    Current antihypertensive regimen:  Amlodipine 5mg  daily Lisinopril 10mg  #2 daily How often are you checking your Blood Pressure? Never Current home BP readings: none What recent interventions/DTPs have been made by any provider to improve Blood Pressure control since last CPP Visit: None Any recent hospitalizations or ED visits since last visit with CPP? No What diet changes have been made to improve Blood Pressure Control?  Reports he does take out but not fast food.   What exercise is being done to improve your Blood Pressure Control?  Patient states he started back working part time.  Adherence Review:  Lisinopril 10mg  #2 daily Last refill date was 9-1 with 30 ds and 9-27 with a 30 ds Is the patient currently on ACE/ARB medication? Yes Lisinopril 10mg  #2 daily Does the patient have >5 day gap between last estimated fill dates? No      Follow-Up:  Pharmacist Review   [place] Shannon, [facility] [place] Pharmacist Assistant [phone]  Reviewed by: De Blanch, PharmD Clinical  Pharmacist [facility] [phone]   I have collaborated with the care management provider regarding care management and care coordination activities outlined in this encounter and have reviewed this encounter including documentation in the note and care plan. I am certifying that I agree with the content of this note and encounter as supervising provider.  Mackie Pai, PA-C

## 2020-10-17 ENCOUNTER — Other Ambulatory Visit: Payer: Self-pay | Admitting: Medical

## 2020-10-19 ENCOUNTER — Other Ambulatory Visit: Payer: Self-pay | Admitting: Medical

## 2020-11-09 DIAGNOSIS — C61 Malignant neoplasm of prostate: Secondary | ICD-10-CM | POA: Diagnosis not present

## 2020-11-10 ENCOUNTER — Other Ambulatory Visit: Payer: Self-pay | Admitting: Medical

## 2020-11-16 DIAGNOSIS — C61 Malignant neoplasm of prostate: Secondary | ICD-10-CM | POA: Diagnosis not present

## 2020-11-16 DIAGNOSIS — R351 Nocturia: Secondary | ICD-10-CM | POA: Diagnosis not present

## 2020-11-16 DIAGNOSIS — N401 Enlarged prostate with lower urinary tract symptoms: Secondary | ICD-10-CM | POA: Diagnosis not present

## 2020-11-18 DIAGNOSIS — Z20822 Contact with and (suspected) exposure to covid-19: Secondary | ICD-10-CM | POA: Diagnosis not present

## 2020-11-27 ENCOUNTER — Telehealth: Payer: Self-pay | Admitting: Medical

## 2020-11-27 MED ORDER — ALBUTEROL SULFATE HFA 108 (90 BASE) MCG/ACT IN AERS: INHALATION_SPRAY | RESPIRATORY_TRACT | 2 refills | Status: DC

## 2020-11-27 NOTE — Telephone Encounter (Signed)
Medication:  PROAIR HFA 108 (90 Base) MCG/ACT inhaler [[id number]]      Has the patient contacted their pharmacy?  (If no, request that the patient contact the pharmacy for the refill.) (If yes, when and what did the pharmacy advise?)     Preferred Pharmacy (with phone number or street name): [id number] [address] [place] [address]  [address], [place] [address]  Phone:  [phone] Fax:  [phone]      Agent: Please be advised that RX refills may take up to 3 business days. We ask that you follow-up with your pharmacy.

## 2020-11-27 NOTE — Telephone Encounter (Signed)
Rx sent 

## 2020-11-28 DIAGNOSIS — L821 Other seborrheic keratosis: Secondary | ICD-10-CM | POA: Diagnosis not present

## 2020-11-28 DIAGNOSIS — L820 Inflamed seborrheic keratosis: Secondary | ICD-10-CM | POA: Diagnosis not present

## 2020-11-28 DIAGNOSIS — L570 Actinic keratosis: Secondary | ICD-10-CM | POA: Diagnosis not present

## 2020-11-28 DIAGNOSIS — Z85828 Personal history of other malignant neoplasm of skin: Secondary | ICD-10-CM | POA: Diagnosis not present

## 2020-12-04 ENCOUNTER — Other Ambulatory Visit: Payer: Self-pay | Admitting: Medical

## 2020-12-06 ENCOUNTER — Ambulatory Visit (INDEPENDENT_AMBULATORY_CARE_PROVIDER_SITE_OTHER): Payer: Medicare HMO | Admitting: Medical

## 2020-12-06 ENCOUNTER — Other Ambulatory Visit: Payer: Self-pay

## 2020-12-06 VITALS — BP 136/68 | HR 88 | Temp 97.9°F | Resp 16 | Ht 67.0 in | Wt 186.0 lb

## 2020-12-06 DIAGNOSIS — R739 Hyperglycemia, unspecified: Secondary | ICD-10-CM | POA: Diagnosis not present

## 2020-12-06 DIAGNOSIS — Z8546 Personal history of malignant neoplasm of prostate: Secondary | ICD-10-CM | POA: Diagnosis not present

## 2020-12-06 DIAGNOSIS — I10 Essential (primary) hypertension: Secondary | ICD-10-CM

## 2020-12-06 DIAGNOSIS — E785 Hyperlipidemia, unspecified: Secondary | ICD-10-CM

## 2020-12-06 DIAGNOSIS — Z23 Encounter for immunization: Secondary | ICD-10-CM

## 2020-12-06 DIAGNOSIS — R5383 Other fatigue: Secondary | ICD-10-CM

## 2020-12-06 DIAGNOSIS — N400 Enlarged prostate without lower urinary tract symptoms: Secondary | ICD-10-CM

## 2020-12-06 LAB — LIPID PANEL
Cholesterol: 167 mg/dL (ref 0–200)
HDL: 68.9 mg/dL (ref >39.00)
LDL Cholesterol: 84 mg/dL (ref 0–99)
NonHDL: 98.32
Total CHOL/HDL Ratio: 2
Triglycerides: 71 mg/dL (ref 0.0–149.0)
VLDL: 14.2 mg/dL (ref 0.0–40.0)

## 2020-12-06 LAB — HEMOGLOBIN A1C: Hgb A1c MFr Bld: 5.7 % (ref 4.6–6.5)

## 2020-12-06 LAB — COMPREHENSIVE METABOLIC PANEL
ALT: 16 U/L (ref 0–53)
AST: 20 U/L (ref 0–37)
Albumin: 4.2 g/dL (ref 3.5–5.2)
Alkaline Phosphatase: 47 U/L (ref 39–117)
BUN: 19 mg/dL (ref 6–23)
CO2: 33 mEq/L — ABNORMAL HIGH (ref 19–32)
Calcium: 9.5 mg/dL (ref 8.4–10.5)
Chloride: 95 mEq/L — ABNORMAL LOW (ref 96–112)
Creatinine, Ser: 1.09 mg/dL (ref 0.40–1.50)
GFR: 63.8 mL/min (ref >60.00)
Glucose, Bld: 96 mg/dL (ref 70–99)
Potassium: 4.7 mEq/L (ref 3.5–5.1)
Sodium: 133 mEq/L — ABNORMAL LOW (ref 135–145)
Total Bilirubin: 0.7 mg/dL (ref 0.2–1.2)
Total Protein: 7.1 g/dL (ref 6.0–8.3)

## 2020-12-06 MED ORDER — CEPHALEXIN 500 MG PO CAPS
500.0000 mg | ORAL_CAPSULE | Freq: Three times a day (TID) | ORAL | 0 refills | Status: DC
Start: 2020-12-06 — End: 2021-05-09

## 2020-12-06 NOTE — Progress Notes (Signed)
Subjective:    Patient ID: Donald Porter, male    DOB: [birth year], 79 y.o.   MRN: [id number]  HPI  Pt in today for follow up.  He went thru radiation treatments. He went thru 28 treatments all together for prostate.   Pt last psa was 16.80 in October. This is when he decided to go thru radiation tx.   He will repeat psa again in November was now  4.0. Urologist wants him below 1. Will follow up with urologist in 6 months.  Pt states he is less fatigued than on last visit.  Pt rt achilles tendon area recently has felt better. Pt states walking helps.  Pt has hx of vaccine phizer. Pt did get booster also.  Pt is fasting today. Hx of high cholesterol and mid elevated sugars in the past.  Pt has htn. Bp is well controlled.  Pt has hx of bph. Request refill of flomax.  Recent saw derm for cryotherapy on forehead. Area is healing slow. Redness to area. Mild swollen and tender   Review of Systems  Constitutional: Negative for chills, fatigue and fever.  Respiratory: Negative for cough, chest tightness, shortness of breath and wheezing.   Cardiovascular: Negative for chest pain and palpitations.  Gastrointestinal: Negative for abdominal pain.  Genitourinary: Negative for dysuria.  Musculoskeletal: Negative for back pain and neck pain.  Skin: Negative for rash.  Neurological: Negative for dizziness, weakness, light-headedness, numbness and headaches.  Hematological: Negative for adenopathy.  Psychiatric/Behavioral: Negative for behavioral problems, dysphoric mood and suicidal ideas. The patient is not nervous/anxious.      Past Medical History:  Diagnosis Date  . Allergy   . Asthma    1988  . GERD (gastroesophageal reflux disease)   . Hyperlipidemia 04/26/2015  . Hypertension   . Malignant neoplasm of prostate ([place]) 05/02/2020  . Nuclear sclerotic cataract of left eye 08/26/2017  . Prostate cancer [facility])      Social History   Socioeconomic History  . Marital  status: Widowed    Spouse name: Not on file  . Number of children: 2  . Years of education: Not on file  . Highest education level: Not on file  Occupational History  . Occupation: Optometrist    Comment: works part time  Tobacco Use  . Smoking status: Never Smoker  . Smokeless tobacco: Never Used  Vaping Use  . Vaping Use: Never used  Substance and Sexual Activity  . Alcohol use: Yes    Alcohol/week: 2.0 standard drinks    Types: 2 Glasses of wine per week    Comment: 1-2 glasses of wine 4-5 days per week  . Drug use: Never  . Sexual activity: Not Currently    Comment: widowed in 2018 after 75 years of marriage.  Other Topics Concern  . Not on file  Social History Narrative   One living child. Lost his son at 30 to kidney disease. Daughter lives in [place].   Social Determinants of Health   Financial Resource Strain: Low Risk   . Difficulty of Paying Living Expenses: Not hard at all  Food Insecurity: No Food Insecurity  . Worried About Charity fundraiser in the Last Year: Never true  . Ran Out of Food in the Last Year: Never true  Transportation Needs: No Transportation Needs  . Lack of Transportation (Medical): No  . Lack of Transportation (Non-Medical): No  Physical Activity: Insufficiently Active  . Days of Exercise per Week: 3 days  .  Minutes of Exercise per Session: 20 min  Stress:   . Feeling of Stress : Not on file  Social Connections: Socially Isolated  . Frequency of Communication with Friends and Family: More than three times a week  . Frequency of Social Gatherings with Friends and Family: Never  . Attends Religious Services: Never  . Active Member of Clubs or Organizations: No  . Attends Archivist Meetings: Never  . Marital Status: Widowed  Intimate Partner Violence:   . Fear of Current or Ex-Partner: Not on file  . Emotionally Abused: Not on file  . Physically Abused: Not on file  . Sexually Abused: Not on file    Past Surgical History:   Procedure Laterality Date  . EYE SURGERY     Age 39  . HERNIA REPAIR     3 surgeries  . PROSTATE BIOPSY      Family History  Problem Relation Age of Onset  . Heart disease Mother   . Lymphoma Father   . Cancer Father   . Lung cancer Maternal Uncle   . Lung cancer Maternal Uncle   . Breast cancer Neg Hx   . Colon cancer Neg Hx   . Pancreatic cancer Neg Hx   . Prostate cancer Neg Hx     No Known Allergies  Current Outpatient Medications on File Prior to Visit  Medication Sig Dispense Refill  . albuterol (PROAIR HFA) 108 (90 Base) MCG/ACT inhaler TAKE 2 PUFFS BY MOUTH EVERY 6 HOURS AS NEEDED FOR WHEEZE OR SHORTNESS OF BREATH 90 g 2  . amLODipine (NORVASC) 5 MG tablet Take 1 tablet (5 mg total) by mouth daily. 90 tablet 0  . lisinopril (ZESTRIL) 10 MG tablet Take 2 tablets (20 mg total) by mouth daily. 60 tablet 1  . Multiple Vitamins-Minerals (MENS MULTIVITAMIN PLUS) TABS Take by mouth daily.    . simvastatin (ZOCOR) 20 MG tablet TAKE 1 TABLET (20 MG TOTAL) BY MOUTH AT BEDTIME. 90 tablet 1  . tamsulosin (FLOMAX) 0.4 MG CAPS capsule Take 1 capsule (0.4 mg total) by mouth 2 (two) times daily. 180 capsule 1   No current facility-administered medications on file prior to visit.    BP 136/68   Pulse 88   Temp 97.9 F (36.6 C) (Oral)   Resp 16   Ht 5\' 7"  (1.702 m)   Wt 186 lb (84.4 kg)   SpO2 96%   BMI 29.13 kg/m       Objective:   Physical Exam  General Mental Status- Alert. General Appearance- Not in acute distress.   Skin General: Color- Normal Color. Moisture- Normal Moisture.  Neck Carotid Arteries- Normal color. Moisture- Normal Moisture. No carotid bruits. No JVD.  Chest and Lung Exam Auscultation: Breath Sounds:-Normal.  Cardiovascular Auscultation:Rythm- Regular. Murmurs & Other Heart Sounds:Auscultation of the heart reveals- No Murmurs.  Abdomen Inspection:-Inspeection Normal. Palpation/Percussion:Note:No mass. Palpation and Percussion of  the abdomen reveal- Non Tender, Non Distended + BS, no rebound or guarding.   Neurologic Cranial Nerve exam:- CN III-XII intact(No nystagmus), symmetric smile. Strength:- 5/5 equal and symmetric strength both upper and lower extremities.      Assessment & Plan:  Your bp is well controlled. Continue amlodipine and lisinopril.  For high cholesterol will recheck lipid panel and cmp. Refill simvastatin after level review.  For elevated sugar recheck your a1c.   For bph refilling your flomax.  For prostate CA follow thru with oncologist.  Skin infection forehead. Will rx keflex antibiotic.  Follow  up 3 month or as needed

## 2020-12-06 NOTE — Patient Instructions (Signed)
Your bp is well controlled. Continue amlodipine and lisinopril.  For high cholesterol will recheck lipid panel and cmp. Refill simvastatin after level review.  For elevated sugar recheck your a1c.  For bph refilling your flomax.  For prostate CA follow thru with oncologist.  Skin infection forehead. Will rx keflex antibiotic.  Follow up 3 month or as needed

## 2020-12-07 ENCOUNTER — Telehealth: Payer: Self-pay | Admitting: Medical

## 2020-12-07 ENCOUNTER — Telehealth: Payer: Self-pay | Admitting: Pharmacist

## 2020-12-07 MED ORDER — SIMVASTATIN 20 MG PO TABS: ORAL_TABLET | ORAL | 1 refills | Status: DC

## 2020-12-07 NOTE — Telephone Encounter (Signed)
Rx simvastatin sent to pt pharmacy.

## 2020-12-07 NOTE — Progress Notes (Addendum)
    Chronic Care Management Pharmacy Assistant   Name: Donald Porter  MRN: [id number] DOB: 1941-07-03  Reason for Encounter: Disease State  Patient Questions:  1.  Have you seen any other providers since your last visit?Yes  2.  Any changes in your medicines or health? Yes    PCP : Mackie Pai, PA-C   Their chronic conditions include:  Asthma, HTN, Pre-DM, HLD, Allergies, BPH  Office Visits: 12-06-2020 (PCP) Patient reported in the office for a f/u. Has recently completed radiation treatments for prostate cancer. He reports feeling less fatigued than he has in the past. Recently saw the dermatologist for cryotherapy on the forehead. There was redness to the area. Keflex 500 mg three times a day prescribed.  Consults: 11-18-2020 - [facility]id 19 test at [facility] (neg results)  Allergies:  No Known Allergies  Medications: Outpatient Encounter Medications as of 12/07/2020  Medication Sig   albuterol (PROAIR HFA) 108 (90 Base) MCG/ACT inhaler TAKE 2 PUFFS BY MOUTH EVERY 6 HOURS AS NEEDED FOR WHEEZE OR SHORTNESS OF BREATH   amLODipine (NORVASC) 5 MG tablet Take 1 tablet (5 mg total) by mouth daily.   cephALEXin (KEFLEX) 500 MG capsule Take 1 capsule (500 mg total) by mouth 3 (three) times daily.   lisinopril (ZESTRIL) 10 MG tablet Take 2 tablets (20 mg total) by mouth daily.   Multiple Vitamins-Minerals (MENS MULTIVITAMIN PLUS) TABS Take by mouth daily.   simvastatin (ZOCOR) 20 MG tablet TAKE 1 TABLET (20 MG TOTAL) BY MOUTH AT BEDTIME.   tamsulosin (FLOMAX) 0.4 MG CAPS capsule Take 1 capsule (0.4 mg total) by mouth 2 (two) times daily.   No facility-administered encounter medications on file as of 12/07/2020.    Current Diagnosis: Patient Active Problem List   Diagnosis Date Noted   Malignant neoplasm of prostate ([place]) 05/02/2020   Nuclear sclerotic cataract of left eye 08/26/2017   Hyperlipidemia 04/26/2015   Acute upper respiratory infection 11/22/2014   Allergic  rhinitis 11/08/2014   Asthma due to seasonal allergies 11/08/2014   GERD (gastroesophageal reflux disease) 11/08/2014   Essential hypertension 11/08/2014    Goals Addressed   None    Made outbound call to patient for a General Adherence touch point. Patient reports feeling well without complaints. Stated he had visit with his PCP yesterday that went well. I reviewed the results of his lab work at the patient's request providing him with his current A1c level and notes from Dr. Harvie Heck within the labs stating "good kidney function. Lipid panel is normal. Refills of simvastatin sent." Patient reported gaining five pounds lately that he contributed to Thanksgiving. He also stated he [facility] have any [facility] and was adherent to them all.  Follow-Up:  Pharmacist Review   Fanny Skates, [place] Pharmacist Assistant [phone]  3 minutes spent in review, coordination, and documentation.   Reviewed by: De Blanch, PharmD, BCACP Clinical Pharmacist [facility] [phone]

## 2020-12-28 ENCOUNTER — Other Ambulatory Visit: Payer: Self-pay | Admitting: Medical

## 2021-01-10 ENCOUNTER — Other Ambulatory Visit: Payer: Self-pay | Admitting: Medical

## 2021-01-29 ENCOUNTER — Other Ambulatory Visit: Payer: Self-pay | Admitting: Medical

## 2021-02-11 IMAGING — MR MR PROSTATE WO/W CM
56 series · 56 of 56 positions shown · IV contrast (17 ml multihance)
Comparison: None.

CLINICAL DATA: History of prostate cancer. Elevated PSA equal 16.8.
negative prostate biopsy 03/26/2017

EXAM:
MR PROSTATE WITHOUT AND WITH CONTRAST
TECHNIQUE: Multiplanar multisequence MRI images were obtained of the pelvis
centered about the prostate. Pre and post contrast images were
obtained.
CONTRAST:  17mL MULTIHANCE GADOBENATE DIMEGLUMINE 529 MG/ML IV SOLN
Creatinine was obtained on site at [HOSPITAL] at [HOSPITAL].
Results: Creatinine 0.9 mg/dL.

[Series 3: T1 · axial · 8.0mm · 1.06mm/px · 1 of 28 slices shown (1 of 2)]
[im 1/28]
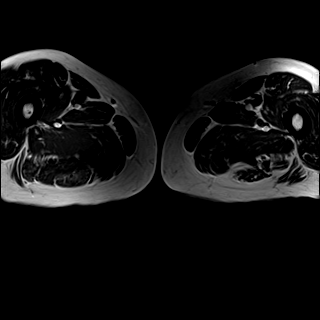

[Series 4: bSSFP fat-sat · axial · 8.0mm · 0.74mm/px · 1 of 28 slices shown]
[im 1/28]
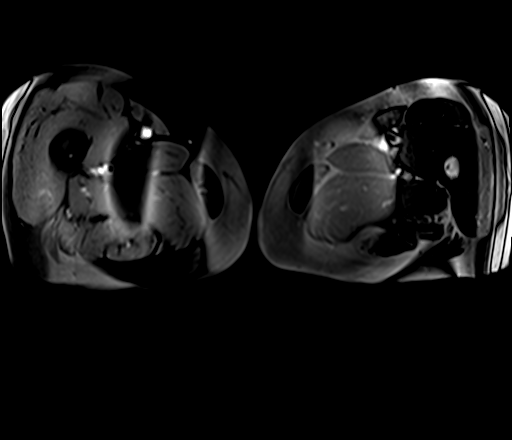

[Series 5: T2 · sagittal · 3.5mm · 0.56mm/px · 1 of 39 slices shown (1 of 4)]
[im 1/39]
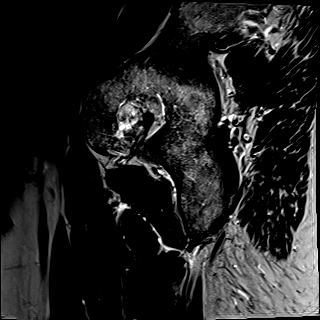

[Series 6: T2 · coronal · 3.5mm · 0.56mm/px · 1 of 24 slices shown (2 of 4)]
[im 1/24]
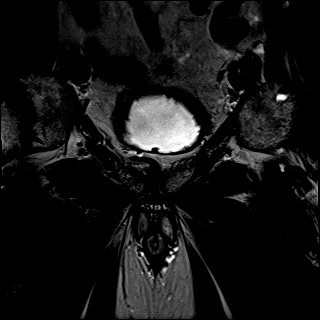

[Series 7: DWI · axial · 3.5mm · 1.56mm/px · 1 of 63 slices shown (1 of 2)]
[im 1/63]
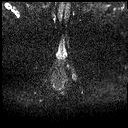

[Series 8: DWI · axial · 3.5mm · 1.56mm/px · 1 of 21 slices shown (2 of 2)]
[im 1/21]
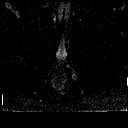

[Series 9: T1 · axial · 3.0mm · 0.31mm/px · 1 of 24 slices shown (2 of 2)]
[im 1/24]
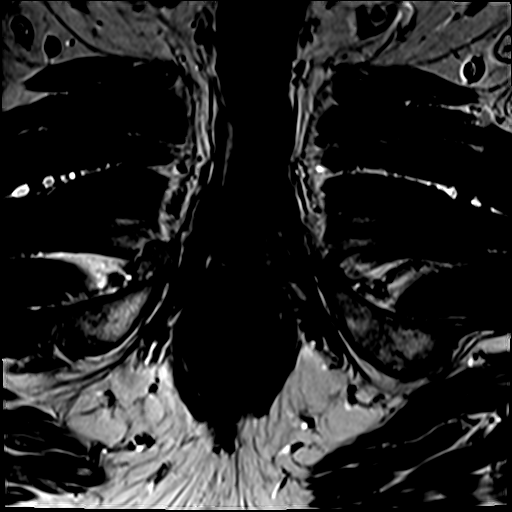

[Series 10: T2 · axial · 3.5mm · 0.56mm/px · 1 of 23 slices shown (3 of 4)]
[im 1/23]
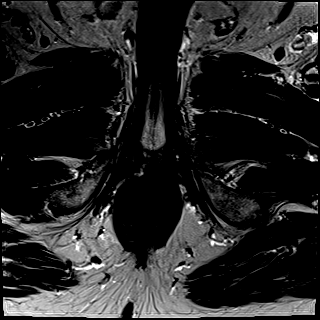

[Series 11: T2 · axial · 1.0mm · 1.04mm/px · 1 of 80 slices shown (4 of 4)]
[im 1/80]
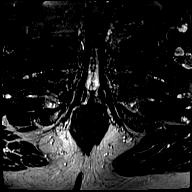

[Series 12: pre t1_twist_tra_dyn_ttc=5.3s · axial · non-contrast · 3.5mm · 0.83mm/px · 1 of 20 slices shown]
[im 1/20]
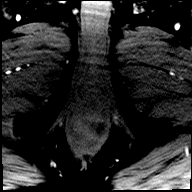

[Series 13: post t1_twist_tra_dyn-copy center · axial · 3.5mm · 0.83mm/px · 1 of 20 slices shown (1 of 24)]
[im 1/20]
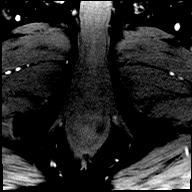

[Series 14: post t1_twist_tra_dyn-copy center · axial · 3.5mm · 0.83mm/px · 1 of 20 slices shown (2 of 24)]
[im 1/20]
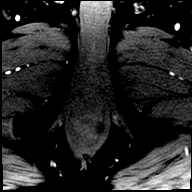

[Series 15: post t1_twist_tra_dyn-copy cent_sub_ttc=(id) · axial · 3.5mm · 0.83mm/px · 1 of 20 slices shown (1 of 22)]
[im 1/20]
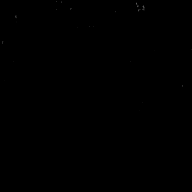

[Series 16: post t1_twist_tra_dyn-copy center · axial · 3.5mm · 0.83mm/px · 1 of 20 slices shown (3 of 24)]
[im 1/20]
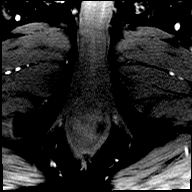

[Series 17: post t1_twist_tra_dyn-copy cent_sub_ttc=(id) · axial · 3.5mm · 0.83mm/px · 1 of 20 slices shown (2 of 22)]
[im 1/20]
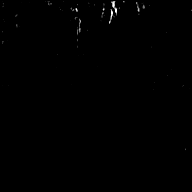

[Series 18: post t1_twist_tra_dyn-copy center · axial · 3.5mm · 0.83mm/px · 1 of 20 slices shown (4 of 24)]
[im 1/20]
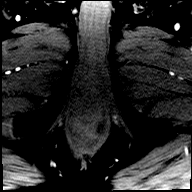

[Series 19: post t1_twist_tra_dyn-copy cent_sub_ttc=(id) · axial · 3.5mm · 0.83mm/px · 1 of 20 slices shown (3 of 22)]
[im 1/20]
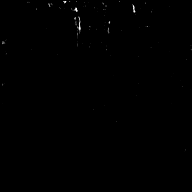

[Series 20: post t1_twist_tra_dyn-copy center · axial · 3.5mm · 0.83mm/px · 1 of 20 slices shown (5 of 24)]
[im 1/20]
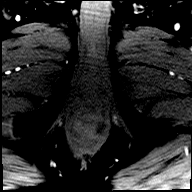

[Series 21: post t1_twist_tra_dyn-copy cent_sub_ttc=(id) · axial · 3.5mm · 0.83mm/px · 1 of 20 slices shown (4 of 22)]
[im 1/20]
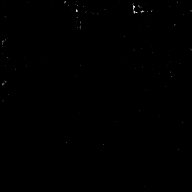

[Series 22: post t1_twist_tra_dyn-copy center · axial · 3.5mm · 0.83mm/px · 1 of 20 slices shown (6 of 24)]
[im 1/20]
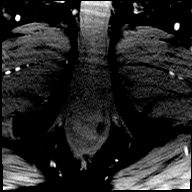

[Series 23: post t1_twist_tra_dyn-copy cent_sub_ttc=(id) · axial · 3.5mm · 0.83mm/px · 1 of 20 slices shown (5 of 22)]
[im 1/20]
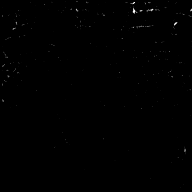

[Series 24: post t1_twist_tra_dyn-copy center · axial · 3.5mm · 0.83mm/px · 1 of 20 slices shown (7 of 24)]
[im 1/20]
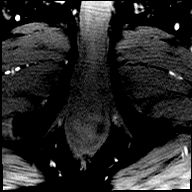

[Series 25: post t1_twist_tra_dyn-copy cent_sub_ttc=(id) · axial · 3.5mm · 0.83mm/px · 1 of 20 slices shown (6 of 22)]
[im 1/20]
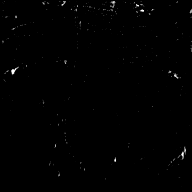

[Series 26: post t1_twist_tra_dyn-copy center · axial · 3.5mm · 0.83mm/px · 1 of 20 slices shown (8 of 24)]
[im 1/20]
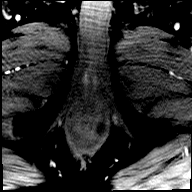

[Series 27: post t1_twist_tra_dyn-copy cent_sub_ttc=(id) · axial · 3.5mm · 0.83mm/px · 1 of 20 slices shown (7 of 22)]
[im 1/20]
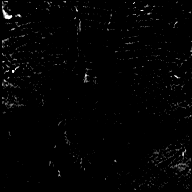

[Series 28: post t1_twist_tra_dyn-copy center · axial · 3.5mm · 0.83mm/px · 1 of 20 slices shown (9 of 24)]
[im 1/20]
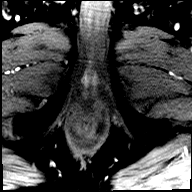

[Series 29: post t1_twist_tra_dyn-copy cent_sub_ttc=(id) · axial · 3.5mm · 0.83mm/px · 1 of 20 slices shown (8 of 22)]
[im 1/20]
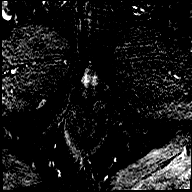

[Series 30: post t1_twist_tra_dyn-copy center · axial · 3.5mm · 0.83mm/px · 1 of 20 slices shown (10 of 24)]
[im 1/20]
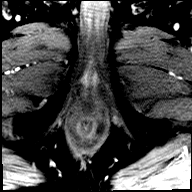

[Series 31: post t1_twist_tra_dyn-copy cent_sub_ttc=(id) · axial · 3.5mm · 0.83mm/px · 1 of 20 slices shown (9 of 22)]
[im 1/20]
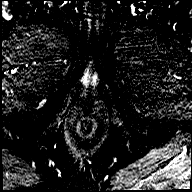

[Series 32: post t1_twist_tra_dyn-copy center · axial · 3.5mm · 0.83mm/px · 1 of 20 slices shown (11 of 24)]
[im 1/20]
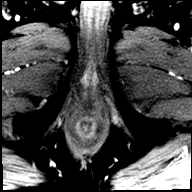

[Series 33: post t1_twist_tra_dyn-copy cent_sub_ttc=(id) · axial · 3.5mm · 0.83mm/px · 1 of 20 slices shown (10 of 22)]
[im 1/20]
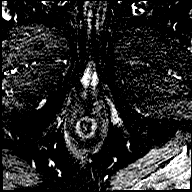

[Series 34: post t1_twist_tra_dyn-copy center · axial · 3.5mm · 0.83mm/px · 1 of 20 slices shown (12 of 24)]
[im 1/20]
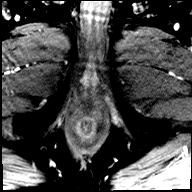

[Series 35: post t1_twist_tra_dyn-copy cent_sub_ttc=(id) · axial · 3.5mm · 0.83mm/px · 1 of 20 slices shown (11 of 22)]
[im 1/20]
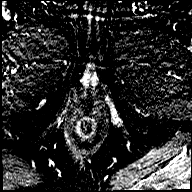

[Series 36: post t1_twist_tra_dyn-copy center · axial · 3.5mm · 0.83mm/px · 1 of 20 slices shown (13 of 24)]
[im 1/20]
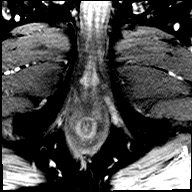

[Series 37: post t1_twist_tra_dyn-copy cent_sub_ttc=(id) · axial · 3.5mm · 0.83mm/px · 1 of 20 slices shown (12 of 22)]
[im 1/20]
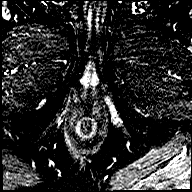

[Series 38: post t1_twist_tra_dyn-copy center · axial · 3.5mm · 0.83mm/px · 1 of 20 slices shown (14 of 24)]
[im 1/20]
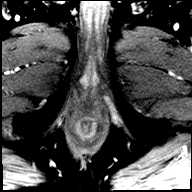

[Series 39: post t1_twist_tra_dyn-copy cent_sub_ttc=(id) · axial · 3.5mm · 0.83mm/px · 1 of 20 slices shown (13 of 22)]
[im 1/20]
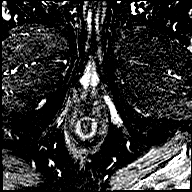

[Series 40: post t1_twist_tra_dyn-copy center · axial · 3.5mm · 0.83mm/px · 1 of 20 slices shown (15 of 24)]
[im 1/20]
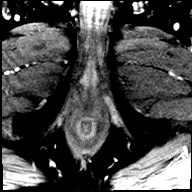

[Series 41: post t1_twist_tra_dyn-copy cent_sub_ttc=(id) · axial · 3.5mm · 0.83mm/px · 1 of 20 slices shown (14 of 22)]
[im 1/20]
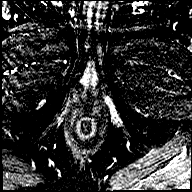

[Series 42: post t1_twist_tra_dyn-copy center · axial · 3.5mm · 0.83mm/px · 1 of 20 slices shown (16 of 24)]
[im 1/20]
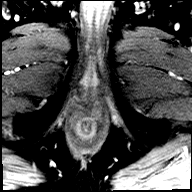

[Series 43: post t1_twist_tra_dyn-copy cent_sub_ttc=(id) · axial · 3.5mm · 0.83mm/px · 1 of 20 slices shown (15 of 22)]
[im 1/20]
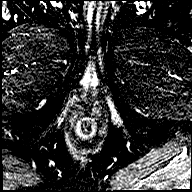

[Series 44: post t1_twist_tra_dyn-copy center · axial · 3.5mm · 0.83mm/px · 1 of 20 slices shown (17 of 24)]
[im 1/20]
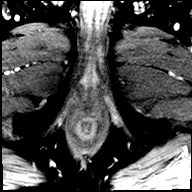

[Series 45: post t1_twist_tra_dyn-copy cent_sub_ttc=(id) · axial · 3.5mm · 0.83mm/px · 1 of 20 slices shown (16 of 22)]
[im 1/20]
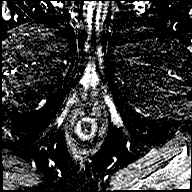

[Series 46: post t1_twist_tra_dyn-copy center · axial · 3.5mm · 0.83mm/px · 1 of 20 slices shown (18 of 24)]
[im 1/20]
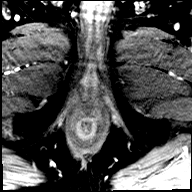

[Series 47: post t1_twist_tra_dyn-copy cent_sub_ttc=(id) · axial · 3.5mm · 0.83mm/px · 1 of 20 slices shown (17 of 22)]
[im 1/20]
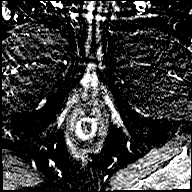

[Series 48: post t1_twist_tra_dyn-copy center · axial · 3.5mm · 0.83mm/px · 1 of 20 slices shown (19 of 24)]
[im 1/20]
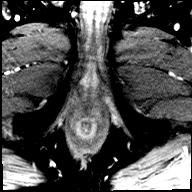

[Series 49: post t1_twist_tra_dyn-copy cent_sub_ttc=(id) · axial · 3.5mm · 0.83mm/px · 1 of 20 slices shown (18 of 22)]
[im 1/20]
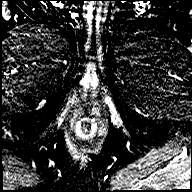

[Series 50: post t1_twist_tra_dyn-copy center · axial · 3.5mm · 0.83mm/px · 1 of 20 slices shown (20 of 24)]
[im 1/20]
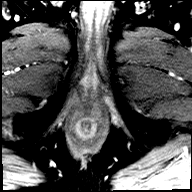

[Series 51: post t1_twist_tra_dyn-copy cent_sub_ttc=(id) · axial · 3.5mm · 0.83mm/px · 1 of 20 slices shown (19 of 22)]
[im 1/20]
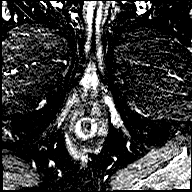

[Series 52: post t1_twist_tra_dyn-copy center · axial · 3.5mm · 0.83mm/px · 1 of 20 slices shown (21 of 24)]
[im 1/20]
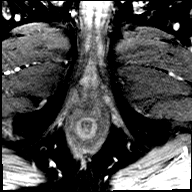

[Series 53: post t1_twist_tra_dyn-copy cent_sub_ttc=(id) · axial · 3.5mm · 0.83mm/px · 1 of 20 slices shown (20 of 22)]
[im 1/20]
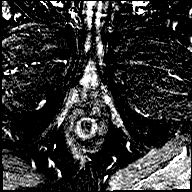

[Series 54: post t1_twist_tra_dyn-copy center · axial · 3.5mm · 0.83mm/px · 1 of 20 slices shown (22 of 24)]
[im 1/20]
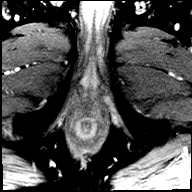

[Series 55: post t1_twist_tra_dyn-copy cent_sub_ttc=(id) · axial · 3.5mm · 0.83mm/px · 1 of 20 slices shown (21 of 22)]
[im 1/20]
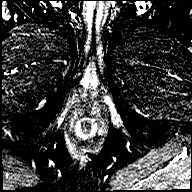

[Series 56: post t1_twist_tra_dyn-copy center · axial · 3.5mm · 0.83mm/px · 1 of 20 slices shown (23 of 24)]
[im 1/20]
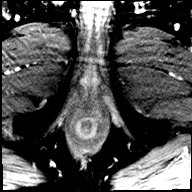

[Series 57: post t1_twist_tra_dyn-copy cent_sub_ttc=(id) · axial · 3.5mm · 0.83mm/px · 1 of 20 slices shown (22 of 22)]
[im 1/20]
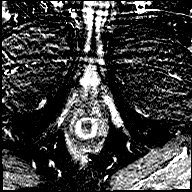

[Series 58: post t1_twist_tra_dyn-copy center · axial · 3.5mm · 0.83mm/px · 1 of 20 slices shown (24 of 24)]
[im 1/20]
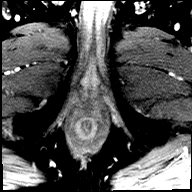

[56 of 56 positions shown; findings below may reference images not displayed]

FINDINGS: Prostate: No evidence restricted diffusion within the peripheral
zone (series [DATE]).

An ovoid lesion in the anterior aspect of the RIGHT mid gland has
low signal intensity T2 weighted imaging measuring 1.3 x 0.7 cm
(image [DATE]). There is no significant restricted diffusion
associated with this lesion. However, the lesion does enhance avidly
(image [DATE]) on the early vascular sequences.

The transitional zone is enlarged by capsulated nodules with typical
benign heterogeneous pattern.

Volume: 4.4 x  4.7 by 5.6 cm (volume = 61 cm^3)

Transcapsular spread:  Absent

Seminal vesicle involvement: Absent

Neurovascular bundle involvement: Absent

Pelvic adenopathy: Absent

Bone metastasis: Absent

Other findings: None
IMPRESSION: 1. Indeterminate lesion within the anterior aspect of the RIGHT mid
gland. No restricted diffusion but early arterial enhancement.
Differential includes prostate adenocarcinoma versus an extruded
nodule from the transitional zone. PI-RADS: 3 (Dynacad 3D post
processing performed)
2. Nodular transitional zone most consistent benign prostate
hypertrophy PI-RADS: 1

## 2021-02-22 ENCOUNTER — Other Ambulatory Visit: Payer: Self-pay | Admitting: Medical

## 2021-02-27 ENCOUNTER — Other Ambulatory Visit: Payer: Self-pay | Admitting: Medical

## 2021-02-28 ENCOUNTER — Telehealth: Payer: Self-pay | Admitting: Medical

## 2021-02-28 NOTE — Telephone Encounter (Signed)
LVM  AW[facility] appt changed- r/s w/ Caroleen Hamman

## 2021-03-06 ENCOUNTER — Ambulatory Visit: Payer: Medicare HMO | Admitting: Medical

## 2021-03-13 ENCOUNTER — Ambulatory Visit: Payer: Medicare HMO | Admitting: Medical

## 2021-03-15 ENCOUNTER — Ambulatory Visit (INDEPENDENT_AMBULATORY_CARE_PROVIDER_SITE_OTHER): Payer: Medicare HMO | Admitting: Medical

## 2021-03-15 ENCOUNTER — Other Ambulatory Visit: Payer: Self-pay

## 2021-03-15 VITALS — BP 129/61 | HR 72 | Temp 98.0°F | Ht 67.0 in | Wt 182.8 lb

## 2021-03-15 DIAGNOSIS — R739 Hyperglycemia, unspecified: Secondary | ICD-10-CM | POA: Diagnosis not present

## 2021-03-15 DIAGNOSIS — E785 Hyperlipidemia, unspecified: Secondary | ICD-10-CM | POA: Diagnosis not present

## 2021-03-15 DIAGNOSIS — C61 Malignant neoplasm of prostate: Secondary | ICD-10-CM | POA: Diagnosis not present

## 2021-03-15 DIAGNOSIS — I10 Essential (primary) hypertension: Secondary | ICD-10-CM | POA: Diagnosis not present

## 2021-03-15 LAB — COMPREHENSIVE METABOLIC PANEL
ALT: 15 U/L (ref 0–53)
AST: 18 U/L (ref 0–37)
Albumin: 3.9 g/dL (ref 3.5–5.2)
Alkaline Phosphatase: 50 U/L (ref 39–117)
BUN: 20 mg/dL (ref 6–23)
CO2: 29 mEq/L (ref 19–32)
Calcium: 9.5 mg/dL (ref 8.4–10.5)
Chloride: 96 mEq/L (ref 96–112)
Creatinine, Ser: 0.97 mg/dL (ref 0.40–1.50)
GFR: 73.24 mL/min (ref >60.00)
Glucose, Bld: 102 mg/dL — ABNORMAL HIGH (ref 70–99)
Potassium: 4.7 mEq/L (ref 3.5–5.1)
Sodium: 133 mEq/L — ABNORMAL LOW (ref 135–145)
Total Bilirubin: 0.7 mg/dL (ref 0.2–1.2)
Total Protein: 7 g/dL (ref 6.0–8.3)

## 2021-03-15 LAB — LIPID PANEL
Cholesterol: 155 mg/dL (ref 0–200)
HDL: 63.8 mg/dL (ref >39.00)
LDL Cholesterol: 80 mg/dL (ref 0–99)
NonHDL: 91.23
Total CHOL/HDL Ratio: 2
Triglycerides: 56 mg/dL (ref 0.0–149.0)
VLDL: 11.2 mg/dL (ref 0.0–40.0)

## 2021-03-15 LAB — HEMOGLOBIN A1C: Hgb A1c MFr Bld: 5.9 % (ref 4.6–6.5)

## 2021-03-15 NOTE — Patient Instructions (Signed)
Your bp is well controlled. Continue amlodipine 5 mg daily and lisinopril 10 mg daily.  For high cholesterol continue simvastatin. Check cmp and lipid panel today.  For elevated sugar get a1c.   For hx of prostate cancer continue to follow up with urologist.   Follow up 3 months or as needed.

## 2021-03-15 NOTE — Progress Notes (Signed)
Subjective:    Patient ID: Donald Porter, male    DOB: 14-Oct-1941, 80 y.o.   MRN: [id number]  HPI  Pt in for follow up.  Hx of htn.  BP controlled. On amlodipine 5 mg daily and lisinopril 10 mg daily.   High cholesterol history. Pt is on simvastatin.   Pt has hx of prostate. Pt followed by urologist.Will see urologist in June. Last psa was 2.0 per pt.  Pt had 3 covid vaccines.  Mild elevated sugar history.   Review of Systems  Constitutional: Negative for chills, fatigue and fever.  HENT: Negative for congestion, ear discharge, postnasal drip, sinus pressure, sinus pain and sneezing.   Respiratory: Negative for cough, chest tightness, shortness of breath and wheezing.   Cardiovascular: Negative for chest pain and palpitations.  Gastrointestinal: Negative for abdominal pain.  Genitourinary: Negative for dysuria, flank pain and frequency.  Musculoskeletal: Negative for back pain.  Skin: Negative for rash.  Neurological: Negative for dizziness, speech difficulty, weakness, numbness and headaches.  Hematological: Negative for adenopathy. Does not bruise/bleed easily.  Psychiatric/Behavioral: Negative for behavioral problems, decreased concentration and dysphoric mood.     Past Medical History:  Diagnosis Date  . Allergy   . Asthma    1988  . GERD (gastroesophageal reflux disease)   . Hyperlipidemia 04/26/2015  . Hypertension   . Malignant neoplasm of prostate ([place]) 05/02/2020  . Nuclear sclerotic cataract of left eye 08/26/2017  . Prostate cancer [facility])      Social History   Socioeconomic History  . Marital status: Widowed    Spouse name: Not on file  . Number of children: 2  . Years of education: Not on file  . Highest education level: Not on file  Occupational History  . Occupation: Optometrist    Comment: works part time  Tobacco Use  . Smoking status: Never Smoker  . Smokeless tobacco: Never Used  Vaping Use  . Vaping Use: Never used  Substance and Sexual  Activity  . Alcohol use: Yes    Alcohol/week: 2.0 standard drinks    Types: 2 Glasses of wine per week    Comment: 1-2 glasses of wine 4-5 days per week  . Drug use: Never  . Sexual activity: Not Currently    Comment: widowed in 2018 after 52 years of marriage.  Other Topics Concern  . Not on file  Social History Narrative   One living child. Lost his son at 84 to kidney disease. Daughter lives in [place].   Social Determinants of Health   Financial Resource Strain: Low Risk   . Difficulty of Paying Living Expenses: Not hard at all  Food Insecurity: No Food Insecurity  . Worried About Charity fundraiser in the Last Year: Never true  . Ran Out of Food in the Last Year: Never true  Transportation Needs: No Transportation Needs  . Lack of Transportation (Medical): No  . Lack of Transportation (Non-Medical): No  Physical Activity: Insufficiently Active  . Days of Exercise per Week: 3 days  . Minutes of Exercise per Session: 20 min  Stress: Not on file  Social Connections: Socially Isolated  . Frequency of Communication with Friends and Family: More than three times a week  . Frequency of Social Gatherings with Friends and Family: Never  . Attends Religious Services: Never  . Active Member of Clubs or Organizations: No  . Attends Archivist Meetings: Never  . Marital Status: Widowed  Intimate Partner Violence: Not on  file    Past Surgical History:  Procedure Laterality Date  . EYE SURGERY     Age 73  . HERNIA REPAIR     3 surgeries  . PROSTATE BIOPSY      Family History  Problem Relation Age of Onset  . Heart disease Mother   . Lymphoma Father   . Cancer Father   . Lung cancer Maternal Uncle   . Lung cancer Maternal Uncle   . Breast cancer Neg Hx   . Colon cancer Neg Hx   . Pancreatic cancer Neg Hx   . Prostate cancer Neg Hx     No Known Allergies  Current Outpatient Medications on File Prior to Visit  Medication Sig Dispense Refill  . albuterol  (PROAIR HFA) 108 (90 Base) MCG/ACT inhaler TAKE 2 PUFFS BY MOUTH EVERY 6 HOURS AS NEEDED FOR WHEEZE OR SHORTNESS OF BREATH 90 g 2  . amLODipine (NORVASC) 5 MG tablet TAKE 1 TABLET BY MOUTH EVERY DAY 90 tablet 0  . lisinopril (ZESTRIL) 10 MG tablet TAKE 2 TABLETS BY MOUTH EVERY DAY 60 tablet 1  . Multiple Vitamins-Minerals (MENS MULTIVITAMIN PLUS) TABS Take by mouth daily.    . simvastatin (ZOCOR) 20 MG tablet TAKE 1 TABLET (20 MG TOTAL) BY MOUTH AT BEDTIME. 90 tablet 1  . tamsulosin (FLOMAX) 0.4 MG CAPS capsule Take 1 capsule (0.4 mg total) by mouth 2 (two) times daily. 180 capsule 3  . cephALEXin (KEFLEX) 500 MG capsule Take 1 capsule (500 mg total) by mouth 3 (three) times daily. (Patient not taking: Reported on 03/15/2021) 21 capsule 0   No current facility-administered medications on file prior to visit.    BP 129/61 (BP Location: Left Arm, Patient Position: Sitting, Cuff Size: Normal)   Pulse 72   Temp 98 F (36.7 C) (Oral)   Ht 5\' 7"  (1.702 m)   Wt 182 lb 12.8 oz (82.9 kg)   SpO2 99%   BMI 28.63 kg/m       Objective:   Physical Exam   General Mental Status- Alert. General Appearance- Not in acute distress.   Skin General: Color- Normal Color. Moisture- Normal Moisture.  Neck Carotid Arteries- Normal color. Moisture- Normal Moisture. No carotid bruits. No JVD.  Chest and Lung Exam Auscultation: Breath Sounds:-Normal.  Cardiovascular Auscultation:Rythm- Regular. Murmurs & Other Heart Sounds:Auscultation of the heart reveals- No Murmurs.  Abdomen Inspection:-Inspeection Normal. Palpation/Percussion:Note:No mass. Palpation and Percussion of the abdomen reveal- Non Tender, Non Distended + BS, no rebound or guarding.   Neurologic Cranial Nerve exam:- CN III-XII intact(No nystagmus), symmetric smile. Strength:- 5/5 equal and symmetric strength both upper and lower extremities.     Assessment & Plan:  Your bp is well controlled. Continue amlodipine 5 mg daily and  lisinopril 10 mg daily.  For high cholesterol continue simvastatin. Check cmp and lipid panel today.  For elevated sugar get a1c.   For hx of prostate cancer continue to follow up with urologist.   Follow up 3 months or as needed.  Mackie Pai, PA-C

## 2021-03-19 ENCOUNTER — Other Ambulatory Visit: Payer: Self-pay | Admitting: Medical

## 2021-03-20 ENCOUNTER — Telehealth: Payer: Medicare HMO

## 2021-03-20 NOTE — Progress Notes (Deleted)
Chronic Care Management Pharmacy Note  03/20/2021 Name:  Donald Porter MRN:  [id number] DOB:  [birth year]  Subjective: Donald Porter is an 80 y.o. year old male who is a primary patient of [name], [name], [place].  The CCM team was consulted for assistance with disease management and care coordination needs.    Engaged with patient by telephone for follow up visit in response to provider referral for pharmacy case management and/or care coordination services.   Consent to Services:  The patient was given information about Chronic Care Management services, agreed to services, and gave verbal consent prior to initiation of services.  Please see initial visit note for detailed documentation.   Patient Care Team: [name], Iris Pert as PCP - General (Physician Assistant) Day, Melvenia Beam, [facility] (Inactive) as Pharmacist (Pharmacist)  Recent office visits: 03/15/2021 - PCP ([place]) - F/U chronic conditions - HTN, hyperlipidemia, hyperglycemia. No medication changes noted. Labs checked - CMP, lipids, A1c.   Recent consult visits: Seen by dermatology 11/28/2020 - Dr Salomon Mast for procedure to treat actinic keratosis.   Hospital visits: None in previous 6 months  Objective:  Lab Results  Component Value Date   CREATININE 0.97 03/15/2021   CREATININE 1.09 12/06/2020   CREATININE 1.21 (H) 09/06/2020    Lab Results  Component Value Date   HGBA1C 5.9 03/15/2021   Last diabetic Eye exam: No results found for: HMDIABEYEEXA  Last diabetic Foot exam: No results found for: HMDIABFOOTEX      Component Value Date/Time   CHOL 155 03/15/2021 0925   TRIG 56.0 03/15/2021 0925   HDL 63.80 03/15/2021 0925   CHOLHDL 2 03/15/2021 0925   VLDL 11.2 03/15/2021 0925   LDLCALC 80 03/15/2021 0925   LDLCALC 85 09/06/2020 1000    Hepatic Function Latest Ref Rng & Units 03/15/2021 12/06/2020 09/06/2020  Total Protein 6.0 - 8.3 g/dL 7.0 7.1 7.0  Albumin 3.5 - 5.2 g/dL 3.9 4.2 -  AST 0 - 37 U/L _0 ALT 0 - 53 U/L _1 Alk Phosphatase 39 - 117 U/L 50 47 -  Total Bilirubin 0.2 - 1.2 mg/dL 0.7 0.7 1.0    Lab Results  Component Value Date/Time   TSH 2.47 09/06/2020 10:00 AM   FREET4 1.2 09/06/2020 10:00 AM    CBC Latest Ref Rng & Units 09/06/2020 10/30/2016 11/09/2015  WBC 3.8 - 10.8 Thousand/uL 6.9 8.5 9.9  Hemoglobin 13.2 - 17.1 g/dL 13.9 14.3 14.8  Hematocrit 38.5 - 50.0 % 41.1 42.2 43.8  Platelets 140 - 400 Thousand/uL 234 249.0 277.0    Lab Results  Component Value Date/Time   VD25OH 34 09/06/2020 10:00 AM    Clinical ASCVD: {YES/NO:21197} The ASCVD Risk score ([place]., et al., 2013) failed to calculate for the following reasons:   The 2013 ASCVD risk score is only valid for ages 42 to 9    Other: (CHADS2VASc if Afib, PHQ9 if depression, MMRC or CAT for COPD, ACT, DEXA)  Social History   Tobacco Use  Smoking Status Never Smoker  Smokeless Tobacco Never Used   BP Readings from Last 3 Encounters:  03/15/21 129/61  12/06/20 136/68  09/06/20 132/63   Pulse Readings from Last 3 Encounters:  03/15/21 72  12/06/20 88  09/06/20 74   Wt Readings from Last 3 Encounters:  03/15/21 182 lb 12.8 oz (82.9 kg)  12/06/20 186 lb (84.4 kg)  09/06/20 179 lb 9.6 oz (81.5 kg)  Assessment: Review of patient past medical history, allergies, medications, health status, including review of consultants reports, laboratory and other test data, was performed as part of comprehensive evaluation and provision of chronic care management services.   SDOH:  (Social Determinants of Health) assessments and interventions performed:    CCM Care Plan  No Known Allergies  Medications Reviewed Today    Reviewed by Legrand Rams, CMA (Certified Medical Assistant) on 03/15/21 at [phone]  Med List Status: <None>  Medication Order Taking? Sig Documenting Provider Last Dose Status Informant  albuterol (PROAIR HFA) 108 (90 Base) MCG/ACT inhaler [id number] Yes TAKE 2 PUFFS BY MOUTH  EVERY 6 HOURS AS NEEDED FOR WHEEZE OR SHORTNESS OF BREATH [name], Iris Pert Taking Active   amLODipine (NORVASC) 5 MG tablet [id number] Yes TAKE 1 TABLET BY MOUTH EVERY DAY [name], [name], PA-C Taking Active   cephALEXin (KEFLEX) 500 MG capsule [id number] No Take 1 capsule (500 mg total) by mouth 3 (three) times daily.  Patient not taking: Reported on 03/15/2021   Mackie Pai, PA-C Not Taking Consider Medication Status and Discontinue (Patient Preference)   lisinopril (ZESTRIL) 10 MG tablet [id number] Yes TAKE 2 TABLETS BY MOUTH EVERY DAY [name], [name], PA-C Taking Active   Multiple Vitamins-Minerals (MENS MULTIVITAMIN PLUS) TABS [id number] Yes Take by mouth daily. [provider] Taking Active   simvastatin (ZOCOR) 20 MG tablet [id number] Yes TAKE 1 TABLET (20 MG TOTAL) BY MOUTH AT BEDTIME. [name], [name], PA-C Taking Active   tamsulosin [facility]) 0.4 MG CAPS capsule [id number] Yes Take 1 capsule (0.4 mg total) by mouth 2 (two) times daily. Mackie Pai, PA-C Taking Active           Patient Active Problem List   Diagnosis Date Noted  . Malignant neoplasm of prostate ([place]) 05/02/2020  . Nuclear sclerotic cataract of left eye 08/26/2017  . Hyperlipidemia 04/26/2015  . Acute upper respiratory infection 11/22/2014  . Allergic rhinitis 11/08/2014  . Asthma due to seasonal allergies 11/08/2014  . GERD (gastroesophageal reflux disease) 11/08/2014  . Essential hypertension 11/08/2014    Immunization History  Administered Date(s) Administered  . Fluad Quad(high Dose 65+) 09/20/2019, 12/06/2020  . Influenza, High Dose Seasonal PF 10/17/2017, 10/07/2018  . Influenza,inj,Quad PF,6+ Mos 11/09/2015  . Influenza-Unspecified 09/18/2016  . PFIZER(Purple Top)SARS-COV-2 Vaccination 01/08/2020, 01/29/2020  . Pneumococcal Conjugate-13 02/05/2017  . Pneumococcal Polysaccharide-23 12/07/2015  . Tdap 07/10/2016  . Zoster 11/09/2015    Conditions to be addressed/monitored: {CCM  ASSESSMENT DISEASE OPTIONS:25047}  There are no care plans that you recently modified to display for this patient.   Medication Assistance: {MEDASSISTANCEINFO:25044}  Patient's preferred pharmacy is:  [id number] [place] [address]Phone: [phone]: [phone] Uses pill box? {Yes or If no, why not?:20788} Pt endorses ***% compliance  Follow Up:  {FOLLOWUP:24991}  Plan: {CM FOLLOW UP PLAN:25073}  SIG***

## 2021-03-29 ENCOUNTER — Telehealth: Payer: Medicare HMO

## 2021-04-04 ENCOUNTER — Other Ambulatory Visit: Payer: Self-pay | Admitting: Medical

## 2021-05-09 ENCOUNTER — Ambulatory Visit (INDEPENDENT_AMBULATORY_CARE_PROVIDER_SITE_OTHER): Payer: Medicare HMO | Admitting: Pharmacist

## 2021-05-09 DIAGNOSIS — E785 Hyperlipidemia, unspecified: Secondary | ICD-10-CM

## 2021-05-09 DIAGNOSIS — Z8546 Personal history of malignant neoplasm of prostate: Secondary | ICD-10-CM | POA: Diagnosis not present

## 2021-05-09 DIAGNOSIS — I10 Essential (primary) hypertension: Secondary | ICD-10-CM

## 2021-05-09 DIAGNOSIS — R739 Hyperglycemia, unspecified: Secondary | ICD-10-CM

## 2021-05-09 DIAGNOSIS — J45909 Unspecified asthma, uncomplicated: Secondary | ICD-10-CM | POA: Diagnosis not present

## 2021-05-09 DIAGNOSIS — N400 Enlarged prostate without lower urinary tract symptoms: Secondary | ICD-10-CM | POA: Diagnosis not present

## 2021-05-09 NOTE — Patient Instructions (Signed)
Visit Information  PATIENT GOALS: Goals Addressed            This Visit's Progress   . Chronic Care Management Pharmacy Care Plan       CARE PLAN ENTRY (see longitudinal plan of care for additional care plan information)  Current Barriers:  . Chronic Disease Management support, education, and care coordination needs related to Asthma (associates with allergies), HTN, Pre-DM, HLD, Allergies, BPH; history of prostate cancer.   Hypertension BP Readings from Last 3 Encounters:  03/15/21 129/61  12/06/20 136/68  09/06/20 132/63   . Pharmacist Clinical Goal(s): o Over the next 180 days, patient will work with PharmD and providers to maintain BP goal <140/90 . Current regimen:   Amlodipine 5mg  daily  Lisinopril 10mg  - take 2 tablets = 20mg  daily  . Patient self care activities - Over the next 180 days, patient will: o Maintain hypertension medication regimen.   Hyperlipidemia Lab Results  Component Value Date/Time   LDLCALC 80 03/15/2021 09:25 AM   LDLCALC 85 09/06/2020 10:00 AM   . Pharmacist Clinical Goal(s): o Over the next 180 days, patient will work with PharmD and providers to maintain LDL goal < 100 . Current regimen:  o Simvastatin 20mg  daily at bedtime . Patient self care activities - Over the next 180 days, patient will: o Maintain cholesterol medication regimen.   Pre-Diabetes Lab Results  Component Value Date/Time   HGBA1C 5.9 03/15/2021 09:25 AM   HGBA1C 5.7 12/06/2020 10:06 AM   . Pharmacist Clinical Goal(s): o Over the next 180 days, patient will work with PharmD and providers to maintain A1c goal <6.5% . Current regimen:  o Diet and exercise management   . Interventions: o Discussed a1c goals and congratulated patient on a1c being below pre-diabetes range . Patient self care activities - Over the next 180 days, patient will: o Maintain a1c <6.5% o Continue to exercise - goal is to get at least 150 minutes of physical activity per week.  BPH /  history of prostate cancer:  . Pharmacist Clinical Goal(s): o Over the next 180 days, patient will work with PharmD and providers to improve urinary symptoms and prevent recurrence of prostate cancer.  . Current regimen:  o Tamsulosin 0.4mg  twice a day . Interventions: o Discussed symptoms of BPH  . Patient self care activities - Over the next 180 days, patient will: o Discuss with urologist at appointment tomorrow 05/10/21 possibility of lowering tamsulosin dose and eventually tapering off since you are not experiencing any urinary problems since treatment of prostate cancer.   Medication management . Pharmacist Clinical Goal(s): o Over the next 180 days, patient will work with PharmD and providers to maintain optimal medication adherence . [facility] . Interventions o Comprehensive medication review performed. o Continue current medication management strategy . Patient self care activities - Over the next 180 days, patient will: o Focus on medication adherence by filling and taking medications appropriately  o Take medications as prescribed o Report any questions or concerns to PharmD and/or provider(s)  Please see past updates related to this goal by clicking on the "Past Updates" button in the selected goal        The patient verbalized understanding of instructions, educational materials, and care plan provided today and declined offer to receive copy of patient instructions, educational materials, and care plan.   Telephone follow up appointment with care management team member scheduled for:  6 months  Cherre Robins, PharmD Clinical Pharmacist [facility] [phone]

## 2021-05-09 NOTE — Progress Notes (Signed)
I have personally reviewed  pharmacist encounter including the documentation in this note and have collaborated with the care management provider regarding care management and care coordination activities to include development and update of the comprehensive care plan. I am certifying that I agree with the content of this note and encounter as supervising provider.  Mackie Pai, PA-C

## 2021-05-09 NOTE — Chronic Care Management (AMB) (Addendum)
Chronic Care Management Pharmacy Note  05/09/2021 Name:  Donald Porter MRN:  [id number] DOB:  09-Sep-1941  Subjective: Donald Porter is an 80 y.o. year old male who is a primary patient of [name], [name], [place].  The CCM team was consulted for assistance with disease management and care coordination needs.    Engaged with patient by telephone for follow up visit in response to provider referral for pharmacy case management and/or care coordination services.   Consent to Services:  The patient was given information about Chronic Care Management services, agreed to services, and gave verbal consent prior to initiation of services.  Please see initial visit note for detailed documentation.   Patient Care Team: [name], Iris Pert as PCP - General (Physician Assistant) Cherre Robins, PharmD (Pharmacist)  Recent office visits: 03/15/2021 - PCP ([place], [place]) f/u chronic conditions; no medication changes.   Recent consult visits: 11/28/2020 - Dermatology (Dr Tamala Julian) Cryotherapy on the forehead. There was redness to the area. Keflex 500 mg three times a day prescribed  Hospital visits: None in previous 6 months  Objective:  Lab Results  Component Value Date   CREATININE 0.97 03/15/2021   CREATININE 1.09 12/06/2020   CREATININE 1.21 (H) 09/06/2020    Lab Results  Component Value Date   HGBA1C 5.9 03/15/2021   Last diabetic Eye exam: No results found for: HMDIABEYEEXA  Last diabetic Foot exam: No results found for: HMDIABFOOTEX      Component Value Date/Time   CHOL 155 03/15/2021 0925   TRIG 56.0 03/15/2021 0925   HDL 63.80 03/15/2021 0925   CHOLHDL 2 03/15/2021 0925   VLDL 11.2 03/15/2021 0925   LDLCALC 80 03/15/2021 0925   LDLCALC 85 09/06/2020 1000    Hepatic Function Latest Ref Rng & Units 03/15/2021 12/06/2020 09/06/2020  Total Protein 6.0 - 8.3 g/dL 7.0 7.1 7.0  Albumin 3.5 - 5.2 g/dL 3.9 4.2 -  AST 0 - 37 U/L _0 ALT 0 - 53 U/L _1 Alk Phosphatase  39 - 117 U/L 50 47 -  Total Bilirubin 0.2 - 1.2 mg/dL 0.7 0.7 1.0    Lab Results  Component Value Date/Time   TSH 2.47 09/06/2020 10:00 AM   FREET4 1.2 09/06/2020 10:00 AM    CBC Latest Ref Rng & Units 09/06/2020 10/30/2016 11/09/2015  WBC 3.8 - 10.8 Thousand/uL 6.9 8.5 9.9  Hemoglobin 13.2 - 17.1 g/dL 13.9 14.3 14.8  Hematocrit 38.5 - 50.0 % 41.1 42.2 43.8  Platelets 140 - 400 Thousand/uL 234 249.0 277.0    Lab Results  Component Value Date/Time   VD25OH 34 09/06/2020 10:00 AM    Clinical ASCVD: No  The ASCVD Risk score ([place]., et al., 2013) failed to calculate for the following reasons:   The 2013 ASCVD risk score is only valid for ages 37 to 20    Other: (CHADS2VASc if Afib, PHQ9 if depression, MMRC or CAT for COPD, ACT, DEXA)  Social History   Tobacco Use  Smoking Status Never Smoker  Smokeless Tobacco Never Used   BP Readings from Last 3 Encounters:  03/15/21 129/61  12/06/20 136/68  09/06/20 132/63   Pulse Readings from Last 3 Encounters:  03/15/21 72  12/06/20 88  09/06/20 74   Wt Readings from Last 3 Encounters:  03/15/21 182 lb 12.8 oz (82.9 kg)  12/06/20 186 lb (84.4 kg)  09/06/20 179 lb 9.6 oz (81.5 kg)    Assessment: Review of patient past  medical history, allergies, medications, health status, including review of consultants reports, laboratory and other test data, was performed as part of comprehensive evaluation and provision of chronic care management services.   SDOH:  (Social Determinants of Health) assessments and interventions performed:  SDOH Interventions    Flowsheet Row Most Recent Value  SDOH Interventions   Financial Strain Interventions Intervention Not Indicated  Physical Activity Interventions Intervention Not Indicated       CCM Care Plan  No Known Allergies  Medications Reviewed Today     Reviewed by Legrand Rams, CMA (Certified Medical Assistant) on 03/15/21 at [phone]  Med List Status: <None>   Medication Order  Taking? Sig Documenting Provider Last Dose Status Informant  albuterol (PROAIR HFA) 108 (90 Base) MCG/ACT inhaler [id number] Yes TAKE 2 PUFFS BY MOUTH EVERY 6 HOURS AS NEEDED FOR WHEEZE OR SHORTNESS OF BREATH [name], Iris Pert Taking Active   amLODipine (NORVASC) 5 MG tablet [id number] Yes TAKE 1 TABLET BY MOUTH EVERY DAY [name], [name], PA-C Taking Active   cephALEXin (KEFLEX) 500 MG capsule [id number] No Take 1 capsule (500 mg total) by mouth 3 (three) times daily.  Patient not taking: Reported on 03/15/2021   Mackie Pai, PA-C Not Taking Consider Medication Status and Discontinue (Patient Preference)   lisinopril (ZESTRIL) 10 MG tablet [id number] Yes TAKE 2 TABLETS BY MOUTH EVERY DAY [name], [name], PA-C Taking Active   Multiple Vitamins-Minerals (MENS MULTIVITAMIN PLUS) TABS [id number] Yes Take by mouth daily. [provider] Taking Active   simvastatin (ZOCOR) 20 MG tablet [id number] Yes TAKE 1 TABLET (20 MG TOTAL) BY MOUTH AT BEDTIME. [name], [name], PA-C Taking Active   tamsulosin [facility]) 0.4 MG CAPS capsule [id number] Yes Take 1 capsule (0.4 mg total) by mouth 2 (two) times daily. Mackie Pai, PA-C Taking Active             Patient Active Problem List   Diagnosis Date Noted   Malignant neoplasm of prostate ([place]) 05/02/2020   Nuclear sclerotic cataract of left eye 08/26/2017   Hyperlipidemia 04/26/2015   Acute upper respiratory infection 11/22/2014   Allergic rhinitis 11/08/2014   Asthma due to seasonal allergies 11/08/2014   GERD (gastroesophageal reflux disease) 11/08/2014   Essential hypertension 11/08/2014    Immunization History  Administered Date(s) Administered   Fluad Quad(high Dose 65+) 09/20/2019, 12/06/2020   Influenza, High Dose Seasonal PF 10/17/2017, 10/07/2018   Influenza,inj,Quad PF,6+ Mos 11/09/2015   Influenza-Unspecified 09/18/2016   PFIZER(Purple Top)SARS-COV-2 Vaccination 01/08/2020, 01/29/2020   Pneumococcal Conjugate-13 02/05/2017    Pneumococcal Polysaccharide-23 12/07/2015   Tdap 07/10/2016   Zoster 11/09/2015    Conditions to be addressed/monitored: HTN, HLD and BPH; h/o prostate cancer; asthma related to allergies; pre DM  Care Plan : General Pharmacy (Adult)  Updates made by Cherre Robins, PHARMD since 05/09/2021 12:00 AM     Problem: [place] Disease Management support, education, and care coordination needs related to Asthma (associated with allergies), HTN, Pre-DM, HLD, Allergies, BPH; history of prostate cancer.   Priority: Medium  Onset Date: 05/09/2021  Note:   Current Barriers:  Chronic Disease Management support, education, and care coordination needs related to Asthma (associated with allergies), HTN, Pre-DM, HLD, Allergies, BPH; history of prostate cancer.  Pharmacist Clinical Goal(s):  Over the next 180 days, patient will maintain control of BP, lipids and BG as evidenced by maintaining goals below;  through collaboration with PharmD and provider.   Interventions: 1:1 collaboration with [name], [name], PA-C regarding development and update of comprehensive plan of  care as evidenced by provider attestation and co-signature Inter-disciplinary care team collaboration (see longitudinal plan of care) Comprehensive medication review performed; medication list updated in electronic medical record   Hypertension Controlled; BP goal <140/90 Current regimen:  Amlodipine 18m daily Lisinopril 169m- take 2 tablets = 2051maily  Pharmacist interventions: Recommend maintain current hypertension medication regimen.   Hyperlipidemia At Goal of  LDL < 100 Current regimen:  Simvastatin 37m43mily at bedtime Pharmacist Interventions: Reviewed last lipid panel with patient Recommend maintain current cholesterol medication regimen.   Pre-Diabetes At goal of A1c <6.5% Current regimen:  Diet and exercise management   Pharmacist Interventions: Discussed A1c goals Continue current diet of limiting sugar  intake Continue to exercise - goal is to get at least 150 minutes of physical activity per week.  Asthma associated with allergies:  Current regimen:  Albuterol inhaler as needed for SOB Patient reports he rarely needs albuterol - once per month or less.  Reports he just got refills because previous inhaler had expired before he used all 200 doses Pharmacist Interventions:  Continue to use albuterol as needed.    BPH / history of prostate cancer:  Treated for prostate cancer in 2021 Has follow up with urologist tomorrow 05/10/2021 Patient reports that he would like to stop tamsulosin since he is no longer experiencing urinary hesitancy or frequency. Current regimen:  Tamsulosin 0.4mg 52mce a day Interventions: Discussed symptoms of BPH  Recommended he discuss with urologist at appointment tomorrow 05/10/21 the possibility a trial of lower tamsulosin dose and eventually tapering off since urinary symptoms have improved  Medication management [facility] medications Interventions Comprehensive medication review performed. Reviewed recent refill history. Adherence is excellent based on fill history.  Continue current medication management strategy  Patient Goals/Self-Care Activities Over the next 180 days, patient will:  take medications as prescribed and target a minimum of 150 minutes of moderate intensity exercise weekly  Follow Up Plan: Telephone follow up appointment with care management team member scheduled for:  6 months     SH: [place] works part time as CPA (Engineer, maintenance (IT)tly during tax season)  Medication Assistance: None required.  Patient affirms current coverage meets needs.  Patient's preferred pharmacy is:  [id number] [address] [place] [address] [place] [address]: [phone] Up:  Patient agrees to Care Plan and  Follow-up.  Plan: Telephone follow up appointment with care management team member scheduled for:  6 months  Thana Ramp Cherre RobinsmD Clinical Pharmacist [facility] personally reviewed this encounter including the documentation in this note and have collaborated with the care management provider regarding care management and care coordination activities to include development and update of the comprehensive care plan. I am certifying that I agree with the content of this note and encounter as supervising provider.  EdwardMackie Pai

## 2021-05-10 DIAGNOSIS — C61 Malignant neoplasm of prostate: Secondary | ICD-10-CM | POA: Diagnosis not present

## 2021-05-16 ENCOUNTER — Telehealth: Payer: Self-pay | Admitting: Pharmacist

## 2021-05-16 NOTE — Chronic Care Management (AMB) (Signed)
error 

## 2021-05-17 DIAGNOSIS — R351 Nocturia: Secondary | ICD-10-CM | POA: Diagnosis not present

## 2021-05-17 DIAGNOSIS — C61 Malignant neoplasm of prostate: Secondary | ICD-10-CM | POA: Diagnosis not present

## 2021-06-11 ENCOUNTER — Ambulatory Visit: Payer: Medicare HMO | Admitting: *Deleted

## 2021-06-13 NOTE — Progress Notes (Addendum)
Subjective:   Donald Porter is a 80 y.o. male who presents for Medicare Annual/Subsequent preventive examination.  I connected with Donald Porter today by telephone and verified that I am speaking with the correct person using two identifiers. Location patient: home Location provider: work Persons participating in the virtual visit: patient, Donald Porter.    I discussed the limitations, risks, security and privacy concerns of performing an evaluation and management service by telephone and the availability of in person appointments. I also discussed with the patient that there may be a patient responsible charge related to this service. The patient expressed understanding and verbally consented to this telephonic visit.    Interactive audio and video telecommunications were attempted between this provider and patient, however failed, due to patient having technical difficulties OR patient did not have access to video capability.  We continued and completed visit with audio only.  Some vital signs may be absent or patient reported.   Time Spent with patient on telephone encounter: 40 minutes   Review of Systems     Cardiac Risk Factors include: advanced age (>30men, >106 women);male gender;hypertension;dyslipidemia     Objective:    Today's Vitals   06/14/21 0859  Weight: 182 lb (82.6 kg)  Height: 5\' 7"  (1.702 m)   Body mass index is 28.51 kg/m.  Advanced Directives 06/09/2020 05/02/2020 06/04/2019 06/01/2018 11/26/2014  Does Patient Have a Medical Advance Directive? Yes Yes Yes Yes No  Type of Paramedic of [place];Living will Living will;Healthcare Power of Attorney Living will;Healthcare Power of [place];Living will -  Does patient want to make changes to medical advance directive? - No - Patient declined No - Patient declined No - Patient declined -  Copy of [place] in Chart? No - copy requested No - copy requested No -  copy requested No - copy requested -  Would patient like information on creating a medical advance directive? - - - - No - patient declined information    Current Medications (verified) Outpatient Encounter Medications as of 06/14/2021  Medication Sig   amLODipine (NORVASC) 5 MG tablet Take 1 tablet (5 mg total) by mouth daily.   lisinopril (ZESTRIL) 10 MG tablet Take 2 tablets (20 mg total) by mouth daily.   Multiple Vitamins-Minerals (MENS MULTIVITAMIN PLUS) TABS Take by mouth daily.   simvastatin (ZOCOR) 20 MG tablet TAKE 1 TABLET (20 MG TOTAL) BY MOUTH AT BEDTIME.   tamsulosin (FLOMAX) 0.4 MG CAPS capsule Take 1 capsule (0.4 mg total) by mouth 2 (two) times daily.   albuterol (PROAIR HFA) 108 (90 Base) MCG/ACT inhaler TAKE 2 PUFFS BY MOUTH EVERY 6 HOURS AS NEEDED FOR WHEEZE OR SHORTNESS OF BREATH (Patient not taking: No sig reported)   No facility-administered encounter medications on file as of 06/14/2021.    Allergies (verified) Patient has no known allergies.   History: Past Medical History:  Diagnosis Date   Allergy    Asthma    1988   GERD (gastroesophageal reflux disease)    Hyperlipidemia 04/26/2015   Hypertension    Malignant neoplasm of prostate ([place]) 05/02/2020   Nuclear sclerotic cataract of left eye 08/26/2017   Prostate cancer [facility])    Past Surgical History:  Procedure Laterality Date   EYE SURGERY     Age 9   HERNIA REPAIR     3 surgeries   PROSTATE BIOPSY     Family History  Problem Relation Age of Onset   Heart  disease Mother    Lymphoma Father    Cancer Father    Lung cancer Maternal Uncle    Lung cancer Maternal Uncle    Breast cancer Neg Hx    Colon cancer Neg Hx    Pancreatic cancer Neg Hx    Prostate cancer Neg Hx    Social History   Socioeconomic History   Marital status: Widowed    Spouse name: Not on file   Number of children: 2   Years of education: Not on file   Highest education level: Not on file  Occupational History    Occupation: accountant    Comment: works part time  Tobacco Use   Smoking status: Never   Smokeless tobacco: Never  Vaping Use   Vaping Use: Never used  Substance and Sexual Activity   Alcohol use: Yes    Alcohol/week: 2.0 standard drinks    Types: 2 Glasses of wine per week    Comment: 1-2 glasses of wine 4-5 days per week   Drug use: Never   Sexual activity: Not Currently    Comment: widowed in 2018 after 15 years of marriage.  Other Topics Concern   Not on file  Social History Narrative   One living child. Lost his son at 72 to kidney disease. Daughter lives in [place].   Social Determinants of Health   Financial Resource Strain: Low Risk    Difficulty of Paying Living Expenses: Not hard at all  Food Insecurity: Not on file  Transportation Needs: Not on file  Physical Activity: Insufficiently Active   Days of Exercise per Week: 4 days   Minutes of Exercise per Session: 30 min  Stress: No Stress Concern Present   Feeling of Stress : Not at all  Social Connections: Not on file    Tobacco Counseling Counseling given: Not Answered   Clinical Intake:  Pre-visit preparation completed: Yes  Pain : No/denies pain     Nutritional Status: BMI 25 -29 Overweight Nutritional Risks: None Diabetes: No  How often do you need to have someone help you when you read instructions, pamphlets, or other written materials from your doctor or pharmacy?: 1 - Never  Diabetic?No  Interpreter Needed?: No  Information entered by :: Donald Hamman LPN   Activities of Daily Living In your present state of health, do you have any difficulty performing the following activities: 06/14/2021  Hearing? N  Vision? N  Difficulty concentrating or making decisions? N  Walking or climbing stairs? N  Dressing or bathing? N  Doing errands, shopping? N  Preparing Food and eating ? N  Using the Toilet? N  In the past six months, have you accidently leaked urine? Y  Comment  occasionally-seeing a  urologist  Do you have problems with loss of bowel control? N  Managing your Medications? N  Managing your Finances? N  Housekeeping or managing your Housekeeping? N  Some recent data might be hidden    Patient Care Team: [name], Iris Pert as PCP - General (Physician Assistant) Cherre Robins, PharmD (Pharmacist)  Indicate any recent Medical Services you may have received from other than [facility] providers in the past year (date may be approximate).     Assessment:   This is a routine wellness examination for Donald Porter.  Hearing/Vision screen Hearing Screening - Comments:: No issues  Vision Screening - Comments:: Last eye exam 2021  Dietary issues and exercise activities discussed: Current Exercise Habits: Home exercise routine, Type of exercise: walking, Time (Minutes): 30,  Frequency (Times/Week): 4, Weekly Exercise (Minutes/Week): 120, Intensity: Mild, Exercise limited by: None identified   Goals Addressed               This Visit's Progress     Patient Stated     Maintain healthy activity (pt-stated)   On track     Other     Patient Stated   On track     Increase walking        Depression Screen PHQ 2/9 Scores 06/14/2021 06/09/2020 06/04/2019 06/01/2018 02/05/2017 11/09/2015 11/08/2014  PHQ - 2 Score 1 0 0 0 0 0 0    Fall Risk Fall Risk  06/14/2021 03/15/2021 06/09/2020 06/04/2019 06/01/2018  Falls in the past year? 0 0 - 0 No  Comment - - - - -  Number falls in past yr: 0 0 0 - -  Injury with Fall? 0 0 0 - -  Risk for fall due to : - No Fall Risks No Fall Risks - -  Follow up Falls prevention discussed Falls evaluation completed - - -    FALL RISK PREVENTION PERTAINING TO THE HOME:  Any stairs in or around the home? Yes  If so, are there any without handrails? No  Home free of loose throw rugs in walkways, pet beds, electrical cords, etc? Yes  Adequate lighting in your home to reduce risk of falls? Yes   ASSISTIVE DEVICES UTILIZED TO PREVENT  FALLS:  Life alert? No  Use of a cane, walker or w/c? No  Grab bars in the bathroom? Yes  Shower chair or bench in shower? No  Elevated toilet seat or a handicapped toilet? No   TIMED UP AND GO:  Was the test performed? No . Phone visit   Cognitive Function:Normal cognitive status assessed by this Nurse Health Advisor. No abnormalities found.          Immunizations Immunization History  Administered Date(s) Administered   Fluad Quad(high Dose 65+) 09/20/2019, 12/06/2020   Influenza, High Dose Seasonal PF 10/17/2017, 10/07/2018   Influenza,inj,Quad PF,6+ Mos 11/09/2015   Influenza-Unspecified 09/18/2016   PFIZER(Purple Top)SARS-COV-2 Vaccination 01/08/2020, 01/29/2020, 10/11/2020   Pneumococcal Conjugate-13 02/05/2017   Pneumococcal Polysaccharide-23 12/07/2015   Tdap 07/10/2016   Zoster, Live 11/09/2015    TDAP status: Up to date  Flu Vaccine status: Up to date  Pneumococcal vaccine status: Up to date  Covid-19 vaccine status: Completed vaccines  Qualifies for Shingles Vaccine? Yes   Zostavax completed Yes   Shingrix Completed?: No.    Education has been provided regarding the importance of this vaccine. Patient has been advised to call insurance company to determine out of pocket expense if they have not yet received this vaccine. Advised may also receive vaccine at local pharmacy or Health Dept. Verbalized acceptance and understanding.  Screening Tests Health Maintenance  Topic Date Due   Zoster Vaccines- Shingrix (1 of 2) Never done   COVID-19 Vaccine (4 - Booster for Pfizer series) 01/11/2021   INFLUENZA VACCINE  07/30/2021   TETANUS/TDAP  07/10/2026   PNA vac Low Risk Adult  Completed   HPV VACCINES  Aged Out    Health Maintenance  Health Maintenance Due  Topic Date Due   Zoster Vaccines- Shingrix (1 of 2) Never done   COVID-19 Vaccine (4 - Booster for Pfizer series) 01/11/2021    Colorectal cancer screening: No longer required.   Lung Cancer  Screening: (Low Dose CT Chest recommended if Age 64-80 years, 30 pack-year currently smoking OR have  quit w/in 15years.) does not qualify.     Additional Screening:  Hepatitis C Screening: does not qualify  Vision Screening: Recommended annual ophthalmology exams for early detection of glaucoma and other disorders of the eye. Is the patient up to date with their annual eye exam?  Yes  Who is the provider or what is the name of the office in which the patient attends annual eye exams? Patient unsure of name   Dental Screening: Recommended annual dental exams for proper oral hygiene  Community Resource Referral / Chronic Care Management: CRR required this visit?  No   CCM required this visit?  No      Plan:     I have personally reviewed and noted the following in the patient's chart:   Medical and social history Use of alcohol, tobacco or illicit drugs  Current medications and supplements including opioid prescriptions. Patient is not currently taking opioid prescriptions. Functional ability and status Nutritional status Physical activity Advanced directives List of other physicians Hospitalizations, surgeries, and ER visits in previous 12 months Vitals Screenings to include cognitive, depression, and falls Referrals and appointments  In addition, I have reviewed and discussed with patient certain preventive protocols, quality metrics, and best practice recommendations. A written personalized care plan for preventive services as well as general preventive health recommendations were provided to patient.   Due to this being a telephonic visit, the after visit summary with patients personalized plan was offered to patient via mail or my-chart. Per request, patient was mailed a copy of AVS./ Patient preferred to pick up at office at next visit.    Marta Antu, LPN   [phone]  Nurse Health Advisor  Nurse Notes: None  Agree with summary, recommendation and  evaluation of lpn.  Mackie Pai, PA-C

## 2021-06-14 ENCOUNTER — Ambulatory Visit: Payer: Medicare HMO | Admitting: Medical

## 2021-06-14 ENCOUNTER — Ambulatory Visit (INDEPENDENT_AMBULATORY_CARE_PROVIDER_SITE_OTHER): Payer: Medicare HMO

## 2021-06-14 VITALS — Ht 67.0 in | Wt 182.0 lb

## 2021-06-14 DIAGNOSIS — Z0000 Encounter for general adult medical examination without abnormal findings: Secondary | ICD-10-CM | POA: Diagnosis not present

## 2021-06-14 NOTE — Patient Instructions (Signed)
[name] , Thank you for taking time to complete your Medicare Wellness Visit. I appreciate your ongoing commitment to your health goals. Please review the following plan we discussed and let me know if I can assist you in the future.   Screening recommendations/referrals: Colonoscopy: No longer required Recommended yearly ophthalmology/optometry visit for glaucoma screening and checkup Recommended yearly dental visit for hygiene and checkup  Vaccinations: Influenza vaccine: Up to date Pneumococcal vaccine: Up to date Tdap vaccine: Up to date-Due-07/10/2026 Shingles vaccine: Discuss with pharmacy   Covid-19: Up to date  Advanced directives: Please bring a copy for your chart  Conditions/risks identified: See problem list  Next appointment: Follow up in one year for your annual wellness visit. 06/18/2022 @ 9:00  Preventive Care 82 Years and Older, Male Preventive care refers to lifestyle choices and visits with your health care provider that can promote health and wellness. What does preventive care include? A yearly physical exam. This is also called an annual well check. Dental exams once or twice a year. Routine eye exams. Ask your health care provider how often you should have your eyes checked. Personal lifestyle choices, including: Daily care of your teeth and gums. Regular physical activity. Eating a healthy diet. Avoiding tobacco and drug use. Limiting alcohol use. Practicing safe sex. Taking low doses of aspirin every day. Taking vitamin and mineral supplements as recommended by your health care provider. What happens during an annual well check? The services and screenings done by your health care provider during your annual well check will depend on your age, overall health, lifestyle risk factors, and family history of disease. Counseling  Your health care provider may ask you questions about your: Alcohol use. Tobacco use. Drug use. Emotional well-being. Home  and relationship well-being. Sexual activity. Eating habits. History of falls. Memory and ability to understand (cognition). Work and work Statistician. Screening  You may have the following tests or measurements: Height, weight, and BMI. Blood pressure. Lipid and cholesterol levels. These may be checked every 5 years, or more frequently if you are over 82 years old. Skin check. Lung cancer screening. You may have this screening every year starting at age 82 if you have a 30-pack-year history of smoking and currently smoke or have quit within the past 15 years. Fecal occult blood test (FOBT) of the stool. You may have this test every year starting at age 82. Flexible sigmoidoscopy or colonoscopy. You may have a sigmoidoscopy every 5 years or a colonoscopy every 10 years starting at age 82. Prostate cancer screening. Recommendations will vary depending on your family history and other risks. Hepatitis C blood test. Hepatitis B blood test. Sexually transmitted disease (STD) testing. Diabetes screening. This is done by checking your blood sugar (glucose) after you have not eaten for a while (fasting). You may have this done every 1-3 years. Abdominal aortic aneurysm (AAA) screening. You may need this if you are a current or former smoker. Osteoporosis. You may be screened starting at age 82 if you are at high risk. Talk with your health care provider about your test results, treatment options, and if necessary, the need for more tests. Vaccines  Your health care provider may recommend certain vaccines, such as: Influenza vaccine. This is recommended every year. Tetanus, diphtheria, and acellular pertussis (Tdap, Td) vaccine. You may need a Td booster every 10 years. Zoster vaccine. You may need this after age 82. Pneumococcal 13-valent conjugate (PCV13) vaccine. One dose is recommended after age 82. Pneumococcal polysaccharide (  PPSV23) vaccine. One dose is recommended after age 82. Talk to  your health care provider about which screenings and vaccines you need and how often you need them. This information is not intended to replace advice given to you by your health care provider. Make sure you discuss any questions you have with your health care provider. Document Released: 01/12/2016 Document Revised: 09/04/2016 Document Reviewed: 10/17/2015 Elsevier Interactive Patient Education  2017 [place] Prevention in the Home Falls can cause injuries. They can happen to people of all ages. There are many things you can do to make your home safe and to help prevent falls. What can I do on the outside of my home? Regularly fix the edges of walkways and driveways and fix any cracks. Remove anything that might make you trip as you walk through a door, such as a raised step or threshold. Trim any bushes or trees on the path to your home. Use bright outdoor lighting. Clear any walking paths of anything that might make someone trip, such as rocks or tools. Regularly check to see if handrails are loose or broken. Make sure that both sides of any steps have handrails. Any raised decks and porches should have guardrails on the edges. Have any leaves, snow, or ice cleared regularly. Use sand or salt on walking paths during winter. Clean up any spills in your garage right away. This includes oil or grease spills. What can I do in the bathroom? Use night lights. Install grab bars by the toilet and in the tub and shower. Do not use towel bars as grab bars. Use non-skid mats or decals in the tub or shower. If you need to sit down in the shower, use a plastic, non-slip stool. Keep the floor dry. Clean up any water that spills on the floor as soon as it happens. Remove soap buildup in the tub or shower regularly. Attach bath mats securely with double-sided non-slip rug tape. Do not have throw rugs and other things on the floor that can make you trip. What can I do in the bedroom? Use  night lights. Make sure that you have a light by your bed that is easy to reach. Do not use any sheets or blankets that are too big for your bed. They should not hang down onto the floor. Have a firm chair that has side arms. You can use this for support while you get dressed. Do not have throw rugs and other things on the floor that can make you trip. What can I do in the kitchen? Clean up any spills right away. Avoid walking on wet floors. Keep items that you use a lot in easy-to-reach places. If you need to reach something above you, use a strong step stool that has a grab bar. Keep electrical cords out of the way. Do not use floor polish or wax that makes floors slippery. If you must use wax, use non-skid floor wax. Do not have throw rugs and other things on the floor that can make you trip. What can I do with my stairs? Do not leave any items on the stairs. Make sure that there are handrails on both sides of the stairs and use them. Fix handrails that are broken or loose. Make sure that handrails are as long as the stairways. Check any carpeting to make sure that it is firmly attached to the stairs. Fix any carpet that is loose or worn. Avoid having throw rugs at the top or  bottom of the stairs. If you do have throw rugs, attach them to the floor with carpet tape. Make sure that you have a light switch at the top of the stairs and the bottom of the stairs. If you do not have them, ask someone to add them for you. What else can I do to help prevent falls? Wear shoes that: Do not have high heels. Have rubber bottoms. Are comfortable and fit you well. Are closed at the toe. Do not wear sandals. If you use a stepladder: Make sure that it is fully opened. Do not climb a closed stepladder. Make sure that both sides of the stepladder are locked into place. Ask someone to hold it for you, if possible. Clearly mark and make sure that you can see: Any grab bars or handrails. First and last  steps. Where the edge of each step is. Use tools that help you move around (mobility aids) if they are needed. These include: Canes. Walkers. Scooters. Crutches. Turn on the lights when you go into a dark area. Replace any light bulbs as soon as they burn out. Set up your furniture so you have a clear path. Avoid moving your furniture around. If any of your floors are uneven, fix them. If there are any pets around you, be aware of where they are. Review your medicines with your doctor. Some medicines can make you feel dizzy. This can increase your chance of falling. Ask your doctor what other things that you can do to help prevent falls. This information is not intended to replace advice given to you by your health care provider. Make sure you discuss any questions you have with your health care provider. Document Released: 10/12/2009 Document Revised: 05/23/2016 Document Reviewed: 01/20/2015 Elsevier Interactive Patient Education  2017 [employer].

## 2021-06-15 ENCOUNTER — Other Ambulatory Visit: Payer: Self-pay | Admitting: Medical

## 2021-06-21 ENCOUNTER — Ambulatory Visit: Payer: Medicare HMO | Admitting: Medical

## 2021-07-01 ENCOUNTER — Other Ambulatory Visit: Payer: Self-pay | Admitting: Medical

## 2021-07-03 DIAGNOSIS — L570 Actinic keratosis: Secondary | ICD-10-CM | POA: Diagnosis not present

## 2021-07-03 DIAGNOSIS — L821 Other seborrheic keratosis: Secondary | ICD-10-CM | POA: Diagnosis not present

## 2021-07-03 DIAGNOSIS — Z85828 Personal history of other malignant neoplasm of skin: Secondary | ICD-10-CM | POA: Diagnosis not present

## 2021-07-03 DIAGNOSIS — D485 Neoplasm of uncertain behavior of skin: Secondary | ICD-10-CM | POA: Diagnosis not present

## 2021-07-12 ENCOUNTER — Ambulatory Visit: Payer: Medicare HMO | Admitting: Medical

## 2021-07-17 ENCOUNTER — Telehealth: Payer: Self-pay | Admitting: Pharmacist

## 2021-07-17 NOTE — Progress Notes (Deleted)
    Chronic Care Management Pharmacy Assistant   Name: Donald Porter  MRN: [id number] DOB: [birth year]  Reason for Encounter: General Adherence Disease State    Recent office visits:  None noted  Recent consult visits:  05/17/21-Eugene Bell III. (Urology) Notes not provided.  Hospital visits:  None in previous 6 months  Medications: Outpatient Encounter Medications as of 07/17/2021  Medication Sig   albuterol (PROAIR HFA) 108 (90 Base) MCG/ACT inhaler TAKE 2 PUFFS BY MOUTH EVERY 6 HOURS AS NEEDED FOR WHEEZE OR SHORTNESS OF BREATH (Patient not taking: No sig reported)   amLODipine (NORVASC) 5 MG tablet TAKE 1 TABLET (5 MG TOTAL) BY MOUTH DAILY.   lisinopril (ZESTRIL) 10 MG tablet TAKE 2 TABLETS BY MOUTH EVERY DAY   Multiple Vitamins-Minerals (MENS MULTIVITAMIN PLUS) TABS Take by mouth daily.   simvastatin (ZOCOR) 20 MG tablet TAKE 1 TABLET (20 MG TOTAL) BY MOUTH AT BEDTIME.   tamsulosin (FLOMAX) 0.4 MG CAPS capsule Take 1 capsule (0.4 mg total) by mouth 2 (two) times daily.   No facility-administered encounter medications on file as of 07/17/2021.   Have you had any problems recently with your health?   Have you had any problems with your pharmacy?   What issues or side effects are you having with your medications?   What would you like me to pass along to [place] for them to help you with?    What can we do to take care of you better?    Star Rating Drugs: Lisinopril 10 mg Last filled:06/15/21 90 DS Simvastatin 20 mg Last filled:05/14/21 90 DS  Myriam Elta [place], [place]

## 2021-07-25 NOTE — Chronic Care Management (AMB) (Signed)
    Chronic Care Management Pharmacy Assistant   Name: Donald Porter  MRN: [id number] DOB: 11-09-1941  Reason for Encounter: General Adherence Disease State   Recent office visits:  None noted   Recent consult visits:  05/17/21-Eugene Bell III. (Urology) Notes not provided.   Hospital visits:  None in previous 6 months Medications: Outpatient Encounter Medications as of 07/17/2021  Medication Sig   albuterol (PROAIR HFA) 108 (90 Base) MCG/ACT inhaler TAKE 2 PUFFS BY MOUTH EVERY 6 HOURS AS NEEDED FOR WHEEZE OR SHORTNESS OF BREATH (Patient not taking: No sig reported)   amLODipine (NORVASC) 5 MG tablet TAKE 1 TABLET (5 MG TOTAL) BY MOUTH DAILY.   lisinopril (ZESTRIL) 10 MG tablet TAKE 2 TABLETS BY MOUTH EVERY DAY   Multiple Vitamins-Minerals (MENS MULTIVITAMIN PLUS) TABS Take by mouth daily.   simvastatin (ZOCOR) 20 MG tablet TAKE 1 TABLET (20 MG TOTAL) BY MOUTH AT BEDTIME.   tamsulosin (FLOMAX) 0.4 MG CAPS capsule Take 1 capsule (0.4 mg total) by mouth 2 (two) times daily.   No facility-administered encounter medications on file as of 07/17/2021.    No facility-administered encounter medications on file as of 07/17/2021.    I was unable to reach patient for his General Assessment call. I have left multiple voicemail's for him so he can try to return my phone call.     Star Rating Drugs: Lisinopril 10 mg Last filled:06/15/21 90 DS Simvastatin 20 mg Last filled:05/14/21 90 DS   Myriam Elta [place], [place]

## 2021-07-30 ENCOUNTER — Telehealth: Payer: Self-pay | Admitting: Pharmacist

## 2021-07-30 NOTE — Chronic Care Management (AMB) (Unsigned)
    Chronic Care Management Pharmacy Assistant   Name: Donald Porter  MRN: [id number] DOB: [birth year]  Reason for Encounter: General Adherence  Recent office visits:  None noted  Recent consult visits:  05/17/21 [name] (Urology)- Patient seen for Malignant neoplasm of prostate.  Hospital visits:  None in previous 6 months  Medications: Outpatient Encounter Medications as of 07/30/2021  Medication Sig   albuterol (PROAIR HFA) 108 (90 Base) MCG/ACT inhaler TAKE 2 PUFFS BY MOUTH EVERY 6 HOURS AS NEEDED FOR WHEEZE OR SHORTNESS OF BREATH (Patient not taking: No sig reported)   amLODipine (NORVASC) 5 MG tablet TAKE 1 TABLET (5 MG TOTAL) BY MOUTH DAILY.   lisinopril (ZESTRIL) 10 MG tablet TAKE 2 TABLETS BY MOUTH EVERY DAY   Multiple Vitamins-Minerals (MENS MULTIVITAMIN PLUS) TABS Take by mouth daily.   simvastatin (ZOCOR) 20 MG tablet TAKE 1 TABLET (20 MG TOTAL) BY MOUTH AT BEDTIME.   tamsulosin (FLOMAX) 0.4 MG CAPS capsule Take 1 capsule (0.4 mg total) by mouth 2 (two) times daily.   No facility-administered encounter medications on file as of 07/30/2021.    Have you had any problems recently with your health?  Have you had any problems with your pharmacy?  What issues or side effects are you having with your medications?  What would you like me to pass along to [employer], [facility] for them to help you with?   What can we do to take care of you better?   Star Rating Drugs: lisinopril 10 MG tablet last filled 06/15/21 90 DS simvastatin 20 MG tablet last filled 05/14/21 90 DS  [place] Clinical Pharmacist Assistant [phone]

## 2021-08-09 ENCOUNTER — Ambulatory Visit: Payer: Medicare HMO | Admitting: Medical

## 2021-08-09 ENCOUNTER — Other Ambulatory Visit: Payer: Self-pay | Admitting: Medical

## 2021-08-20 ENCOUNTER — Telehealth (INDEPENDENT_AMBULATORY_CARE_PROVIDER_SITE_OTHER): Payer: Medicare HMO | Admitting: Family Medicine

## 2021-08-20 ENCOUNTER — Other Ambulatory Visit: Payer: Self-pay

## 2021-08-20 ENCOUNTER — Other Ambulatory Visit: Payer: Self-pay | Admitting: Medical

## 2021-08-20 ENCOUNTER — Encounter: Payer: Self-pay | Admitting: Family Medicine

## 2021-08-20 VITALS — Temp 99.2°F

## 2021-08-20 DIAGNOSIS — U071 COVID-19: Secondary | ICD-10-CM | POA: Diagnosis not present

## 2021-08-20 MED ORDER — BENZONATATE 100 MG PO CAPS: 100.0000 mg | ORAL_CAPSULE | Freq: Three times a day (TID) | ORAL | 0 refills | Status: DC | PRN

## 2021-08-20 MED ORDER — MOLNUPIRAVIR EUA 200MG CAPSULE: 4.0000 | ORAL_CAPSULE | Freq: Two times a day (BID) | ORAL | 0 refills | Status: AC

## 2021-08-20 NOTE — Progress Notes (Signed)
Chief Complaint  Patient presents with   Covid Positive   Sore Throat    Tested positive Covid 08/18/21 Nasal congestion    Fever   Cough    [name] here for URI complaints. Due to COVID-19 pandemic, we are interacting via telephone.  I verified patient's ID using 2 identifiers. Patient agreed to proceed with visit via this method. Patient is at home, I am at office. Patient and I are present for visit.   Duration: 3 days  Associated symptoms: Fever (99 F), rhinorrhea, sore throat, and coughing, myalgias, loss of taste, diarrhea, fatigue, lack of appetite Denies: sinus pain, itchy watery eyes, ear pain, ear drainage, wheezing, shortness of breath, and N/V, loss of smell Treatment to date: Advil, salt water gargles Sick contacts: Yes- friend Tested + for covid on 8/20.   Past Medical History:  Diagnosis Date   Allergy    Asthma    1988   GERD (gastroesophageal reflux disease)    Hyperlipidemia 04/26/2015   Hypertension    Malignant neoplasm of prostate ([place]) 05/02/2020   Nuclear sclerotic cataract of left eye 08/26/2017   Prostate cancer ([place])     Objective Temp 99.2 F (37.3 C) (Oral)  No conversational dyspnea Age appropriate judgment and insight Nml affect and mood  COVID-19 - Plan: molnupiravir EUA 200 mg CAPS, benzonatate (TESSALON) 100 MG capsule  Given age and co-morbidities, will rx antiviral. Cough med prn. Advil +/- Tylenol. Quarantining guidelines discussed.  Continue to push fluids, practice good hand hygiene, cover mouth when coughing. F/u prn. If starting to experience irreplaceable fluid loss, shaking, or shortness of breath, seek immediate care. Total time: 13 min Pt voiced understanding and agreement to the plan.  [place], DO 08/20/21 2:26 PM

## 2021-08-22 ENCOUNTER — Ambulatory Visit: Payer: Medicare HMO | Admitting: Medical

## 2021-10-30 ENCOUNTER — Ambulatory Visit (INDEPENDENT_AMBULATORY_CARE_PROVIDER_SITE_OTHER): Payer: Medicare HMO | Admitting: Medical

## 2021-10-30 ENCOUNTER — Other Ambulatory Visit: Payer: Self-pay

## 2021-10-30 VITALS — BP 116/60 | HR 76 | Temp 97.9°F | Resp 16 | Ht 67.0 in | Wt 170.4 lb

## 2021-10-30 DIAGNOSIS — I10 Essential (primary) hypertension: Secondary | ICD-10-CM

## 2021-10-30 DIAGNOSIS — E785 Hyperlipidemia, unspecified: Secondary | ICD-10-CM

## 2021-10-30 DIAGNOSIS — N400 Enlarged prostate without lower urinary tract symptoms: Secondary | ICD-10-CM | POA: Diagnosis not present

## 2021-10-30 DIAGNOSIS — Z23 Encounter for immunization: Secondary | ICD-10-CM | POA: Diagnosis not present

## 2021-10-30 DIAGNOSIS — R739 Hyperglycemia, unspecified: Secondary | ICD-10-CM

## 2021-10-30 DIAGNOSIS — Z8546 Personal history of malignant neoplasm of prostate: Secondary | ICD-10-CM

## 2021-10-30 DIAGNOSIS — N3941 Urge incontinence: Secondary | ICD-10-CM

## 2021-10-30 LAB — LIPID PANEL
Cholesterol: 152 mg/dL (ref 0–200)
HDL: 72.3 mg/dL (ref >39.00)
LDL Cholesterol: 67 mg/dL (ref 0–99)
NonHDL: 79.71
Total CHOL/HDL Ratio: 2
Triglycerides: 62 mg/dL (ref 0.0–149.0)
VLDL: 12.4 mg/dL (ref 0.0–40.0)

## 2021-10-30 LAB — COMPREHENSIVE METABOLIC PANEL
ALT: 15 U/L (ref 0–53)
AST: 20 U/L (ref 0–37)
Albumin: 4 g/dL (ref 3.5–5.2)
Alkaline Phosphatase: 49 U/L (ref 39–117)
BUN: 22 mg/dL (ref 6–23)
CO2: 29 mEq/L (ref 19–32)
Calcium: 9.6 mg/dL (ref 8.4–10.5)
Chloride: 96 mEq/L (ref 96–112)
Creatinine, Ser: 1.03 mg/dL (ref 0.40–1.50)
GFR: 67.85 mL/min (ref >60.00)
Glucose, Bld: 96 mg/dL (ref 70–99)
Potassium: 4.5 mEq/L (ref 3.5–5.1)
Sodium: 133 mEq/L — ABNORMAL LOW (ref 135–145)
Total Bilirubin: 1 mg/dL (ref 0.2–1.2)
Total Protein: 7 g/dL (ref 6.0–8.3)

## 2021-10-30 LAB — HEMOGLOBIN A1C: Hgb A1c MFr Bld: 5.8 % (ref 4.6–6.5)

## 2021-10-30 NOTE — Progress Notes (Signed)
Subjective:    Patient ID: Donald Porter, male    DOB: 1941-07-24, 80 y.o.   MRN: [id number]  HPI Pt Iin for follow up.  He updates me that he had covid at end of August. He had 3 vaccines in past. He saw Dr. Nani Ravens and took antiviral for 5 days. Pt states it took him about 3 weeks to recover. Lost appetite and lost smell temporarily. He lost about 10 pounds during that time.  Pt saw his urologist in July. PSA was 1.9. Prior radiation therapy and psa was 17.   Pt states some frequent urination and some incontinence. Pt was given ditropan from urologist office. Pt wants to get Mens [employer] adult diaper due to the incontinence. I may get form to fill out per pt. Pt has some incontinence depiste using flomax 0.8 and ditropan.  Htn-well controlled with amlodipine and lisinopril.   High cholesterol and on simvastatin.  Elevated sugar and a1c in past well controlled.   Review of Systems  Constitutional:  Negative for chills, fatigue and fever.  HENT:  Negative for dental problem.   Eyes:  Negative for photophobia, pain and itching.  Respiratory:  Negative for cough, chest tightness, shortness of breath and wheezing.   Cardiovascular:  Negative for chest pain and palpitations.  Gastrointestinal:  Negative for abdominal pain, constipation, diarrhea and rectal pain.  Genitourinary:  Positive for frequency and urgency. Negative for decreased urine volume, dysuria and testicular pain.  Musculoskeletal:  Negative for back pain and myalgias.    Past Medical History:  Diagnosis Date   Allergy    Asthma    1988   GERD (gastroesophageal reflux disease)    Hyperlipidemia 04/26/2015   Hypertension    Malignant neoplasm of prostate ([place]) 05/02/2020   Nuclear sclerotic cataract of left eye 08/26/2017   Prostate cancer [facility])      Social History   Socioeconomic History   Marital status: Widowed    Spouse name: Not on file   Number of children: 2   Years of education: Not on file    Highest education level: Not on file  Occupational History   Occupation: accountant    Comment: works part time  Tobacco Use   Smoking status: Never   Smokeless tobacco: Never  Vaping Use   Vaping Use: Never used  Substance and Sexual Activity   Alcohol use: Yes    Alcohol/week: 2.0 standard drinks    Types: 2 Glasses of wine per week    Comment: 1-2 glasses of wine 4-5 days per week   Drug use: Never   Sexual activity: Not Currently    Comment: widowed in 2018 after 82 years of marriage.  Other Topics Concern   Not on file  Social History Narrative   One living child. Lost his son at 63 to kidney disease. Daughter lives in [place].   Social Determinants of Health   Financial Resource Strain: Low Risk    Difficulty of Paying Living Expenses: Not hard at all  Food Insecurity: No Food Insecurity   Worried About Charity fundraiser in the Last Year: Never true   Arboriculturist in the Last Year: Never true  Transportation Needs: Unmet Transportation Needs   Lack of Transportation (Medical): Yes   Lack of Transportation (Non-Medical): Yes  Physical Activity: Insufficiently Active   Days of Exercise per Week: 4 days   Minutes of Exercise per Session: 30 min  Stress: No Stress Concern Present  Feeling of Stress : Not at all  Social Connections: Moderately Isolated   Frequency of Communication with Friends and Family: More than three times a week   Frequency of Social Gatherings with Friends and Family: More than three times a week   Attends Religious Services: 1 to 4 times per year   Active Member of [employer] or Organizations: No   Attends Archivist Meetings: Never   Marital Status: Widowed  Human resources officer Violence: Not At Risk   Fear of Current or Ex-Partner: No   Emotionally Abused: No   Physically Abused: No   Sexually Abused: No    Past Surgical History:  Procedure Laterality Date   EYE SURGERY     Age 80   HERNIA REPAIR     3 surgeries   PROSTATE  BIOPSY      Family History  Problem Relation Age of Onset   Heart disease Mother    Lymphoma Father    Cancer Father    Lung cancer Maternal Uncle    Lung cancer Maternal Uncle    Breast cancer Neg Hx    Colon cancer Neg Hx    Pancreatic cancer Neg Hx    Prostate cancer Neg Hx     No Known Allergies  Current Outpatient Medications on File Prior to Visit  Medication Sig Dispense Refill   albuterol (PROAIR HFA) 108 (90 Base) MCG/ACT inhaler TAKE 2 PUFFS BY MOUTH EVERY 6 HOURS AS NEEDED FOR WHEEZE OR SHORTNESS OF BREATH 90 g 2   amLODipine (NORVASC) 5 MG tablet TAKE 1 TABLET (5 MG TOTAL) BY MOUTH DAILY. 90 tablet 0   benzonatate (TESSALON) 100 MG capsule Take 1 capsule (100 mg total) by mouth 3 (three) times daily as needed. 30 capsule 0   lisinopril (ZESTRIL) 10 MG tablet TAKE 2 TABLETS BY MOUTH EVERY DAY 180 tablet 1   Multiple Vitamins-Minerals (MENS MULTIVITAMIN PLUS) TABS Take by mouth daily.     simvastatin (ZOCOR) 20 MG tablet TAKE 1 TABLET BY MOUTH EVERYDAY AT BEDTIME 90 tablet 1   tamsulosin (FLOMAX) 0.4 MG CAPS capsule Take 1 capsule (0.4 mg total) by mouth 2 (two) times daily. 180 capsule 3   No current facility-administered medications on file prior to visit.    BP 116/60   Pulse 76   Temp 97.9 F (36.6 C)   Resp 16   Ht 5\' 7"  (1.702 m)   Wt 170 lb 6.4 oz (77.3 kg)   SpO2 99%   BMI 26.69 kg/m     Objective:   Physical Exam  General- No acute distress. Pleasant patient. Neck- Full range of motion, no jvd Lungs- Clear, even and unlabored. Heart- regular rate and rhythm. Neurologic- CNII- XII grossly intact.  Back- no cva tenderness.  Assessment & Plan:   Patient Instructions  Htn well controlled. Continue amlodipine and lisinopril.  For high cholesterol will place cmp and lipid panel.  For elevated sugar follow a1c. Recommend low sugar diet and some daily exercise.  For bph, frequent urination and incontinence. Continue flomax and ditropan. I will  look at mens liberty form. If not authorized by medicare may send form to urologist.  Can get covid booster down stairs.  Follow up date to be determined after lab review.    Mackie Pai, PA-C

## 2021-10-30 NOTE — Patient Instructions (Addendum)
Htn well controlled. Continue amlodipine and lisinopril.  For high cholesterol will place cmp and lipid panel.  For elevated sugar follow a1c. Recommend low sugar diet and some daily exercise.  For bph, frequent urination and incontinence. Continue flomax and ditropan. I will look at mens liberty form. If not authorized by medicare may send form to urologist.  Can get covid booster down stairs.  Follow up date to be determined after lab review.

## 2021-10-31 ENCOUNTER — Ambulatory Visit: Payer: Medicare HMO | Admitting: Medical

## 2021-11-08 ENCOUNTER — Telehealth: Payer: Self-pay | Admitting: Medical

## 2021-11-08 DIAGNOSIS — C61 Malignant neoplasm of prostate: Secondary | ICD-10-CM | POA: Diagnosis not present

## 2021-11-08 NOTE — Telephone Encounter (Signed)
Spoke with Shirlean Mylar at [employer] liberty made her aware that the form was faced to medical records and they should get notes sometime next week

## 2021-11-08 NOTE — Telephone Encounter (Signed)
Form faxed to medical records yesterday 11/07/21

## 2021-11-08 NOTE — Telephone Encounter (Signed)
[facility]: [phone]  Received office notes from 11/1. Stated that item requested is not what they provide. They provide mens catheters. She stated that in order for him to qualify they will need office notes that shows he has one of the following:  Allergy to latex UTI's Disorder of penis  If not dr needs to request to send in addendum.

## 2021-11-09 ENCOUNTER — Telehealth: Payer: Medicare HMO

## 2021-11-14 NOTE — Telephone Encounter (Signed)
Shirlean Mylar called back today and stated they received the info from medical records but they were only given 1 document and needed more and made them aware pt has been seeing [facility] since 2016, so they should defer to them for more info

## 2021-11-15 ENCOUNTER — Ambulatory Visit (INDEPENDENT_AMBULATORY_CARE_PROVIDER_SITE_OTHER): Payer: Medicare HMO | Admitting: Pharmacist

## 2021-11-15 DIAGNOSIS — I10 Essential (primary) hypertension: Secondary | ICD-10-CM

## 2021-11-15 DIAGNOSIS — N400 Enlarged prostate without lower urinary tract symptoms: Secondary | ICD-10-CM

## 2021-11-15 DIAGNOSIS — R739 Hyperglycemia, unspecified: Secondary | ICD-10-CM

## 2021-11-15 DIAGNOSIS — E785 Hyperlipidemia, unspecified: Secondary | ICD-10-CM

## 2021-11-15 DIAGNOSIS — N3941 Urge incontinence: Secondary | ICD-10-CM

## 2021-11-15 NOTE — Patient Instructions (Signed)
Mr. Loiseau, It was a pleasure speaking with you today.  I have attached a summary of our visit today and information about your health goals.  If you have any questions or concerns, please feel free to contact me either at the phone number below or with a MyChart message.   Keep up the good work!  Cherre Robins, PharmD Clinical Pharmacist [facility] [phone] (direct line)  [phone] (main office number)   No further follow up required - patient is doing well and no medication issues.    The patient verbalized understanding of instructions, educational materials, and care plan provided today and declined offer to receive copy of patient instructions, educational materials, and care plan.

## 2021-11-15 NOTE — Chronic Care Management (AMB) (Addendum)
Chronic Care Management Pharmacy Note  11/15/2021 Name:  Donald Porter MRN:  [id number] DOB:  1941-07-22  Subjective: Donald Porter is an 80 y.o. year old male who is a primary patient of [name], [name], [place].  The CCM team was consulted for assistance with disease management and care coordination needs.    Engaged with patient by telephone for follow up visit in response to provider referral for pharmacy case management and/or care coordination services.   Consent to Services:  The patient was given information about Chronic Care Management services, agreed to services, and gave verbal consent prior to initiation of services.  Please see initial visit note for detailed documentation.   Patient Care Team: [name], Iris Pert as PCP - General (Physician Assistant) Cherre Robins, [place] (Pharmacist)  Recent office visits: 10/30/2021 - Fam Med ([place], [facility]) F/u chronic conditions.  08/20/2021 - Fam Med (Dr Nani Ravens) Video Visit. COVID positive. Prescribed molnupiravir 87m twice a day for 5 days; benzonatate 1051m3 times a day as needed for cough 03/15/2021 - [place] ([name], [place] chronic conditions; no medication changes.   Recent consult visits: 07/03/2021 - Dermatology (Dr EdOrbie Hurst05/19/2022 - urology (Dr BeGloriann LoanSee for f/u prostate cancer, BPH and nocturia. Started oxybutynin 44m62mt bedtime   Hospital visits: None in previous 6 months  Objective:  Lab Results  Component Value Date   CREATININE 1.03 10/30/2021   CREATININE 0.97 03/15/2021   CREATININE 1.09 12/06/2020    Lab Results  Component Value Date   HGBA1C 5.8 10/30/2021   Last diabetic Eye exam: No results found for: HMDIABEYEEXA  Last diabetic Foot exam: No results found for: HMDIABFOOTEX      Component Value Date/Time   CHOL 152 10/30/2021 1206   TRIG 62.0 10/30/2021 1206   HDL 72.30 10/30/2021 1206   CHOLHDL 2 10/30/2021 1206   VLDL 12.4 10/30/2021 1206   LDLCALC 67 10/30/2021 1206    LDLCALC 85 09/06/2020 1000    Hepatic Function Latest Ref Rng & Units 10/30/2021 03/15/2021 12/06/2020  Total Protein 6.0 - 8.3 g/dL 7.0 7.0 7.1  Albumin 3.5 - 5.2 g/dL 4.0 3.9 4.2  AST 0 - 37 U/L 20 18 20   ALT 0 - 53 U/L 15 15 16   Alk Phosphatase 39 - 117 U/L 49 50 47  Total Bilirubin 0.2 - 1.2 mg/dL 1.0 0.7 0.7    Lab Results  Component Value Date/Time   TSH 2.47 09/06/2020 10:00 AM   FREET4 1.2 09/06/2020 10:00 AM    CBC Latest Ref Rng & Units 09/06/2020 10/30/2016 11/09/2015  WBC 3.8 - 10.8 Thousand/uL 6.9 8.5 9.9  Hemoglobin 13.2 - 17.1 g/dL 13.9 14.3 14.8  Hematocrit 38.5 - 50.0 % 41.1 42.2 43.8  Platelets 140 - 400 Thousand/uL 234 249.0 277.0    Lab Results  Component Value Date/Time   VD25OH 34 09/06/2020 10:00 AM    Clinical ASCVD: No  The ASCVD Risk score (Arnett DK, et al., 2019) failed to calculate for the following reasons:   The 2019 ASCVD risk score is only valid for ages 40 58 79 51 Other: (CHADS2VASc if Afib, PHQ9 if depression, MMRC or CAT for COPD, ACT, DEXA)  Social History   Tobacco Use  Smoking Status Never  Smokeless Tobacco Never   BP Readings from Last 3 Encounters:  10/30/21 116/60  03/15/21 129/61  12/06/20 136/68   Pulse Readings from Last 3 Encounters:  10/30/21 76  03/15/21 72  12/06/20 88  Wt Readings from Last 3 Encounters:  10/30/21 170 lb 6.4 oz (77.3 kg)  06/14/21 182 lb (82.6 kg)  03/15/21 182 lb 12.8 oz (82.9 kg)    Assessment: Review of patient past medical history, allergies, medications, health status, including review of consultants reports, laboratory and other test data, was performed as part of comprehensive evaluation and provision of chronic care management services.   SDOH:  (Social Determinants of Health) assessments and interventions performed:  SDOH Interventions    Flowsheet Row Most Recent Value  SDOH Interventions   Financial Strain Interventions Intervention Not Indicated  Social Connections  Interventions Intervention Not Indicated       CCM Care Plan  No Known Allergies  Medications Reviewed Today     Reviewed by Cherre Robins, RPH-CPP (Pharmacist) on 11/15/21 at 1453  Med List Status: <None>   Medication Order Taking? Sig Documenting Provider Last Dose Status Informant  albuterol (PROAIR HFA) 108 (90 Base) MCG/ACT inhaler [id number] Yes TAKE 2 PUFFS BY MOUTH EVERY 6 HOURS AS NEEDED FOR WHEEZE OR SHORTNESS OF BREATH [name], Iris Pert Taking Active   amLODipine (NORVASC) 5 MG tablet [id number] Yes TAKE 1 TABLET (5 MG TOTAL) BY MOUTH DAILY. [name], [name], PA-C Taking Active   lisinopril (ZESTRIL) 10 MG tablet [id number] Yes TAKE 2 TABLETS BY MOUTH EVERY DAY [name], [name], PA-C Taking Active   Multiple Vitamins-Minerals (MENS MULTIVITAMIN PLUS) TABS [id number] Yes Take by mouth daily. [provider] Taking Active   simvastatin (ZOCOR) 20 MG tablet [id number] Yes TAKE 1 TABLET BY MOUTH EVERYDAY AT BEDTIME [name], [name], PA-C Taking Active   tamsulosin (FLOMAX) 0.4 MG CAPS capsule [id number] Yes Take 1 capsule (0.4 mg total) by mouth 2 (two) times daily. Mackie Pai, PA-C Taking Active             Patient Active Problem List   Diagnosis Date Noted   Malignant neoplasm of prostate ([place]) 05/02/2020   Nuclear sclerotic cataract of left eye 08/26/2017   Hyperlipidemia 04/26/2015   Acute upper respiratory infection 11/22/2014   Allergic rhinitis 11/08/2014   Asthma due to seasonal allergies 11/08/2014   GERD (gastroesophageal reflux disease) 11/08/2014   Essential hypertension 11/08/2014    Immunization History  Administered Date(s) Administered   Fluad Quad(high Dose 65+) 09/20/2019, 12/06/2020, 10/30/2021   Influenza, High Dose Seasonal PF 10/17/2017, 10/07/2018   Influenza,inj,Quad PF,6+ Mos 11/09/2015   Influenza-Unspecified 09/18/2016   PFIZER(Purple Top)SARS-COV-2 Vaccination 01/08/2020, 01/29/2020, 10/11/2020   Pneumococcal Conjugate-13  02/05/2017   Pneumococcal Polysaccharide-23 12/07/2015   Tdap 07/10/2016   Zoster, Live 11/09/2015    Conditions to be addressed/monitored: HTN, HLD and BPH; h/o prostate cancer; asthma related to allergies; pre DM  Care Plan : General Pharmacy (Adult)  Updates made by Cherre Robins, RPH-CPP since 11/15/2021 12:00 AM     Problem: [place] Disease Management support, education, and care coordination needs related to Asthma (associated with allergies), HTN, Pre-DM, HLD, Allergies, BPH; history of prostate cancer.   Priority: Medium  Onset Date: 05/09/2021  Note:   Current Barriers:  Chronic Disease Management support, education, and care coordination needs related to Asthma (associated with allergies), HTN, Pre-DM, HLD, Allergies, BPH; history of prostate cancer.  Pharmacist Clinical Goal(s):  Over the next 180 days, patient will maintain control of BP, lipids and BG as evidenced by maintaining goals below;  through collaboration with PharmD and provider.   Interventions: 1:1 collaboration with [name], [name], PA-C regarding development and update of comprehensive plan of care as evidenced by provider attestation  and co-signature Inter-disciplinary care team collaboration (see longitudinal plan of care) Comprehensive medication review performed; medication list updated in electronic medical record   Hypertension Controlled; BP goal <140/90 Current regimen:  Amlodipine 28m daily Lisinopril 19m- take 2 tablets = 2028maily  Pharmacist interventions: Recommend maintain current hypertension medication regimen.   Hyperlipidemia At Goal of  LDL < 100 Current regimen:  Simvastatin 12m31mily at bedtime Pharmacist Interventions: Reviewed last lipid panel with patient Recommend maintain current cholesterol medication regimen.   Pre-Diabetes At goal of A1c <6.5% Current regimen:  Diet and exercise management   Pharmacist Interventions: Discussed A1c goals Continue current diet  of limiting sugar intake Continue to exercise - goal is to get at least 150 minutes of physical activity per week.  Asthma associated with allergies:  Current regimen:  Albuterol inhaler as needed for shortness of breath Patient reports he rarely needs albuterol - once per month or less.  Reports he just got refills because previous inhaler had expired before he used all 200 doses Pharmacist Interventions:  Continue to use albuterol as needed.    BPH / history of prostate cancer:  Treated for prostate cancer in 2021 Has follow up with urologist tomorrow 11/15/2021 Patient reports that he stopped oxybutynin due to side effect of mouth dryness Current regimen:  Tamsulosin 0.4mg 76mce a day Interventions: Discussed symptoms of BPH  Recommended he discuss with urologist at appointment tomorrow 11/16/2021 possible trial of Myrbetriq for urinary frequency  Medication management [facility] DS[facility] all medications Interventions Comprehensive medication review performed. Reviewed recent refill history. Adherence is excellent based on fill history.  Continue current medication management strategy  Patient Goals/Self-Care Activities Over the next 180 days, patient will:  take medications as prescribed and target a minimum of 150 minutes of moderate intensity exercise weekly  Follow Up Plan: Telephone follow up appointment with care management team member scheduled for:  6 months     SH: [place] works part time as CPA (Engineer, maintenance (IT)tly during tax season)  Medication Assistance: None required.  Patient affirms current coverage meets needs.  Patient's preferred pharmacy is:  [id number] [place] [address] [address]: [phone] Up:  Patient agrees to Care Plan and Follow-up.  Plan: Telephone follow up appointment with care  management team member scheduled for:  6 months  Akaash Vandewater Cherre RobinsmD Clinical Pharmacist [facility] personally reviewed this encounter including the documentation in this note and have collaborated with the care management provider regarding care management and care coordination activities to include development and update of the comprehensive care plan. I am certifying that I agree with the content of this note and encounter as supervising physician.    EdwardMackie Pai

## 2021-11-16 DIAGNOSIS — C61 Malignant neoplasm of prostate: Secondary | ICD-10-CM | POA: Diagnosis not present

## 2021-11-16 DIAGNOSIS — R351 Nocturia: Secondary | ICD-10-CM | POA: Diagnosis not present

## 2021-11-28 DIAGNOSIS — E785 Hyperlipidemia, unspecified: Secondary | ICD-10-CM | POA: Diagnosis not present

## 2021-11-28 DIAGNOSIS — I10 Essential (primary) hypertension: Secondary | ICD-10-CM | POA: Diagnosis not present

## 2021-11-28 DIAGNOSIS — N400 Enlarged prostate without lower urinary tract symptoms: Secondary | ICD-10-CM

## 2021-12-02 ENCOUNTER — Other Ambulatory Visit: Payer: Self-pay | Admitting: Medical

## 2021-12-05 ENCOUNTER — Telehealth: Payer: Self-pay | Admitting: Medical

## 2021-12-05 NOTE — Telephone Encounter (Signed)
[facility] called stating they need a secondary urological diagnostics such as hidden penis/retraction, body habitus , morbid obesity, penile trauma/disorder of the penis. In order for the patient to qualify for the specialty catheter that was requested for him. They can be reached at [phone], option 3 for urology.

## 2021-12-05 NOTE — Telephone Encounter (Signed)
Does pt have any of the following I dont see it under problem list or in the last OV

## 2021-12-06 ENCOUNTER — Other Ambulatory Visit: Payer: Self-pay | Admitting: Medical

## 2021-12-06 NOTE — Telephone Encounter (Signed)
Spoke with agency told them to reach back out to urology

## 2021-12-12 DIAGNOSIS — R32 Unspecified urinary incontinence: Secondary | ICD-10-CM | POA: Diagnosis not present

## 2021-12-12 DIAGNOSIS — N3941 Urge incontinence: Secondary | ICD-10-CM | POA: Diagnosis not present

## 2021-12-12 DIAGNOSIS — N401 Enlarged prostate with lower urinary tract symptoms: Secondary | ICD-10-CM | POA: Diagnosis not present

## 2022-02-21 DIAGNOSIS — L820 Inflamed seborrheic keratosis: Secondary | ICD-10-CM | POA: Diagnosis not present

## 2022-02-21 DIAGNOSIS — D485 Neoplasm of uncertain behavior of skin: Secondary | ICD-10-CM | POA: Diagnosis not present

## 2022-02-21 DIAGNOSIS — L821 Other seborrheic keratosis: Secondary | ICD-10-CM | POA: Diagnosis not present

## 2022-02-21 DIAGNOSIS — Z85828 Personal history of other malignant neoplasm of skin: Secondary | ICD-10-CM | POA: Diagnosis not present

## 2022-02-21 DIAGNOSIS — L570 Actinic keratosis: Secondary | ICD-10-CM | POA: Diagnosis not present

## 2022-03-05 ENCOUNTER — Other Ambulatory Visit: Payer: Self-pay | Admitting: Medical

## 2022-04-18 ENCOUNTER — Other Ambulatory Visit: Payer: Self-pay | Admitting: Medical

## 2022-04-30 DIAGNOSIS — C61 Malignant neoplasm of prostate: Secondary | ICD-10-CM | POA: Diagnosis not present

## 2022-05-07 DIAGNOSIS — R351 Nocturia: Secondary | ICD-10-CM | POA: Diagnosis not present

## 2022-05-07 DIAGNOSIS — C61 Malignant neoplasm of prostate: Secondary | ICD-10-CM | POA: Diagnosis not present

## 2022-05-07 DIAGNOSIS — N401 Enlarged prostate with lower urinary tract symptoms: Secondary | ICD-10-CM | POA: Diagnosis not present

## 2022-06-17 NOTE — Progress Notes (Unsigned)
Subjective:   Donald Porter is a 81 y.o. male who presents for Medicare Annual/Subsequent preventive examination.  Review of Systems           Objective:    There were no vitals filed for this visit. There is no height or weight on file to calculate BMI.     06/14/2021    9:23 AM 06/09/2020    9:46 AM 05/02/2020    8:50 AM 06/04/2019   10:16 AM 06/01/2018   10:11 AM 11/26/2014   10:26 AM  Advanced Directives  Does Patient Have a Medical Advance Directive? Yes Yes Yes Yes Yes No  Type of Paramedic of [place];Living will [place];Living will Living will;Healthcare Power of Attorney Living will;Healthcare Power of [place];Living will   Does patient want to make changes to medical advance directive?   No - Patient declined No - Patient declined No - Patient declined   Copy of [place] in Chart? No - copy requested No - copy requested No - copy requested No - copy requested No - copy requested   Would patient like information on creating a medical advance directive?      No - patient declined information    Current Medications (verified) Outpatient Encounter Medications as of 06/18/2022  Medication Sig   albuterol (PROAIR HFA) 108 (90 Base) MCG/ACT inhaler TAKE 2 PUFFS BY MOUTH EVERY 6 HOURS AS NEEDED FOR WHEEZE OR SHORTNESS OF BREATH   amLODipine (NORVASC) 5 MG tablet TAKE 1 TABLET (5 MG TOTAL) BY MOUTH DAILY.   lisinopril (ZESTRIL) 10 MG tablet TAKE 2 TABLETS BY MOUTH EVERY DAY   Multiple Vitamins-Minerals (MENS MULTIVITAMIN PLUS) TABS Take by mouth daily.   simvastatin (ZOCOR) 20 MG tablet TAKE 1 TABLET BY MOUTH EVERYDAY AT BEDTIME   tamsulosin (FLOMAX) 0.4 MG CAPS capsule TAKE 1 CAPSULE BY MOUTH 2 TIMES DAILY.   No facility-administered encounter medications on file as of 06/18/2022.    Allergies (verified) Patient has no known allergies.   History: Past Medical History:  Diagnosis  Date   Allergy    Asthma    1988   GERD (gastroesophageal reflux disease)    Hyperlipidemia 04/26/2015   Hypertension    Malignant neoplasm of prostate ([place]) 05/02/2020   Nuclear sclerotic cataract of left eye 08/26/2017   Prostate cancer [facility])    Past Surgical History:  Procedure Laterality Date   EYE SURGERY     Age 1   HERNIA REPAIR     3 surgeries   PROSTATE BIOPSY     Family History  Problem Relation Age of Onset   Heart disease Mother    Lymphoma Father    Cancer Father    Lung cancer Maternal Uncle    Lung cancer Maternal Uncle    Breast cancer Neg Hx    Colon cancer Neg Hx    Pancreatic cancer Neg Hx    Prostate cancer Neg Hx    Social History   Socioeconomic History   Marital status: Widowed    Spouse name: Not on file   Number of children: 2   Years of education: Not on file   Highest education level: Not on file  Occupational History   Occupation: accountant    Comment: works part time  Tobacco Use   Smoking status: Never   Smokeless tobacco: Never  Vaping Use   Vaping Use: Never used  Substance and Sexual Activity  Alcohol use: Yes    Alcohol/week: 2.0 standard drinks of alcohol    Types: 2 Glasses of wine per week    Comment: 1-2 glasses of wine 4-5 days per week   Drug use: Never   Sexual activity: Not Currently    Comment: widowed in 2018 after 64 years of marriage.  Other Topics Concern   Not on file  Social History Narrative   One living child. Lost his son at 91 to kidney disease. Daughter lives in [place].   Social Determinants of Health   Financial Resource Strain: Low Risk  (11/15/2021)   Overall Financial Resource Strain (CARDIA)    Difficulty of Paying Living Expenses: Not hard at all  Food Insecurity: No Food Insecurity (06/14/2021)   Hunger Vital Sign    Worried About Running Out of Food in the Last Year: Never true    Ran Out of Food in the Last Year: Never true  Transportation Needs: Unmet Transportation Needs (06/14/2021)    PRAPARE - Hydrologist (Medical): Yes    Lack of Transportation (Non-Medical): Yes  Physical Activity: Insufficiently Active (06/14/2021)   Exercise Vital Sign    Days of Exercise per Week: 4 days    Minutes of Exercise per Session: 30 min  Stress: No Stress Concern Present (06/14/2021)   [place]    Feeling of Stress : Not at all  Social Connections: Moderately Isolated (11/15/2021)   Social Connection and Isolation Panel [NHANES]    Frequency of Communication with Friends and Family: More than three times a week    Frequency of Social Gatherings with Friends and Family: More than three times a week    Attends Religious Services: 1 to 4 times per year    Active Member of [employer] or Organizations: No    Attends Archivist Meetings: Never    Marital Status: Widowed    Tobacco Counseling Counseling given: Not Answered   Clinical Intake:                 Diabetic?No         Activities of Daily Living     No data to display          Patient Care Team: [name], Iris Pert as PCP - General (Physician Assistant) Cherre Robins, [place] (Pharmacist)  Indicate any recent Medical Services you may have received from other than [facility] providers in the past year (date may be approximate).     Assessment:   This is a routine wellness examination for Donald Porter.  Hearing/Vision screen No results found.  Dietary issues and exercise activities discussed:     Goals Addressed   None    Depression Screen    06/14/2021    9:13 AM 06/09/2020    9:44 AM 06/04/2019   10:16 AM 06/01/2018   10:16 AM 02/05/2017   10:03 AM 11/09/2015    8:41 AM 11/08/2014    9:11 AM  PHQ 2/9 Scores  PHQ - 2 Score 1 0 0 0 0 0 0    Fall Risk    06/14/2021    9:13 AM 03/15/2021    8:35 AM 06/09/2020    9:41 AM 06/04/2019   10:16 AM 06/01/2018   10:16 AM  Fall Risk   Falls in the past year? 0 0   0 No  Number falls in past yr: 0 0 0    Injury with Fall? 0 0  0    Risk for fall due to :  No Fall Risks No Fall Risks    Follow up Falls prevention discussed Falls evaluation completed       FALL RISK PREVENTION PERTAINING TO THE HOME:  Any stairs in or around the home? {YES/NO:21197} If so, are there any without handrails? {YES/NO:21197} Home free of loose throw rugs in walkways, pet beds, electrical cords, etc? {YES/NO:21197} Adequate lighting in your home to reduce risk of falls? {YES/NO:21197}  ASSISTIVE DEVICES UTILIZED TO PREVENT FALLS:  Life alert? {YES/NO:21197} Use of a cane, walker or w/c? {YES/NO:21197} Grab bars in the bathroom? {YES/NO:21197} Shower chair or bench in shower? {YES/NO:21197} Elevated toilet seat or a handicapped toilet? {YES/NO:21197}  TIMED UP AND GO:  Was the test performed? {YES/NO:21197}.  Length of time to ambulate 10 feet: *** sec.   {Appearance of FTDD:2202542}  Cognitive Function:        Immunizations Immunization History  Administered Date(s) Administered   Fluad Quad(high Dose 65+) 09/20/2019, 12/06/2020, 10/30/2021   Influenza, High Dose Seasonal PF 10/17/2017, 10/07/2018   Influenza,inj,Quad PF,6+ Mos 11/09/2015   Influenza-Unspecified 09/18/2016   PFIZER(Purple Top)SARS-COV-2 Vaccination 01/08/2020, 01/29/2020, 10/11/2020   Pneumococcal Conjugate-13 02/05/2017   Pneumococcal Polysaccharide-23 12/07/2015   Tdap 07/10/2016   Zoster, Live 11/09/2015    TDAP status: Up to date  Flu Vaccine status: Up to date  Pneumococcal vaccine status: Up to date  {Covid-19 vaccine status:2101808}  Qualifies for Shingles Vaccine? {YES/NO:21197}  Zostavax completed {YES/NO:21197}  {Shingrix Completed?:2101804}  Screening Tests Health Maintenance  Topic Date Due   COVID-19 Vaccine (4 - Booster for Pfizer series) 12/06/2020   INFLUENZA VACCINE  07/30/2022   TETANUS/TDAP  07/10/2026   Pneumonia Vaccine 39+ Years old  Completed    Zoster Vaccines- Shingrix  Completed   HPV VACCINES  Aged Out    Health Maintenance  Health Maintenance Due  Topic Date Due   COVID-19 Vaccine (4 - Booster for [place] series) 12/06/2020    Colorectal cancer screening: No longer required.   Lung Cancer Screening: (Low Dose CT Chest recommended if Age 64-80 years, 30 pack-year currently smoking OR have quit w/in 15years.) does not qualify.   Lung Cancer Screening Referral: N/A  Additional Screening:  Hepatitis C Screening: does not qualify; Completed aged out  Vision Screening: Recommended annual ophthalmology exams for early detection of glaucoma and other disorders of the eye. Is the patient up to date with their annual eye exam?  {YES/NO:21197} Who is the provider or what is the name of the office in which the patient attends annual eye exams? *** If pt is not established with a provider, would they like to be referred to a provider to establish care? {YES/NO:21197}.   Dental Screening: Recommended annual dental exams for proper oral hygiene  Community Resource Referral / Chronic Care Management: CRR required this visit?  {YES/NO:21197}  CCM required this visit?  {YES/NO:21197}     Plan:     I have personally reviewed and noted the following in the patient's chart:   Medical and social history Use of alcohol, tobacco or illicit drugs  Current medications and supplements including opioid prescriptions. {Opioid [phone]} Functional ability and status Nutritional status Physical activity Advanced directives List of other physicians Hospitalizations, surgeries, and ER visits in previous 12 months Vitals Screenings to include cognitive, depression, and falls Referrals and appointments  In addition, I have reviewed and discussed with patient certain preventive protocols, quality metrics, and best practice recommendations. A written personalized  care plan for preventive services as well as general  preventive health recommendations were provided to patient.     Duard Brady Eilah Common, [place]   06/17/2022   Nurse Notes: ***

## 2022-06-18 ENCOUNTER — Ambulatory Visit (INDEPENDENT_AMBULATORY_CARE_PROVIDER_SITE_OTHER): Payer: Medicare HMO

## 2022-06-18 DIAGNOSIS — Z0000 Encounter for general adult medical examination without abnormal findings: Secondary | ICD-10-CM | POA: Diagnosis not present

## 2022-06-18 NOTE — Patient Instructions (Signed)
[name] , Thank you for taking time to come for your Medicare Wellness Visit. I appreciate your ongoing commitment to your health goals. Please review the following plan we discussed and let me know if I can assist you in the future.   Screening recommendations/referrals: Colonoscopy: no longer needed Recommended yearly ophthalmology/optometry visit for glaucoma screening and checkup Recommended yearly dental visit for hygiene and checkup  Vaccinations: Influenza vaccine: up to date Pneumococcal vaccine: up to date Tdap vaccine: up to date Shingles vaccine: up to date   Covid-19: Due-May obtain vaccine at your local pharmacy.   Advanced directives: yes, not on file  Conditions/risks identified: see problem list   Next appointment: Follow up in one year for your annual wellness visit. 06/20/23  Preventive Care 83 Years and Older, Male Preventive care refers to lifestyle choices and visits with your health care provider that can promote health and wellness. What does preventive care include? A yearly physical exam. This is also called an annual well check. Dental exams once or twice a year. Routine eye exams. Ask your health care provider how often you should have your eyes checked. Personal lifestyle choices, including: Daily care of your teeth and gums. Regular physical activity. Eating a healthy diet. Avoiding tobacco and drug use. Limiting alcohol use. Practicing safe sex. Taking low doses of aspirin every day. Taking vitamin and mineral supplements as recommended by your health care provider. What happens during an annual well check? The services and screenings done by your health care provider during your annual well check will depend on your age, overall health, lifestyle risk factors, and family history of disease. Counseling  Your health care provider may ask you questions about your: Alcohol use. Tobacco use. Drug use. Emotional well-being. Home and relationship  well-being. Sexual activity. Eating habits. History of falls. Memory and ability to understand (cognition). Work and work Statistician. Screening  You may have the following tests or measurements: Height, weight, and BMI. Blood pressure. Lipid and cholesterol levels. These may be checked every 5 years, or more frequently if you are over 60 years old. Skin check. Lung cancer screening. You may have this screening every year starting at age 49 if you have a 30-pack-year history of smoking and currently smoke or have quit within the past 15 years. Fecal occult blood test (FOBT) of the stool. You may have this test every year starting at age 78. Flexible sigmoidoscopy or colonoscopy. You may have a sigmoidoscopy every 5 years or a colonoscopy every 10 years starting at age 22. Prostate cancer screening. Recommendations will vary depending on your family history and other risks. Hepatitis C blood test. Hepatitis B blood test. Sexually transmitted disease (STD) testing. Diabetes screening. This is done by checking your blood sugar (glucose) after you have not eaten for a while (fasting). You may have this done every 1-3 years. Abdominal aortic aneurysm (AAA) screening. You may need this if you are a current or former smoker. Osteoporosis. You may be screened starting at age 49 if you are at high risk. Talk with your health care provider about your test results, treatment options, and if necessary, the need for more tests. Vaccines  Your health care provider may recommend certain vaccines, such as: Influenza vaccine. This is recommended every year. Tetanus, diphtheria, and acellular pertussis (Tdap, Td) vaccine. You may need a Td booster every 10 years. Zoster vaccine. You may need this after age 52. Pneumococcal 13-valent conjugate (PCV13) vaccine. One dose is recommended after age 70.  Pneumococcal polysaccharide (PPSV23) vaccine. One dose is recommended after age 36. Talk to your health care  provider about which screenings and vaccines you need and how often you need them. This information is not intended to replace advice given to you by your health care provider. Make sure you discuss any questions you have with your health care provider. Document Released: 01/12/2016 Document Revised: 09/04/2016 Document Reviewed: 10/17/2015 Elsevier Interactive Patient Education  2017 [place] Prevention in the Home Falls can cause injuries. They can happen to people of all ages. There are many things you can do to make your home safe and to help prevent falls. What can I do on the outside of my home? Regularly fix the edges of walkways and driveways and fix any cracks. Remove anything that might make you trip as you walk through a door, such as a raised step or threshold. Trim any bushes or trees on the path to your home. Use bright outdoor lighting. Clear any walking paths of anything that might make someone trip, such as rocks or tools. Regularly check to see if handrails are loose or broken. Make sure that both sides of any steps have handrails. Any raised decks and porches should have guardrails on the edges. Have any leaves, snow, or ice cleared regularly. Use sand or salt on walking paths during winter. Clean up any spills in your garage right away. This includes oil or grease spills. What can I do in the bathroom? Use night lights. Install grab bars by the toilet and in the tub and shower. Do not use towel bars as grab bars. Use non-skid mats or decals in the tub or shower. If you need to sit down in the shower, use a plastic, non-slip stool. Keep the floor dry. Clean up any water that spills on the floor as soon as it happens. Remove soap buildup in the tub or shower regularly. Attach bath mats securely with double-sided non-slip rug tape. Do not have throw rugs and other things on the floor that can make you trip. What can I do in the bedroom? Use night lights. Make  sure that you have a light by your bed that is easy to reach. Do not use any sheets or blankets that are too big for your bed. They should not hang down onto the floor. Have a firm chair that has side arms. You can use this for support while you get dressed. Do not have throw rugs and other things on the floor that can make you trip. What can I do in the kitchen? Clean up any spills right away. Avoid walking on wet floors. Keep items that you use a lot in easy-to-reach places. If you need to reach something above you, use a strong step stool that has a grab bar. Keep electrical cords out of the way. Do not use floor polish or wax that makes floors slippery. If you must use wax, use non-skid floor wax. Do not have throw rugs and other things on the floor that can make you trip. What can I do with my stairs? Do not leave any items on the stairs. Make sure that there are handrails on both sides of the stairs and use them. Fix handrails that are broken or loose. Make sure that handrails are as long as the stairways. Check any carpeting to make sure that it is firmly attached to the stairs. Fix any carpet that is loose or worn. Avoid having throw rugs at the  top or bottom of the stairs. If you do have throw rugs, attach them to the floor with carpet tape. Make sure that you have a light switch at the top of the stairs and the bottom of the stairs. If you do not have them, ask someone to add them for you. What else can I do to help prevent falls? Wear shoes that: Do not have high heels. Have rubber bottoms. Are comfortable and fit you well. Are closed at the toe. Do not wear sandals. If you use a stepladder: Make sure that it is fully opened. Do not climb a closed stepladder. Make sure that both sides of the stepladder are locked into place. Ask someone to hold it for you, if possible. Clearly mark and make sure that you can see: Any grab bars or handrails. First and last steps. Where the  edge of each step is. Use tools that help you move around (mobility aids) if they are needed. These include: Canes. Walkers. Scooters. Crutches. Turn on the lights when you go into a dark area. Replace any light bulbs as soon as they burn out. Set up your furniture so you have a clear path. Avoid moving your furniture around. If any of your floors are uneven, fix them. If there are any pets around you, be aware of where they are. Review your medicines with your doctor. Some medicines can make you feel dizzy. This can increase your chance of falling. Ask your doctor what other things that you can do to help prevent falls. This information is not intended to replace advice given to you by your health care provider. Make sure you discuss any questions you have with your health care provider. Document Released: 10/12/2009 Document Revised: 05/23/2016 Document Reviewed: 01/20/2015 Elsevier Interactive Patient Education  2017 [employer].

## 2022-07-17 ENCOUNTER — Other Ambulatory Visit: Payer: Self-pay | Admitting: Medical

## 2022-07-24 ENCOUNTER — Other Ambulatory Visit: Payer: Self-pay | Admitting: Medical

## 2022-09-10 ENCOUNTER — Ambulatory Visit (INDEPENDENT_AMBULATORY_CARE_PROVIDER_SITE_OTHER): Payer: Medicare HMO | Admitting: Medical

## 2022-09-10 VITALS — BP 137/71 | HR 69 | Temp 97.6°F | Resp 18 | Ht 67.0 in | Wt 174.2 lb

## 2022-09-10 DIAGNOSIS — I10 Essential (primary) hypertension: Secondary | ICD-10-CM

## 2022-09-10 DIAGNOSIS — R739 Hyperglycemia, unspecified: Secondary | ICD-10-CM

## 2022-09-10 DIAGNOSIS — Z23 Encounter for immunization: Secondary | ICD-10-CM

## 2022-09-10 DIAGNOSIS — E785 Hyperlipidemia, unspecified: Secondary | ICD-10-CM

## 2022-09-10 LAB — COMPREHENSIVE METABOLIC PANEL
ALT: 17 U/L (ref 0–53)
AST: 21 U/L (ref 0–37)
Albumin: 4.2 g/dL (ref 3.5–5.2)
Alkaline Phosphatase: 46 U/L (ref 39–117)
BUN: 18 mg/dL (ref 6–23)
CO2: 28 mEq/L (ref 19–32)
Calcium: 9.6 mg/dL (ref 8.4–10.5)
Chloride: 96 mEq/L (ref 96–112)
Creatinine, Ser: 0.9 mg/dL (ref 0.40–1.50)
GFR: 79.3 mL/min (ref >60.00)
Glucose, Bld: 85 mg/dL (ref 70–99)
Potassium: 4.6 mEq/L (ref 3.5–5.1)
Sodium: 132 mEq/L — ABNORMAL LOW (ref 135–145)
Total Bilirubin: 0.9 mg/dL (ref 0.2–1.2)
Total Protein: 7.5 g/dL (ref 6.0–8.3)

## 2022-09-10 LAB — HEMOGLOBIN A1C: Hgb A1c MFr Bld: 5.9 % (ref 4.6–6.5)

## 2022-09-10 LAB — LIPID PANEL
Cholesterol: 159 mg/dL (ref 0–200)
HDL: 75.9 mg/dL (ref >39.00)
LDL Cholesterol: 72 mg/dL (ref 0–99)
NonHDL: 82.94
Total CHOL/HDL Ratio: 2
Triglycerides: 56 mg/dL (ref 0.0–149.0)
VLDL: 11.2 mg/dL (ref 0.0–40.0)

## 2022-09-10 NOTE — Patient Instructions (Addendum)
Hypertension- bp is well controlled. Continue amlodipine 5 mg daily and lisinopril 10 mg 2 tab daily.  Hyperlipidemia- continue simvastatin. Lipid panel today.  For bph urinary type symptoms continue flomax 2 tab po q day.   For elevated sugar will get cmp and a1c today.  Albuterol available to use if needed.  Follow up date 3-6 months. Exact date to be determined after lab review.

## 2022-09-10 NOTE — Progress Notes (Signed)
Subjective:    Patient ID: Donald Porter, male    DOB: 05-Oct-1941, 81 y.o.   MRN: [id number]  HPI Pt in for follow up.  Pt states got update on last urologist visit that he is prostate cancer free.  Pt got flu vaccine today.  Htn- bp well controlled.  Pt is walking until it got too hot this summer. Restarted as whether got better. Walks about mile 5 days a week. Pt lost 6 lbs. He is avoiding fating food. Cooks for himself. Lives alone. He was wanting to loose some weight.  Hyperlipidemia- on simvastatin.   Pt on flomax for urinary symptoms. Pt urinary symptoms mostly at night.  No recent wheezing. No recent use of inhaler. Sometimes needs to use in fall.     Review of Systems  Constitutional:  Negative for chills, fatigue and fever.  HENT:  Negative for congestion, drooling, facial swelling and hearing loss.   Respiratory:  Negative for cough, chest tightness, shortness of breath and wheezing.   Cardiovascular:  Negative for chest pain and palpitations.  Gastrointestinal:  Negative for abdominal pain, anal bleeding, constipation and nausea.  Genitourinary:  Negative for dysuria, frequency and penile pain.  Musculoskeletal:  Negative for back pain, joint swelling and myalgias.  Skin:  Negative for rash.  Neurological:  Negative for dizziness, numbness and headaches.  Psychiatric/Behavioral:  Negative for behavioral problems, confusion, decreased concentration and dysphoric mood.     Past Medical History:  Diagnosis Date   Allergy    Asthma    1988   GERD (gastroesophageal reflux disease)    Hyperlipidemia 04/26/2015   Hypertension    Malignant neoplasm of prostate ([facility]) 05/02/2020   Nuclear sclerotic cataract of left eye 08/26/2017   Prostate cancer [facility])      Social History   Socioeconomic History   Marital status: Widowed    Spouse name: Not on file   Number of children: 2   Years of education: Not on file   Highest education level: Not on file  Occupational  History   Occupation: accountant    Comment: works part time  Tobacco Use   Smoking status: Never   Smokeless tobacco: Never  Vaping Use   Vaping Use: Never used  Substance and Sexual Activity   Alcohol use: Yes    Alcohol/week: 2.0 standard drinks of alcohol    Types: 2 Glasses of wine per week    Comment: 1-2 glasses of wine 4-5 days per week   Drug use: Never   Sexual activity: Not Currently    Comment: widowed in 2018 after 40 years of marriage.  Other Topics Concern   Not on file  Social History Narrative   One living child. Lost his son at 1 to kidney disease. Daughter lives in [place].   Social Determinants of Health   Financial Resource Strain: Low Risk  (11/15/2021)   Overall Financial Resource Strain (CARDIA)    Difficulty of Paying Living Expenses: Not hard at all  Food Insecurity: No Food Insecurity (06/14/2021)   Hunger Vital Sign    Worried About Running Out of Food in the Last Year: Never true    Ran Out of Food in the Last Year: Never true  Transportation Needs: Unmet Transportation Needs (06/14/2021)   PRAPARE - Transportation    Lack of Transportation (Medical): Yes    Lack of Transportation (Non-Medical): Yes  Physical Activity: Insufficiently Active (06/14/2021)   Exercise Vital Sign    Days of Exercise per  Week: 4 days    Minutes of Exercise per Session: 30 min  Stress: No Stress Concern Present (06/14/2021)   [place]    Feeling of Stress : Not at all  Social Connections: Moderately Isolated (11/15/2021)   Social Connection and Isolation Panel [NHANES]    Frequency of Communication with Friends and Family: More than three times a week    Frequency of Social Gatherings with Friends and Family: More than three times a week    Attends Religious Services: 1 to 4 times per year    Active Member of [employer] or Organizations: No    Attends Archivist Meetings: Never    Marital  Status: Widowed  Intimate Partner Violence: Not At Risk (06/14/2021)   Humiliation, Afraid, Rape, and Kick questionnaire    Fear of Current or Ex-Partner: No    Emotionally Abused: No    Physically Abused: No    Sexually Abused: No    Past Surgical History:  Procedure Laterality Date   EYE SURGERY     Age 69   HERNIA REPAIR     3 surgeries   PROSTATE BIOPSY      Family History  Problem Relation Age of Onset   Heart disease Mother    Lymphoma Father    Cancer Father    Lung cancer Maternal Uncle    Lung cancer Maternal Uncle    Breast cancer Neg Hx    Colon cancer Neg Hx    Pancreatic cancer Neg Hx    Prostate cancer Neg Hx     No Known Allergies  Current Outpatient Medications on File Prior to Visit  Medication Sig Dispense Refill   albuterol (PROAIR HFA) 108 (90 Base) MCG/ACT inhaler TAKE 2 PUFFS BY MOUTH EVERY 6 HOURS AS NEEDED FOR WHEEZE OR SHORTNESS OF BREATH 90 g 2   amLODipine (NORVASC) 5 MG tablet TAKE 1 TABLET (5 MG TOTAL) BY MOUTH DAILY. 90 tablet 0   lisinopril (ZESTRIL) 10 MG tablet TAKE 2 TABLETS BY MOUTH EVERY DAY 180 tablet 1   Multiple Vitamins-Minerals (MENS MULTIVITAMIN PLUS) TABS Take by mouth daily.     simvastatin (ZOCOR) 20 MG tablet TAKE 1 TABLET BY MOUTH EVERYDAY AT BEDTIME 90 tablet 1   tamsulosin (FLOMAX) 0.4 MG CAPS capsule TAKE 1 CAPSULE BY MOUTH 2 TIMES DAILY. 180 capsule 3   No current facility-administered medications on file prior to visit.    BP 137/71 (BP Location: Left Arm, Patient Position: Sitting, Cuff Size: Normal)   Pulse 69   Temp 97.6 F (36.4 C) (Temporal)   Resp 18   Ht '5\' 7"'$  (1.702 m)   Wt 174 lb 3.2 oz (79 kg)   SpO2 98%   BMI 27.28 kg/m        Objective:   Physical Exam  General Mental Status- Alert. General Appearance- Not in acute distress.   Skin General: Color- Normal Color. Moisture- Normal Moisture.  Neck Carotid Arteries- Normal color. Moisture- Normal Moisture. No carotid bruits. No JVD.  Chest  and Lung Exam Auscultation: Breath Sounds:-Normal.  Cardiovascular Auscultation:Rythm- Regular. Murmurs & Other Heart Sounds:Auscultation of the heart reveals- No Murmurs.  Neurologic Cranial Nerve exam:- CN III-XII intact(No nystagmus), symmetric smile. Strength:- 5/5 equal and symmetric strength both upper and lower extremities.       Assessment & Plan:   Patient Instructions  Hypertension- bp is well controlled. Continue amlodipine 5 mg daily and lisinopril 10 mg 2 tab  daily.  Hyperlipidemia- continue simvastatin. Lipid panel today.  For bph urinary type symptoms continue flomax 2 tab po q day.   For elevated sugar will get cmp and a1c today.  Albuterol available to use if needed.  Follow up date 3-6 months. Exact date to be determined after lab review.     Mackie Pai, PA-C

## 2022-09-11 DIAGNOSIS — Z0184 Encounter for antibody response examination: Secondary | ICD-10-CM | POA: Diagnosis not present

## 2022-10-10 ENCOUNTER — Other Ambulatory Visit: Payer: Self-pay | Admitting: Medical

## 2022-10-15 DIAGNOSIS — L820 Inflamed seborrheic keratosis: Secondary | ICD-10-CM | POA: Diagnosis not present

## 2022-10-15 DIAGNOSIS — L570 Actinic keratosis: Secondary | ICD-10-CM | POA: Diagnosis not present

## 2022-10-15 DIAGNOSIS — E663 Overweight: Secondary | ICD-10-CM | POA: Diagnosis not present

## 2022-10-15 DIAGNOSIS — Z129 Encounter for screening for malignant neoplasm, site unspecified: Secondary | ICD-10-CM | POA: Diagnosis not present

## 2022-10-15 DIAGNOSIS — C44319 Basal cell carcinoma of skin of other parts of face: Secondary | ICD-10-CM | POA: Diagnosis not present

## 2022-10-15 DIAGNOSIS — L821 Other seborrheic keratosis: Secondary | ICD-10-CM | POA: Diagnosis not present

## 2022-10-15 DIAGNOSIS — Z85828 Personal history of other malignant neoplasm of skin: Secondary | ICD-10-CM | POA: Diagnosis not present

## 2022-10-29 DIAGNOSIS — C61 Malignant neoplasm of prostate: Secondary | ICD-10-CM | POA: Diagnosis not present

## 2022-10-31 DIAGNOSIS — C44319 Basal cell carcinoma of skin of other parts of face: Secondary | ICD-10-CM | POA: Diagnosis not present

## 2022-11-05 DIAGNOSIS — N401 Enlarged prostate with lower urinary tract symptoms: Secondary | ICD-10-CM | POA: Diagnosis not present

## 2022-11-05 DIAGNOSIS — C61 Malignant neoplasm of prostate: Secondary | ICD-10-CM | POA: Diagnosis not present

## 2022-11-05 DIAGNOSIS — R351 Nocturia: Secondary | ICD-10-CM | POA: Diagnosis not present

## 2022-11-07 DIAGNOSIS — C44319 Basal cell carcinoma of skin of other parts of face: Secondary | ICD-10-CM | POA: Diagnosis not present

## 2022-12-02 ENCOUNTER — Other Ambulatory Visit: Payer: Self-pay | Admitting: Medical

## 2023-01-22 ENCOUNTER — Other Ambulatory Visit: Payer: Self-pay | Admitting: Medical

## 2023-03-01 ENCOUNTER — Other Ambulatory Visit: Payer: Self-pay | Admitting: Medical

## 2023-03-11 ENCOUNTER — Ambulatory Visit: Payer: Medicare HMO | Admitting: Medical

## 2023-04-07 ENCOUNTER — Other Ambulatory Visit: Payer: Self-pay | Admitting: Medical

## 2023-04-15 DIAGNOSIS — L570 Actinic keratosis: Secondary | ICD-10-CM | POA: Diagnosis not present

## 2023-04-15 DIAGNOSIS — D1801 Hemangioma of skin and subcutaneous tissue: Secondary | ICD-10-CM | POA: Diagnosis not present

## 2023-04-15 DIAGNOSIS — L821 Other seborrheic keratosis: Secondary | ICD-10-CM | POA: Diagnosis not present

## 2023-04-15 DIAGNOSIS — Z85828 Personal history of other malignant neoplasm of skin: Secondary | ICD-10-CM | POA: Diagnosis not present

## 2023-04-15 DIAGNOSIS — L820 Inflamed seborrheic keratosis: Secondary | ICD-10-CM | POA: Diagnosis not present

## 2023-04-15 DIAGNOSIS — Z129 Encounter for screening for malignant neoplasm, site unspecified: Secondary | ICD-10-CM | POA: Diagnosis not present

## 2023-04-28 DIAGNOSIS — Z961 Presence of intraocular lens: Secondary | ICD-10-CM | POA: Diagnosis not present

## 2023-04-28 DIAGNOSIS — H47292 Other optic atrophy, left eye: Secondary | ICD-10-CM | POA: Diagnosis not present

## 2023-04-28 DIAGNOSIS — H35372 Puckering of macula, left eye: Secondary | ICD-10-CM | POA: Diagnosis not present

## 2023-04-28 DIAGNOSIS — H2511 Age-related nuclear cataract, right eye: Secondary | ICD-10-CM | POA: Diagnosis not present

## 2023-04-28 DIAGNOSIS — H5211 Myopia, right eye: Secondary | ICD-10-CM | POA: Diagnosis not present

## 2023-05-01 DIAGNOSIS — C61 Malignant neoplasm of prostate: Secondary | ICD-10-CM | POA: Diagnosis not present

## 2023-05-08 DIAGNOSIS — C61 Malignant neoplasm of prostate: Secondary | ICD-10-CM | POA: Diagnosis not present

## 2023-05-08 DIAGNOSIS — R351 Nocturia: Secondary | ICD-10-CM | POA: Diagnosis not present

## 2023-05-19 ENCOUNTER — Encounter: Payer: Self-pay | Admitting: Medical

## 2023-05-19 ENCOUNTER — Ambulatory Visit (INDEPENDENT_AMBULATORY_CARE_PROVIDER_SITE_OTHER): Payer: Medicare HMO | Admitting: Medical

## 2023-05-19 VITALS — BP 130/65 | HR 78 | Temp 98.0°F | Resp 18 | Ht 67.0 in | Wt 174.0 lb

## 2023-05-19 DIAGNOSIS — R739 Hyperglycemia, unspecified: Secondary | ICD-10-CM | POA: Diagnosis not present

## 2023-05-19 DIAGNOSIS — E785 Hyperlipidemia, unspecified: Secondary | ICD-10-CM | POA: Diagnosis not present

## 2023-05-19 DIAGNOSIS — I493 Ventricular premature depolarization: Secondary | ICD-10-CM

## 2023-05-19 DIAGNOSIS — Z8249 Family history of ischemic heart disease and other diseases of the circulatory system: Secondary | ICD-10-CM

## 2023-05-19 DIAGNOSIS — N400 Enlarged prostate without lower urinary tract symptoms: Secondary | ICD-10-CM

## 2023-05-19 DIAGNOSIS — I10 Essential (primary) hypertension: Secondary | ICD-10-CM | POA: Diagnosis not present

## 2023-05-19 LAB — COMPREHENSIVE METABOLIC PANEL
ALT: 16 U/L (ref 0–53)
AST: 20 U/L (ref 0–37)
Albumin: 4 g/dL (ref 3.5–5.2)
Alkaline Phosphatase: 49 U/L (ref 39–117)
BUN: 17 mg/dL (ref 6–23)
CO2: 29 mEq/L (ref 19–32)
Calcium: 9.4 mg/dL (ref 8.4–10.5)
Chloride: 98 mEq/L (ref 96–112)
Creatinine, Ser: 0.96 mg/dL (ref 0.40–1.50)
GFR: 73.04 mL/min (ref >60.00)
Glucose, Bld: 89 mg/dL (ref 70–99)
Potassium: 4.6 mEq/L (ref 3.5–5.1)
Sodium: 136 mEq/L (ref 135–145)
Total Bilirubin: 0.8 mg/dL (ref 0.2–1.2)
Total Protein: 6.9 g/dL (ref 6.0–8.3)

## 2023-05-19 LAB — LIPID PANEL
Cholesterol: 155 mg/dL (ref 0–200)
HDL: 68.5 mg/dL (ref >39.00)
LDL Cholesterol: 76 mg/dL (ref 0–99)
NonHDL: 86.5
Total CHOL/HDL Ratio: 2
Triglycerides: 54 mg/dL (ref 0.0–149.0)
VLDL: 10.8 mg/dL (ref 0.0–40.0)

## 2023-05-19 LAB — HEMOGLOBIN A1C: Hgb A1c MFr Bld: 5.9 % (ref 4.6–6.5)

## 2023-05-19 NOTE — Patient Instructions (Addendum)
1. Hypertension. Bp well controlled. Continue amlodipine 5 mg daily and lisinopril 10 mg 2 tab daily. - Comp Met (CMET) - EKG 12-Lead  2. Elevated blood sugar Recommend low sugar diet. - Hemoglobin A1c  3. Hyperlipidemia, unspecified hyperlipidemia type Continue artorvastatin. - Lipid panel - EKG 12-Lead  4. Benign prostatic hyperplasia, unspecified whether lower urinary tract symptoms present Continue flomax daily.  5. Family history of MI. Mom in 71's. On discussion with htn, high cholesterol and family hx will get baseline EKG. - EKG 12-Lead   6. Counseled on sinus rhythm EKG with rare appears unifocal pvc. Avoid caffeine. If gets palpitation let us know. Be seen same day. Any severe palpiation/cardiac symptoms be seen in ED. Educated on pvc.  Follow up date to be determined after lab review.  Premature Ventricular Contraction  A premature ventricular contraction (PVC) is a type of irregular heartbeat (arrhythmia). The heart has four chambers, including the upper chambers (atria) and lower chambers (ventricles). Normally, an electrical signal starts in a group of cells called the sinoatrial node (SA node) and travels through the atria, causing them to pump blood into the ventricles. During a PVC, the heartbeat starts in one of the lower ventricles. This may cause the heartbeat to be shorter and less effective. In most cases, PVCs come and go and do not require treatment. What are the causes? Common causes of the condition include: Heart attack or coronary artery disease (CAD). Heart valve problems. Heart surgery. Infection of the heart (myocarditis). Inflammation of the heart. In many cases, the cause of this condition is not known. What increases the risk? The following factors may make you more likely to develop this condition: Age, especially being over age 17. Being male. An imbalance of salts and minerals in the body (electrolytes). Low blood oxygen levels or high  carbon dioxide levels. Certain medicines, including over-the-counter and prescribed medicines. High blood pressure. Obesity. Episodes may be triggered by: Vigorous exercise. Tobacco, alcohol, or caffeine use. Illegal drug use. Emotional stress. Poor or irregular sleep. What are the signs or symptoms? The main symptoms of this condition are fast or irregular heartbeats (palpitations) or the feeling of a pause in the heartbeat. Other symptoms include: Shortness of breath. Difficulty exercising. Chest pain. Feeling tired. Dizziness. In some cases, there are no symptoms. How is this diagnosed? This condition may be diagnosed based on: Your medical history or symptoms. A physical exam. Your health care provider may listen to your heart. Tests, such as: Blood tests. An ECG (electrocardiogram) to monitor the electrical activity of your heart. An ambulatory cardiac monitor that records your heartbeats for 24 hours or more. Stress tests to see how exercise affects your heart rhythm and blood supply. An echocardiogram, which creates an image of your heart. An electrophysiology study (EPS) to check for electrical problems in your heart. How is this treated? Treatment for this condition depends on any underlying conditions, the type of PVC, how many PVCs you have had, and if the symptoms are affecting your daily life. Possible treatments include: Avoiding things that cause PVCs (triggers). These include caffeine, tobacco, and alcohol. Taking medicines if symptoms are severe or if the arrhythmias happen a lot. Getting treatment for underlying conditions that cause PVCs. Having an implantable cardioverter defibrillator (ICD) placed in the chest to monitor the heartbeat. The monitor sends a shock to the heart if it senses an arrhythmia and brings the heartbeat back to normal. Having a catheter ablation procedure to destroy the part of the  heart tissue that sends abnormal signals. In many cases,  no treatment is required. Follow these instructions at home: Lifestyle  Do not use any products that contain nicotine or tobacco. These products include cigarettes, chewing tobacco, and vaping devices, such as e-cigarettes. If you need help quitting, ask your health care provider. Do not use illegal drugs. Exercise regularly. Ask your health care provider what type of exercise is safe for you. Try to get at least 7-9 hours of sleep each night. Find healthy ways to manage stress. Avoid stressful situations when possible. Alcohol use Do not drink alcohol if: Your health care provider tells you not to drink. You are pregnant, may be pregnant, or are planning to become pregnant. Alcohol triggers your episodes. If you drink alcohol: Limit how much you have to: 0-1 drink a day for women. 0-2 drinks a day for men. Know how much alcohol is in your drink. In the [place] one drink equals one 12 oz bottle of beer (355 mL), one 5 oz glass of wine (148 mL), or one 1 oz glass of hard liquor (44 mL). General instructions Take over-the-counter and prescription medicines only as told by your health care provider. If caffeine triggers episodes of PVC, do not eat, drink, or use anything with caffeine in it. Contact a health care provider if: You feel palpitations often. You have nausea and vomiting. Get help right away if: You have chest pain. You have trouble breathing. You start sweating for no reason. You become light-headed or you faint. These symptoms may be an emergency. Get help right away. Call 911. Do not wait to see if the symptoms will go away. Do not drive yourself to the hospital. This information is not intended to replace advice given to you by your health care provider. Make sure you discuss any questions you have with your health care provider. Document Revised: 05/17/2022 Document Reviewed: 05/17/2022 Elsevier Patient Education  2023 [employer].

## 2023-05-19 NOTE — Progress Notes (Signed)
Subjective:    Patient ID: Donald Porter, male    DOB: August 08, 1941, 82 y.o.   MRN: [id number]  HPI  Pt last seen in 09-10-2022.  "Hypertension- bp is well controlled. Continue amlodipine 5 mg daily and lisinopril 10 mg 2 tab daily.   Hyperlipidemia- continue simvastatin. Lipid panel today.   For bph urinary type symptoms continue flomax 2 tab po q day.    For elevated sugar will get cmp and a1c today.   Albuterol available to use if needed."  Pt bp initially on high side. Better does not check at home. Pt is on amlodipine 5 mg daily and lisinopril 10 mg daily.  Hyperlipidemia- continue simvastatin. Lipid panel today.  Pt still on flomax daily for bph.   Has mild high sugar in past. Last A1c was 5.9.  Pt walking a mile about 3-4 days a week.  Review of Systems  Constitutional:  Negative for chills, fatigue, fever and unexpected weight change.  HENT:  Negative for congestion, ear discharge, ear pain, facial swelling, nosebleeds, postnasal drip and sinus pressure.   Respiratory:  Negative for cough, chest tightness, shortness of breath and wheezing.   Cardiovascular:  Negative for chest pain and palpitations.  Gastrointestinal:  Negative for abdominal pain, constipation, diarrhea and nausea.  Genitourinary:  Negative for dysuria, frequency, penile pain and urgency.  Musculoskeletal:  Negative for back pain, myalgias and neck stiffness.  Skin:  Negative for rash.  Neurological:  Negative for seizures, syncope, facial asymmetry and light-headedness.  Hematological:  Negative for adenopathy. Does not bruise/bleed easily.  Psychiatric/Behavioral:  Negative for behavioral problems, decreased concentration and dysphoric mood.    Past Medical History:  Diagnosis Date   Allergy    Asthma    1988   GERD (gastroesophageal reflux disease)    Hyperlipidemia 04/26/2015   Hypertension    Malignant neoplasm of prostate (HCC) 05/02/2020   Nuclear sclerotic cataract of left eye 08/26/2017    Prostate cancer [facility])      Social History   Socioeconomic History   Marital status: Widowed    Spouse name: Not on file   Number of children: 2   Years of education: Not on file   Highest education level: Not on file  Occupational History   Occupation: accountant    Comment: works part time  Tobacco Use   Smoking status: Never   Smokeless tobacco: Never  Vaping Use   Vaping Use: Never used  Substance and Sexual Activity   Alcohol use: Yes    Alcohol/week: 2.0 standard drinks of alcohol    Types: 2 Glasses of wine per week    Comment: 1-2 glasses of wine 4-5 days per week   Drug use: Never   Sexual activity: Not Currently    Comment: widowed in 2018 after 57 years of marriage.  Other Topics Concern   Not on file  Social History Narrative   One living child. Lost his son at 64 to kidney disease. Daughter lives in [place].   Social Determinants of Health   Financial Resource Strain: Low Risk  (11/15/2021)   Overall Financial Resource Strain (CARDIA)    Difficulty of Paying Living Expenses: Not hard at all  Food Insecurity: No Food Insecurity (06/14/2021)   Hunger Vital Sign    Worried About Running Out of Food in the Last Year: Never true    Ran Out of Food in the Last Year: Never true  Transportation Needs: Unmet Transportation Needs (06/14/2021)   PRAPARE -  Administrator, Civil Service (Medical): Yes    Lack of Transportation (Non-Medical): Yes  Physical Activity: Insufficiently Active (06/14/2021)   Exercise Vital Sign    Days of Exercise per Week: 4 days    Minutes of Exercise per Session: 30 min  Stress: No Stress Concern Present (06/14/2021)   [employer] of Occupational Health - Occupational Stress Questionnaire    Feeling of Stress : Not at all  Social Connections: Moderately Isolated (11/15/2021)   Social Connection and Isolation Panel [NHANES]    Frequency of Communication with Friends and Family: More than three times a week    Frequency  of Social Gatherings with Friends and Family: More than three times a week    Attends Religious Services: 1 to 4 times per year    Active Member of [employer] or Organizations: No    Attends Banker Meetings: Never    Marital Status: Widowed  Intimate Partner Violence: Not At Risk (06/14/2021)   Humiliation, Afraid, Rape, and Kick questionnaire    Fear of Current or Ex-Partner: No    Emotionally Abused: No    Physically Abused: No    Sexually Abused: No    Past Surgical History:  Procedure Laterality Date   EYE SURGERY     Age 27   HERNIA REPAIR     3 surgeries   PROSTATE BIOPSY      Family History  Problem Relation Age of Onset   Heart disease Mother    Lymphoma Father    Cancer Father    Lung cancer Maternal Uncle    Lung cancer Maternal Uncle    Breast cancer Neg Hx    Colon cancer Neg Hx    Pancreatic cancer Neg Hx    Prostate cancer Neg Hx     No Known Allergies  Current Outpatient Medications on File Prior to Visit  Medication Sig Dispense Refill   albuterol (PROAIR HFA) 108 (90 Base) MCG/ACT inhaler TAKE 2 PUFFS BY MOUTH EVERY 6 HOURS AS NEEDED FOR WHEEZE OR SHORTNESS OF BREATH 90 g 2   amLODipine (NORVASC) 5 MG tablet TAKE 1 TABLET (5 MG TOTAL) BY MOUTH DAILY. 90 tablet 1   lisinopril (ZESTRIL) 10 MG tablet TAKE 2 TABLETS BY MOUTH EVERY DAY 180 tablet 1   Multiple Vitamins-Minerals (MENS MULTIVITAMIN PLUS) TABS Take by mouth daily.     simvastatin (ZOCOR) 20 MG tablet TAKE 1 TABLET BY MOUTH EVERYDAY AT BEDTIME 90 tablet 1   tamsulosin (FLOMAX) 0.4 MG CAPS capsule TAKE 1 CAPSULE BY MOUTH TWICE A DAY 180 capsule 3   No current facility-administered medications on file prior to visit.    BP 130/65   Pulse 78   Temp 98 F (36.7 C)   Resp 18   Ht 5\' 7"  (1.702 m)   Wt 174 lb (78.9 kg)   SpO2 96%   BMI 27.25 kg/m           Objective:   Physical Exam  General Mental Status- Alert. General Appearance- Not in acute distress.   Skin General:  Color- Normal Color. Moisture- Normal Moisture.  Neck Carotid Arteries- Normal color. Moisture- Normal Moisture. No carotid bruits. No JVD.  Chest and Lung Exam Auscultation: Breath Sounds:-Normal.  Cardiovascular Auscultation:Rythm- Regular. Murmurs & Other Heart Sounds:Auscultation of the heart reveals- No Murmurs.  Abdomen Inspection:-Inspeection Normal. Palpation/Percussion:Note:No mass. Palpation and Percussion of the abdomen reveal- Non Tender, Non Distended + BS, no rebound or guarding.   Neurologic Cranial  Nerve exam:- CN III-XII intact(No nystagmus), symmetric smile. Strength:- 5/5 equal and symmetric strength both upper and lower extremities.       Assessment & Plan:   Patient Instructions  1. Hypertension. Bp well controlled. Continue amlodipine 5 mg daily and lisinopril 10 mg 2 tab daily. - Comp Met (CMET) - EKG 12-Lead  2. Elevated blood sugar Recommend low sugar diet. - Hemoglobin A1c  3. Hyperlipidemia, unspecified hyperlipidemia type Continue artorvastatin. - Lipid panel - EKG 12-Lead  4. Benign prostatic hyperplasia, unspecified whether lower urinary tract symptoms present Continue flomax daily.  5. Family history of MI. Mom in 28's. On discussion with htn, high cholesterol and family hx will get baseline EKG. - EKG 12-Lead   Follow up date to be determined after lab review.       Esperanza Richters, PA-C

## 2023-05-29 ENCOUNTER — Other Ambulatory Visit: Payer: Self-pay | Admitting: Medical

## 2023-06-03 ENCOUNTER — Telehealth: Payer: Self-pay | Admitting: Medical

## 2023-06-03 NOTE — Telephone Encounter (Signed)
Patient called and stated in last visit with provider they spoke of being referred to a cardiologist. He would like the referral placed please. Thank you

## 2023-06-03 NOTE — Addendum Note (Signed)
Addended by: Gwenevere Abbot on: 06/03/2023 11:29 AM   Modules accepted: Orders

## 2023-06-03 NOTE — Telephone Encounter (Signed)
Pt.notified

## 2023-06-09 DIAGNOSIS — R3915 Urgency of urination: Secondary | ICD-10-CM | POA: Diagnosis not present

## 2023-06-20 ENCOUNTER — Ambulatory Visit (INDEPENDENT_AMBULATORY_CARE_PROVIDER_SITE_OTHER): Payer: Medicare HMO | Admitting: *Deleted

## 2023-06-20 DIAGNOSIS — Z0000 Encounter for general adult medical examination without abnormal findings: Secondary | ICD-10-CM | POA: Diagnosis not present

## 2023-06-20 NOTE — Progress Notes (Signed)
Subjective:   Donald Porter is a 82 y.o. male who presents for Medicare Annual/Subsequent preventive examination.  Visit Complete: Virtual  I connected with  [name] on 06/20/23 by a audio enabled telemedicine application and verified that I am speaking with the correct person using two identifiers.  Patient Location: Home  Provider Location: Office/Clinic  I discussed the limitations of evaluation and management by telemedicine. The patient expressed understanding and agreed to proceed.   Review of Systems     Cardiac Risk Factors include: male gender;advanced age (>32men, >8 women);dyslipidemia;hypertension     Objective:    Today's Vitals   There is no height or weight on file to calculate BMI.     06/20/2023    9:40 AM 06/18/2022    9:05 AM 06/14/2021    9:23 AM 06/09/2020    9:46 AM 05/02/2020    8:50 AM 06/04/2019   10:16 AM 06/01/2018   10:11 AM  Advanced Directives  Does Patient Have a Medical Advance Directive? Yes Yes Yes Yes Yes Yes Yes  Type of Estate agent of [place];Living will Healthcare Power of [place];Out of facility DNR (pink MOST or yellow form);Living will Healthcare Power of [place];Living will Healthcare Power of [place];Living will Living will;Healthcare Power of Attorney Living will;Healthcare Power of [employer] Power of [place];Living will  Does patient want to make changes to medical advance directive?  No - Patient declined   No - Patient declined No - Patient declined No - Patient declined  Copy of Healthcare Power of Attorney in Chart? No - copy requested No - copy requested No - copy requested No - copy requested No - copy requested No - copy requested No - copy requested    Current Medications (verified) Outpatient Encounter Medications as of 06/20/2023  Medication Sig   albuterol (PROAIR HFA) 108 (90 Base) MCG/ACT inhaler TAKE 2 PUFFS BY MOUTH EVERY 6 HOURS AS NEEDED FOR WHEEZE OR SHORTNESS OF BREATH    amLODipine (NORVASC) 5 MG tablet TAKE 1 TABLET (5 MG TOTAL) BY MOUTH DAILY.   lisinopril (ZESTRIL) 10 MG tablet TAKE 2 TABLETS BY MOUTH EVERY DAY   Multiple Vitamins-Minerals (MENS MULTIVITAMIN PLUS) TABS Take by mouth daily.   simvastatin (ZOCOR) 20 MG tablet TAKE 1 TABLET BY MOUTH EVERYDAY AT BEDTIME   tamsulosin (FLOMAX) 0.4 MG CAPS capsule TAKE 1 CAPSULE BY MOUTH TWICE A DAY   No facility-administered encounter medications on file as of 06/20/2023.    Allergies (verified) Patient has no known allergies.   History: Past Medical History:  Diagnosis Date   Allergy    Asthma    1988   GERD (gastroesophageal reflux disease)    Hyperlipidemia 04/26/2015   Hypertension    Malignant neoplasm of prostate (HCC) 05/02/2020   Nuclear sclerotic cataract of left eye 08/26/2017   Prostate cancer [facility])    Past Surgical History:  Procedure Laterality Date   EYE SURGERY     Age 62   HERNIA REPAIR     3 surgeries   PROSTATE BIOPSY     Family History  Problem Relation Age of Onset   Heart disease Mother    Lymphoma Father    Cancer Father    Lung cancer Maternal Uncle    Lung cancer Maternal Uncle    Breast cancer Neg Hx    Colon cancer Neg Hx    Pancreatic cancer Neg Hx    Prostate cancer Neg Hx    Social History  Socioeconomic History   Marital status: Widowed    Spouse name: Not on file   Number of children: 2   Years of education: Not on file   Highest education level: Not on file  Occupational History   Occupation: accountant    Comment: works part time  Tobacco Use   Smoking status: Never   Smokeless tobacco: Never  Vaping Use   Vaping Use: Never used  Substance and Sexual Activity   Alcohol use: Yes    Alcohol/week: 2.0 standard drinks of alcohol    Types: 2 Glasses of wine per week    Comment: 1-2 glasses of wine 4-5 days per week   Drug use: Never   Sexual activity: Not Currently    Comment: widowed in 2018 after 57 years of marriage.  Other Topics Concern    Not on file  Social History Narrative   One living child. Lost his son at 68 to kidney disease. Daughter lives in [place].   Social Determinants of Health   Financial Resource Strain: Low Risk  (06/20/2023)   Overall Financial Resource Strain (CARDIA)    Difficulty of Paying Living Expenses: Not hard at all  Food Insecurity: No Food Insecurity (06/20/2023)   Hunger Vital Sign    Worried About Running Out of Food in the Last Year: Never true    Ran Out of Food in the Last Year: Never true  Transportation Needs: No Transportation Needs (06/20/2023)   PRAPARE - Administrator, Civil Service (Medical): No    Lack of Transportation (Non-Medical): No  Physical Activity: Sufficiently Active (06/20/2023)   Exercise Vital Sign    Days of Exercise per Week: 5 days    Minutes of Exercise per Session: 30 min  Stress: No Stress Concern Present (06/14/2021)   [employer] of Occupational Health - Occupational Stress Questionnaire    Feeling of Stress : Not at all  Social Connections: Moderately Isolated (11/15/2021)   Social Connection and Isolation Panel [NHANES]    Frequency of Communication with Friends and Family: More than three times a week    Frequency of Social Gatherings with Friends and Family: More than three times a week    Attends Religious Services: 1 to 4 times per year    Active Member of [employer] or Organizations: No    Attends Banker Meetings: Never    Marital Status: Widowed    Tobacco Counseling Counseling given: Not Answered   Clinical Intake:  Pre-visit preparation completed: Yes  Pain : No/denies pain  Nutritional Risks: None Diabetes: No  How often do you need to have someone help you when you read instructions, pamphlets, or other written materials from your doctor or pharmacy?: 1 - Never  Interpreter Needed?: No  Information entered by :: Donne Anon, CMA   Activities of Daily Living    06/20/2023    9:44 AM  In your present  state of health, do you have any difficulty performing the following activities:  Hearing? 0  Vision? 0  Difficulty concentrating or making decisions? 0  Walking or climbing stairs? 0  Dressing or bathing? 0  Doing errands, shopping? 0  Preparing Food and eating ? N  Using the Toilet? N  In the past six months, have you accidently leaked urine? Y  Do you have problems with loss of bowel control? N  Managing your Medications? N  Managing your Finances? N  Housekeeping or managing your Housekeeping? N    Patient Care  Team: Marisue Brooklyn as PCP - General (Physician Assistant) Henrene Pastor, RPH-CPP (Pharmacist)  Indicate any recent Medical Services you may have received from other than [facility] providers in the past year (date may be approximate).     Assessment:   This is a routine wellness examination for Donald Porter.  Hearing/Vision screen No results found.  Dietary issues and exercise activities discussed:     Goals Addressed   None    Depression Screen    06/20/2023    9:55 AM 06/18/2022    9:07 AM 06/14/2021    9:13 AM 06/09/2020    9:44 AM 06/04/2019   10:16 AM 06/01/2018   10:16 AM 02/05/2017   10:03 AM  PHQ 2/9 Scores  PHQ - 2 Score 0 0 1 0 0 0 0    Fall Risk    06/20/2023    9:53 AM 05/19/2023    9:14 AM 06/18/2022    9:06 AM 06/14/2021    9:13 AM 03/15/2021    8:35 AM  Fall Risk   Falls in the past year? 0 0 0 0 0  Number falls in past yr: 0 0 0 0 0  Injury with Fall? 0 0 0 0 0  Risk for fall due to : No Fall Risks No Fall Risks No Fall Risks  No Fall Risks  Follow up Falls evaluation completed Education provided;Falls evaluation completed Falls evaluation completed Falls prevention discussed Falls evaluation completed    MEDICARE RISK AT HOME:  Medicare Risk at Home - 06/20/23 0951     Any stairs in or around the home? Yes    If so, are there any without handrails? No    Home free of loose throw rugs in walkways, pet beds, electrical cords, etc? Yes     Adequate lighting in your home to reduce risk of falls? Yes    Life alert? No    Use of a cane, walker or w/c? No    Grab bars in the bathroom? Yes    Shower chair or bench in shower? Yes    Elevated toilet seat or a handicapped toilet? No             TIMED UP AND GO:  Was the test performed?  No    Cognitive Function:        06/20/2023    9:58 AM 06/18/2022    9:24 AM  6CIT Screen  What Year? 0 points 0 points  What month? 0 points 0 points  What time? 0 points 0 points  Count back from 20 0 points 0 points  Months in reverse 0 points 0 points  Repeat phrase 0 points 0 points  Total Score 0 points 0 points    Immunizations Immunization History  Administered Date(s) Administered   Fluad Quad(high Dose 65+) 09/20/2019, 12/06/2020, 10/30/2021, 09/10/2022   Influenza, High Dose Seasonal PF 10/17/2017, 10/07/2018   Influenza,inj,Quad PF,6+ Mos 11/09/2015   Influenza-Unspecified 09/18/2016   PFIZER(Purple Top)SARS-COV-2 Vaccination 01/08/2020, 01/29/2020, 10/11/2020   Pneumococcal Conjugate-13 02/05/2017   Pneumococcal Polysaccharide-23 12/07/2015   Tdap 07/10/2016   Zoster Recombinat (Shingrix) 06/16/2021, 10/03/2021   Zoster, Live 11/09/2015    TDAP status: Up to date  Flu Vaccine status: Up to date  Pneumococcal vaccine status: Up to date  Covid-19 vaccine status: Information provided on how to obtain vaccines.   Qualifies for Shingles Vaccine? Yes   Zostavax completed Yes   Shingrix Completed?: Yes  Screening Tests Health Maintenance  Topic Date Due  COVID-19 Vaccine (4 - 2023-24 season) 08/30/2022   INFLUENZA VACCINE  07/31/2023   Medicare Annual Wellness (AWV)  06/19/2024   DTaP/Tdap/Td (2 - Td or Tdap) 07/10/2026   Pneumonia Vaccine 71+ Years old  Completed   Zoster Vaccines- Shingrix  Completed   HPV VACCINES  Aged Out    Health Maintenance  Health Maintenance Due  Topic Date Due   COVID-19 Vaccine (4 - 2023-24 season) 08/30/2022     Colorectal cancer screening: No longer required.   Lung Cancer Screening: (Low Dose CT Chest recommended if Age 84-80 years, 20 pack-year currently smoking OR have quit w/in 15years.) does not qualify.    Additional Screening:  Hepatitis C Screening: does not qualify  Vision Screening: Recommended annual ophthalmology exams for early detection of glaucoma and other disorders of the eye. Is the patient up to date with their annual eye exam?  Yes  Who is the provider or what is the name of the office in which the patient attends annual eye exams? Dr. Durene Fruits If pt is not established with a provider, would they like to be referred to a provider to establish care? No .   Dental Screening: Recommended annual dental exams for proper oral hygiene  Diabetic Foot Exam: N/a  Community Resource Referral / Chronic Care Management: CRR required this visit?  No   CCM required this visit?  No     Plan:     I have personally reviewed and noted the following in the patient's chart:   Medical and social history Use of alcohol, tobacco or illicit drugs  Current medications and supplements including opioid prescriptions. Patient is not currently taking opioid prescriptions. Functional ability and status Nutritional status Physical activity Advanced directives List of other physicians Hospitalizations, surgeries, and ER visits in previous 12 months Vitals Screenings to include cognitive, depression, and falls Referrals and appointments  In addition, I have reviewed and discussed with patient certain preventive protocols, quality metrics, and best practice recommendations. A written personalized care plan for preventive services as well as general preventive health recommendations were provided to patient.     Donne Anon, CMA   06/20/2023   After Visit Summary: (Mail) Due to this being a telephonic visit, the after visit summary with patients personalized plan was offered to patient  via mail   Nurse Notes: None

## 2023-06-20 NOTE — Patient Instructions (Signed)
Donald Porter , Thank you for taking time to come for your Medicare Wellness Visit. I appreciate your ongoing commitment to your health goals. Please review the following plan we discussed and let me know if I can assist you in the future.      This is a list of the screening recommended for you and due dates:  Health Maintenance  Topic Date Due   COVID-19 Vaccine (4 - 2023-24 season) 08/30/2022   Flu Shot  07/31/2023   Medicare Annual Wellness Visit  06/19/2024   DTaP/Tdap/Td vaccine (2 - Td or Tdap) 07/10/2026   Pneumonia Vaccine  Completed   Zoster (Shingles) Vaccine  Completed   HPV Vaccine  Aged Out    Next appointment: Follow up in one year for your annual wellness visit.   Preventive Care 16 Years and Older, Male Preventive care refers to lifestyle choices and visits with your health care provider that can promote health and wellness. What does preventive care include? A yearly physical exam. This is also called an annual well check. Dental exams once or twice a year. Routine eye exams. Ask your health care provider how often you should have your eyes checked. Personal lifestyle choices, including: Daily care of your teeth and gums. Regular physical activity. Eating a healthy diet. Avoiding tobacco and drug use. Limiting alcohol use. Practicing safe sex. Taking low doses of aspirin every day. Taking vitamin and mineral supplements as recommended by your health care provider. What happens during an annual well check? The services and screenings done by your health care provider during your annual well check will depend on your age, overall health, lifestyle risk factors, and family history of disease. Counseling  Your health care provider may ask you questions about your: Alcohol use. Tobacco use. Drug use. Emotional well-being. Home and relationship well-being. Sexual activity. Eating habits. History of falls. Memory and ability to understand (cognition). Work and  work Astronomer. Screening  You may have the following tests or measurements: Height, weight, and BMI. Blood pressure. Lipid and cholesterol levels. These may be checked every 5 years, or more frequently if you are over 80 years old. Skin check. Lung cancer screening. You may have this screening every year starting at age 54 if you have a 30-pack-year history of smoking and currently smoke or have quit within the past 15 years. Fecal occult blood test (FOBT) of the stool. You may have this test every year starting at age 35. Flexible sigmoidoscopy or colonoscopy. You may have a sigmoidoscopy every 5 years or a colonoscopy every 10 years starting at age 20. Prostate cancer screening. Recommendations will vary depending on your family history and other risks. Hepatitis C blood test. Hepatitis B blood test. Sexually transmitted disease (STD) testing. Diabetes screening. This is done by checking your blood sugar (glucose) after you have not eaten for a while (fasting). You may have this done every 1-3 years. Abdominal aortic aneurysm (AAA) screening. You may need this if you are a current or former smoker. Osteoporosis. You may be screened starting at age 51 if you are at high risk. Talk with your health care provider about your test results, treatment options, and if necessary, the need for more tests. Vaccines  Your health care provider may recommend certain vaccines, such as: Influenza vaccine. This is recommended every year. Tetanus, diphtheria, and acellular pertussis (Tdap, Td) vaccine. You may need a Td booster every 10 years. Zoster vaccine. You may need this after age 19. Pneumococcal 13-valent conjugate (PCV13)  vaccine. One dose is recommended after age 40. Pneumococcal polysaccharide (PPSV23) vaccine. One dose is recommended after age 2. Talk to your health care provider about which screenings and vaccines you need and how often you need them. This information is not intended to  replace advice given to you by your health care provider. Make sure you discuss any questions you have with your health care provider. Document Released: 01/12/2016 Document Revised: 09/04/2016 Document Reviewed: 10/17/2015 Elsevier Interactive Patient Education  2017 [employer].  Fall Prevention in the Home Falls can cause injuries. They can happen to people of all ages. There are many things you can do to make your home safe and to help prevent falls. What can I do on the outside of my home? Regularly fix the edges of walkways and driveways and fix any cracks. Remove anything that might make you trip as you walk through a door, such as a raised step or threshold. Trim any bushes or trees on the path to your home. Use bright outdoor lighting. Clear any walking paths of anything that might make someone trip, such as rocks or tools. Regularly check to see if handrails are loose or broken. Make sure that both sides of any steps have handrails. Any raised decks and porches should have guardrails on the edges. Have any leaves, snow, or ice cleared regularly. Use sand or salt on walking paths during winter. Clean up any spills in your garage right away. This includes oil or grease spills. What can I do in the bathroom? Use night lights. Install grab bars by the toilet and in the tub and shower. Do not use towel bars as grab bars. Use non-skid mats or decals in the tub or shower. If you need to sit down in the shower, use a plastic, non-slip stool. Keep the floor dry. Clean up any water that spills on the floor as soon as it happens. Remove soap buildup in the tub or shower regularly. Attach bath mats securely with double-sided non-slip rug tape. Do not have throw rugs and other things on the floor that can make you trip. What can I do in the bedroom? Use night lights. Make sure that you have a light by your bed that is easy to reach. Do not use any sheets or blankets that are too big for  your bed. They should not hang down onto the floor. Have a firm chair that has side arms. You can use this for support while you get dressed. Do not have throw rugs and other things on the floor that can make you trip. What can I do in the kitchen? Clean up any spills right away. Avoid walking on wet floors. Keep items that you use a lot in easy-to-reach places. If you need to reach something above you, use a strong step stool that has a grab bar. Keep electrical cords out of the way. Do not use floor polish or wax that makes floors slippery. If you must use wax, use non-skid floor wax. Do not have throw rugs and other things on the floor that can make you trip. What can I do with my stairs? Do not leave any items on the stairs. Make sure that there are handrails on both sides of the stairs and use them. Fix handrails that are broken or loose. Make sure that handrails are as long as the stairways. Check any carpeting to make sure that it is firmly attached to the stairs. Fix any carpet that is loose  or worn. Avoid having throw rugs at the top or bottom of the stairs. If you do have throw rugs, attach them to the floor with carpet tape. Make sure that you have a light switch at the top of the stairs and the bottom of the stairs. If you do not have them, ask someone to add them for you. What else can I do to help prevent falls? Wear shoes that: Do not have high heels. Have rubber bottoms. Are comfortable and fit you well. Are closed at the toe. Do not wear sandals. If you use a stepladder: Make sure that it is fully opened. Do not climb a closed stepladder. Make sure that both sides of the stepladder are locked into place. Ask someone to hold it for you, if possible. Clearly mark and make sure that you can see: Any grab bars or handrails. First and last steps. Where the edge of each step is. Use tools that help you move around (mobility aids) if they are needed. These  include: Canes. Walkers. Scooters. Crutches. Turn on the lights when you go into a dark area. Replace any light bulbs as soon as they burn out. Set up your furniture so you have a clear path. Avoid moving your furniture around. If any of your floors are uneven, fix them. If there are any pets around you, be aware of where they are. Review your medicines with your doctor. Some medicines can make you feel dizzy. This can increase your chance of falling. Ask your doctor what other things that you can do to help prevent falls. This information is not intended to replace advice given to you by your health care provider. Make sure you discuss any questions you have with your health care provider. Document Released: 10/12/2009 Document Revised: 05/23/2016 Document Reviewed: 01/20/2015 Elsevier Interactive Patient Education  2017 [employer].

## 2023-07-11 ENCOUNTER — Other Ambulatory Visit: Payer: Self-pay | Admitting: Medical

## 2023-08-12 ENCOUNTER — Ambulatory Visit: Payer: Medicare HMO | Attending: Cardiology

## 2023-08-12 ENCOUNTER — Encounter: Payer: Self-pay | Admitting: Cardiology

## 2023-08-12 ENCOUNTER — Ambulatory Visit: Payer: Medicare HMO | Attending: Cardiology | Admitting: Cardiology

## 2023-08-12 VITALS — BP 118/68 | HR 74 | Ht 68.0 in | Wt 172.0 lb

## 2023-08-12 DIAGNOSIS — C61 Malignant neoplasm of prostate: Secondary | ICD-10-CM

## 2023-08-12 DIAGNOSIS — R0609 Other forms of dyspnea: Secondary | ICD-10-CM

## 2023-08-12 DIAGNOSIS — I10 Essential (primary) hypertension: Secondary | ICD-10-CM

## 2023-08-12 DIAGNOSIS — Z8249 Family history of ischemic heart disease and other diseases of the circulatory system: Secondary | ICD-10-CM

## 2023-08-12 DIAGNOSIS — I493 Ventricular premature depolarization: Secondary | ICD-10-CM | POA: Diagnosis not present

## 2023-08-12 DIAGNOSIS — E782 Mixed hyperlipidemia: Secondary | ICD-10-CM

## 2023-08-12 NOTE — Progress Notes (Signed)
Cardiology Consultation:    Date:  08/12/2023   ID:  Donald Porter, DOB 1941-07-23, MRN [id number]  PCP:  Esperanza Richters, PA-C  Cardiologist:  Gypsy Balsam, MD   Referring MD: Marisue Brooklyn   Chief Complaint  Patient presents with   Establish Care    Fam h/o MI and PVC    Abnormal ECG    History of Present Illness:    Donald Porter is a 82 y.o. male who is being seen today for the evaluation of abnormal EKG at the request of [name], Ramon Dredge, [place].  Past medical history significant for essential hypertension on medication appropriately controlled, dyslipidemia on no medications, family history of premature coronary artery disease.  He went to his primary care physician for regular checkup EKG was done and showed some PVCs and some nonspecific abnormality he was referred to [place].  He is doing quite well he retired a year ago.  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  Described to have some exertional shortness of breath.  Does not smoke quit smoking 45 years ago.  Does have family history of premature coronary artery disease, his mother apparently had MI before age of 52.  He does not have any chest pain tightness squeezing pressure burning chest, no dizziness no passing out no swelling of lower extremities.  Past Medical History:  Diagnosis Date   Allergy    Asthma    1988   GERD (gastroesophageal reflux disease)    Hyperlipidemia 04/26/2015   Hypertension    Malignant neoplasm of prostate (HCC) 05/02/2020   Nuclear sclerotic cataract of left eye 08/26/2017   Prostate cancer [facility])     Past Surgical History:  Procedure Laterality Date   EYE SURGERY     Age 57   HERNIA REPAIR     3 surgeries   PROSTATE BIOPSY      Current Medications: Current Meds  Medication Sig   albuterol (PROAIR HFA) 108 (90 Base) MCG/ACT inhaler TAKE 2 PUFFS BY MOUTH EVERY 6 HOURS AS  NEEDED FOR WHEEZE OR SHORTNESS OF BREATH (Patient taking differently: Inhale 2 puffs into the lungs every 6 (six) hours as needed for wheezing or shortness of breath. TAKE 2 PUFFS BY MOUTH EVERY 6 HOURS AS NEEDED FOR WHEEZE OR SHORTNESS OF BREATH)   amLODipine (NORVASC) 5 MG tablet TAKE 1 TABLET (5 MG TOTAL) BY MOUTH DAILY.   lisinopril (ZESTRIL) 10 MG tablet TAKE 2 TABLETS BY MOUTH EVERY DAY (Patient taking differently: Take 10 mg by mouth daily.)   Multiple Vitamins-Minerals (MENS MULTIVITAMIN PLUS) TABS Take 1 tablet by mouth daily.   simvastatin (ZOCOR) 20 MG tablet TAKE 1 TABLET BY MOUTH EVERYDAY AT BEDTIME (Patient taking differently: Take 20 mg by mouth daily at 6 PM. TAKE 1 TABLET BY MOUTH EVERYDAY AT BEDTIME)   tamsulosin (FLOMAX) 0.4 MG CAPS capsule TAKE 1 CAPSULE BY MOUTH TWICE A DAY     Allergies:   Patient has no known allergies.   Social History   Socioeconomic History   Marital status: Widowed    Spouse name: Not on file   Number of children: 2   Years of education: Not on file   Highest education level: Not on file  Occupational History   Occupation: accountant    Comment: works part time  Tobacco Use   Smoking status: Never  Smokeless tobacco: Never  Vaping Use   Vaping status: Never Used  Substance and Sexual Activity   Alcohol use: Yes    Alcohol/week: 2.0 standard drinks of alcohol    Types: 2 Glasses of wine per week    Comment: 1-2 glasses of wine 4-5 days per week   Drug use: Never   Sexual activity: Not Currently    Comment: widowed in 2018 after 57 years of marriage.  Other Topics Concern   Not on file  Social History Narrative   One living child. Lost his son at 24 to kidney disease. Daughter lives in [place].   Social Determinants of Health   Financial Resource Strain: Low Risk  (06/20/2023)   Overall Financial Resource Strain (CARDIA)    Difficulty of Paying Living Expenses: Not hard at all  Food Insecurity: No Food Insecurity (06/20/2023)    Hunger Vital Sign    Worried About Running Out of Food in the Last Year: Never true    Ran Out of Food in the Last Year: Never true  Transportation Needs: No Transportation Needs (06/20/2023)   PRAPARE - Administrator, Civil Service (Medical): No    Lack of Transportation (Non-Medical): No  Physical Activity: Sufficiently Active (06/20/2023)   Exercise Vital Sign    Days of Exercise per Week: 5 days    Minutes of Exercise per Session: 30 min  Stress: No Stress Concern Present (06/14/2021)   [employer] of Occupational Health - Occupational Stress Questionnaire    Feeling of Stress : Not at all  Social Connections: Moderately Isolated (11/15/2021)   Social Connection and Isolation Panel [NHANES]    Frequency of Communication with Friends and Family: More than three times a week    Frequency of Social Gatherings with Friends and Family: More than three times a week    Attends Religious Services: 1 to 4 times per year    Active Member of [employer] or Organizations: No    Attends Banker Meetings: Never    Marital Status: Widowed     Family History: The patient's family history includes Cancer in his father; Heart disease in his mother; Lung cancer in his maternal uncle and maternal uncle; Lymphoma in his father. There is no history of Breast cancer, Colon cancer, Pancreatic cancer, or Prostate cancer. ROS:   Please see the history of present illness.    All 14 point review of systems negative except as described per history of present illness.  EKGs/Labs/Other Studies Reviewed:    The following studies were reviewed today:   EKG:  EKG Interpretation Date/Time:  Tuesday August 12 2023 10:09:48 EDT Ventricular Rate:  69 PR Interval:  216 QRS Duration:  72 QT Interval:  348 QTC Calculation: 372 R Axis:   46  Text Interpretation: Sinus rhythm with sinus arrhythmia with 1st degree A-V block Low voltage QRS Septal infarct , age undetermined No previous ECGs  available Confirmed by Gypsy Balsam [id number]) on 08/12/2023 10:19:45 AM    Recent Labs: 05/19/2023: ALT 16; BUN 17; Creatinine, Ser 0.96; Potassium 4.6; Sodium 136  Recent Lipid Panel    Component Value Date/Time   CHOL 155 05/19/2023 0952   TRIG 54.0 05/19/2023 0952   HDL 68.50 05/19/2023 0952   CHOLHDL 2 05/19/2023 0952   VLDL 10.8 05/19/2023 0952   LDLCALC 76 05/19/2023 0952   LDLCALC 85 09/06/2020 1000    Physical Exam:    VS:  BP 118/68 (BP Location: Left Arm, Patient Position:  Sitting)   Pulse 74   Ht 5\' 8"  (1.727 m)   Wt 172 lb (78 kg)   SpO2 97%   BMI 26.15 kg/m     Wt Readings from Last 3 Encounters:  08/12/23 172 lb (78 kg)  05/19/23 174 lb (78.9 kg)  09/10/22 174 lb 3.2 oz (79 kg)     GEN:  Well nourished, well developed in no acute distress HEENT: Normal NECK: No JVD; No carotid bruits LYMPHATICS: No lymphadenopathy CARDIAC: RRR, no murmurs, no rubs, no gallops RESPIRATORY:  Clear to auscultation without rales, wheezing or rhonchi  ABDOMEN: Soft, non-tender, non-distended MUSCULOSKELETAL:  No edema; No deformity  SKIN: Warm and dry NEUROLOGIC:  Alert and oriented x 3 PSYCHIATRIC:  Normal affect   ASSESSMENT:    1. Essential hypertension   2. Dyspnea on exertion   3. Malignant neoplasm of prostate (HCC)   4. Mixed hyperlipidemia    PLAN:    In order of problems listed above:  PVCs noted on the EKG.  He does not feel them.  Will schedule him to have Zio patch for 2 weeks to see what the burden of arrhythmia is.  As a part of her evaluation and stratification I will schedule him to have echocardiogram done to assess left ventricle ejection fraction.  EKG showing also poor R wave progression anterior precordial try suspicion for anterior septal wall MI.  Will recheck echocardiogram for that. Overall risk stratification he is 82 years old but have to admit he looks like in the very good shape.  I will ask him to have a calcium score done to see if we  need to intensify therapy for his cholesterolemia. History of prostate cancer status post radiotherapy doing well from that point review. Mixed dyslipidemia I did review K PN which show me LDL 76 HDL 68 this is on Zocor 20 which I will continue for now.   Medication Adjustments/Labs and Tests Ordered: Current medicines are reviewed at length with the patient today.  Concerns regarding medicines are outlined above.  Orders Placed This Encounter  Procedures   EKG 12-Lead   No orders of the defined types were placed in this encounter.   Signed, Georgeanna Lea, MD, [facility]. 08/12/2023 10:38 AM    [facility]

## 2023-08-12 NOTE — Addendum Note (Signed)
Addended by: Baldo Ash D on: 08/12/2023 11:29 AM   Modules accepted: Orders

## 2023-08-12 NOTE — Patient Instructions (Signed)
Medication Instructions:  Your physician recommends that you continue on your current medications as directed. Please refer to the Current Medication list given to you today.  *If you need a refill on your cardiac medications before your next appointment, please call your pharmacy*   Lab Work: None Ordered If you have labs (blood work) drawn today and your tests are completely normal, you will receive your results only by: MyChart Message (if you have MyChart) OR A paper copy in the mail If you have any lab test that is abnormal or we need to change your treatment, we will call you to review the results.   Testing/Procedures: Your physician has requested that you have an echocardiogram. Echocardiography is a painless test that uses sound waves to create images of your heart. It provides your doctor with information about the size and shape of your heart and how well your heart's chambers and valves are working. This procedure takes approximately one hour. There are no restrictions for this procedure. Please do NOT wear cologne, perfume, aftershave, or lotions (deodorant is allowed). Please arrive 15 minutes prior to your appointment time.   WHY IS MY DOCTOR PRESCRIBING ZIO? The Zio system is proven and trusted by physicians to detect and diagnose irregular heart rhythms -- and has been prescribed to hundreds of thousands of patients.  The FDA has cleared the Zio system to monitor for many different kinds of irregular heart rhythms. In a study, physicians were able to reach a diagnosis 90% of the time with the Zio system1.  You can wear the Zio monitor -- a small, discreet, comfortable patch -- during your normal day-to-day activity, including while you sleep, shower, and exercise, while it records every single heartbeat for analysis.  1Barrett, P., et al. Comparison of 24 Hour Holter Monitoring Versus 14 Day Novel Adhesive Patch Electrocardiographic Monitoring. American Journal of Medicine,  2014.  ZIO VS. HOLTER MONITORING The Zio monitor can be comfortably worn for up to 14 days. Holter monitors can be worn for 24 to 48 hours, limiting the time to record any irregular heart rhythms you may have. Zio is able to capture data for the 51% of patients who have their first symptom-triggered arrhythmia after 48 hours.1  LIVE WITHOUT RESTRICTIONS The Zio ambulatory cardiac monitor is a small, unobtrusive, and water-resistant patch--you might even forget you're wearing it. The Zio monitor records and stores every beat of your heart, whether you're sleeping, working out, or showering.    We will order CT coronary calcium score. It will cost $99.00 and is not covered by insurance.  Please call to schedule.    [facility] [address] [place], [place] [address] [phone]      Follow-Up: At [facility], you and your health needs are our priority.  As part of our continuing mission to provide you with exceptional heart care, we have created designated Provider Care Teams.  These Care Teams include your primary Cardiologist (physician) and Advanced Practice Providers (APPs -  Physician Assistants and Nurse Practitioners) who all work together to provide you with the care you need, when you need it.  We recommend signing up for the patient portal called "MyChart".  Sign up information is provided on this After Visit Summary.  MyChart is used to connect with patients for Virtual Visits (Telemedicine).  Patients are able to view lab/test results, encounter notes, upcoming appointments, etc.  Non-urgent messages can be sent to your provider as well.   To learn  more about what you can do with MyChart, go to [email or web address].    Your next appointment:   2 month(s)  The format for your next appointment:   In Person  Provider:   Gypsy Balsam, MD    Other Instructions NA

## 2023-08-19 ENCOUNTER — Ambulatory Visit: Payer: Medicare HMO | Admitting: Medical

## 2023-08-21 ENCOUNTER — Ambulatory Visit (HOSPITAL_BASED_OUTPATIENT_CLINIC_OR_DEPARTMENT_OTHER)
Admission: RE | Admit: 2023-08-21 | Discharge: 2023-08-21 | Disposition: A | Discharge status: Home / Self Care | Payer: Medicare HMO | Source: Ambulatory Visit | Attending: Cardiology | Admitting: Cardiology

## 2023-08-21 ENCOUNTER — Ambulatory Visit: Payer: Medicare HMO | Admitting: Medical

## 2023-08-21 DIAGNOSIS — Z8249 Family history of ischemic heart disease and other diseases of the circulatory system: Secondary | ICD-10-CM | POA: Insufficient documentation

## 2023-09-03 DIAGNOSIS — R0609 Other forms of dyspnea: Secondary | ICD-10-CM | POA: Diagnosis not present

## 2023-09-11 ENCOUNTER — Telehealth: Payer: Self-pay

## 2023-09-11 NOTE — Telephone Encounter (Signed)
CT Score Results reviewed with pt as per Dr. Vanetta Shawl note.  Pt verbalized understanding and had no additional questions. Routed to PCP

## 2023-09-12 NOTE — Telephone Encounter (Signed)
Patient calling back with some questions. Please advise

## 2023-09-12 NOTE — Telephone Encounter (Signed)
Spoke with pt regarding calcium score. Pt stated that he mailed in his monitor in and will have his Echo next week. Pt had no further questions.

## 2023-09-15 ENCOUNTER — Ambulatory Visit (HOSPITAL_BASED_OUTPATIENT_CLINIC_OR_DEPARTMENT_OTHER)
Admission: RE | Admit: 2023-09-15 | Discharge: 2023-09-15 | Disposition: A | Discharge status: Home / Self Care | Payer: Medicare HMO | Source: Ambulatory Visit | Attending: Cardiology | Admitting: Cardiology

## 2023-09-15 DIAGNOSIS — I5030 Unspecified diastolic (congestive) heart failure: Secondary | ICD-10-CM | POA: Diagnosis not present

## 2023-09-15 DIAGNOSIS — R0609 Other forms of dyspnea: Secondary | ICD-10-CM | POA: Insufficient documentation

## 2023-09-15 DIAGNOSIS — I083 Combined rheumatic disorders of mitral, aortic and tricuspid valves: Secondary | ICD-10-CM

## 2023-09-15 LAB — ECHOCARDIOGRAM COMPLETE
AR max vel: 1.7 cm2
AV Area VTI: 1.86 cm2
AV Area mean vel: 1.85 cm2
AV Mean grad: 6 mmHg
AV Peak grad: 10.8 mmHg
AV Vena cont: 0.2 cm
Ao pk vel: 1.64 m/s
Area-P 1/2: 3.27 cm2
Calc EF: 58.1 %
MV M vel: 2.68 m/s
MV Peak grad: 28.6 mmHg
P 1/2 time: 1537 ms
S' Lateral: 2.8 cm
Single Plane A2C EF: 57.9 %
Single Plane A4C EF: 57.9 %

## 2023-09-16 ENCOUNTER — Telehealth: Payer: Self-pay

## 2023-09-16 NOTE — Telephone Encounter (Signed)
LM to return my call. 

## 2023-09-16 NOTE — Telephone Encounter (Signed)
-----   Message from Gypsy Balsam sent at 09/16/2023  2:54 PM EDT ----- Echocardiogram showed preserved ejection fraction, mild mitral valve regurgitation.  Overall looks fine

## 2023-09-17 ENCOUNTER — Ambulatory Visit: Payer: Medicare HMO | Admitting: Medical

## 2023-09-17 ENCOUNTER — Telehealth: Payer: Self-pay

## 2023-09-17 ENCOUNTER — Telehealth: Payer: Self-pay | Admitting: Cardiology

## 2023-09-17 NOTE — Telephone Encounter (Signed)
Results reviewed with pt as per Dr. Vanetta Shawl note.  Pt verbalized understanding and had no additional questions. Routed to PCP

## 2023-09-17 NOTE — Telephone Encounter (Signed)
Patient is returning phone call in regards to Echo results.

## 2023-09-24 ENCOUNTER — Ambulatory Visit: Payer: Medicare HMO | Admitting: Medical

## 2023-09-24 ENCOUNTER — Encounter: Payer: Self-pay | Admitting: Medical

## 2023-09-24 VITALS — BP 110/68 | HR 75 | Temp 98.1°F | Resp 16 | Ht 68.0 in | Wt 167.6 lb

## 2023-09-24 DIAGNOSIS — I493 Ventricular premature depolarization: Secondary | ICD-10-CM

## 2023-09-24 DIAGNOSIS — J45909 Unspecified asthma, uncomplicated: Secondary | ICD-10-CM

## 2023-09-24 DIAGNOSIS — J948 Other specified pleural conditions: Secondary | ICD-10-CM

## 2023-09-24 DIAGNOSIS — J302 Other seasonal allergic rhinitis: Secondary | ICD-10-CM

## 2023-09-24 DIAGNOSIS — Z77090 Contact with and (suspected) exposure to asbestos: Secondary | ICD-10-CM

## 2023-09-24 MED ORDER — ALBUTEROL SULFATE HFA 108 (90 BASE) MCG/ACT IN AERS: INHALATION_SPRAY | RESPIRATORY_TRACT | 2 refills | Status: DC

## 2023-09-24 MED ORDER — LEVOCETIRIZINE DIHYDROCHLORIDE 5 MG PO TABS: 5.0000 mg | ORAL_TABLET | Freq: Every evening | ORAL | 3 refills | Status: DC

## 2023-09-24 MED ORDER — FLUTICASONE PROPIONATE 50 MCG/ACT NA SUSP: 2.0000 | Freq: Every day | NASAL | 1 refills | Status: DC

## 2023-09-24 NOTE — Patient Instructions (Signed)
1. Asthma due to seasonal allergies -refilled albuterol inhaler.  2. Seasonal allergic rhinitis, unspecified trigger -flonase and xyal rx.  3. Pleural calcification(plaque) with hx of asbestos exposure - CT Chest W Contrast; Future  4. History of asbestos exposure -offered referral to pulmonologist for pleural plaque and hx of exposure to asbestosReferral declined but do want to get ct study radiology recommended - CT Chest W Contrast; Future  5. PVC (premature ventricular contraction)  - with asymptomatic pvc I don't think cardiology will treat. Review ct coronary study with Dr. Kirtland Bouchard.  Follow up in 6 months or sooner if needed.

## 2023-09-24 NOTE — Progress Notes (Signed)
Subjective:    Patient ID: Donald Porter, male    DOB: [birth year], 82 y.o.   MRN: [id number]  HPI  Pt in for follow up.  Pt went to Dr. Dewayne Shorter.   "PVCs noted on the EKG.  He does not feel them.  Will schedule him to have Zio patch for 2 weeks to see what the burden of arrhythmia is.  As a part of her evaluation and stratification I will schedule him to have echocardiogram done to assess left ventricle ejection fraction.  EKG showing also poor R wave progression anterior precordial try suspicion for anterior septal wall MI.  Will recheck echocardiogram for that. Overall risk stratification he is 82 years old but have to admit he looks like in the very good shape.  I will ask him to have a calcium score done to see if we need to intensify therapy for his cholesterolemia. History of prostate cancer status post radiotherapy doing well from that point review. Mixed dyslipidemia I did review K PN which show me LDL 76 HDL 68 this is on Zocor 20 which I will continue for now."  Pt has follow up with Dr. Kirtland Bouchard Nov 01, 2023.  Referred to cardiology for below.   Family history of MI. Mom in 12's. On discussion with htn, high cholesterol and asymptomatic pvc. EKG machine read old infarct. Pt gave no hx of this and EKG done for screening purposes. PVC on EKG but asymptomatic. On my note I don't see that initial plan was to refer but remember educating on aysmptomatic pvc. Pt got impression I would refer to cardiologist. In light of age and risk factors reasonable. Can pt be seen within a week or so? Can pt see Dr. Dewayne Shorter.    Ct coronary comments. Extensive pleural plaques with calcifications and soft tissue nodularity. Contrast-enhanced CT of the chest recommended for further evaluation.  On review pt did smoke when younger for 5 years. 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 Some shortness of breath at night and in dusty environment. Hx of  asthma.   Pt states he is still getting short of breath when he walks. Pt states allergic to dust and pollen. Recently helping sister with yard sale and he started to cough and wheeze. Pt used albuterol and it helped some but states 82 year old inhaler.  Also some seasonal allergy. Pt states not every year with some nasal congestion and runny nose.      Review of Systems  Constitutional:  Negative for chills and fever.  HENT:  Negative for congestion and postnasal drip.   Respiratory:  Positive for shortness of breath and wheezing. Negative for choking.        See hpi for details.  Cardiovascular:  Negative for chest pain and palpitations.  Gastrointestinal:  Negative for abdominal pain.  Musculoskeletal:  Negative for back pain.  Skin:  Negative for rash.  Neurological:  Negative for dizziness, weakness and light-headedness.  Hematological:  Negative for adenopathy. Does not bruise/bleed easily.  Psychiatric/Behavioral:  Negative for behavioral problems and decreased concentration. The patient is not nervous/anxious.     Past Medical History:  Diagnosis Date   Allergy    Asthma    1988   GERD (gastroesophageal reflux disease)    Hyperlipidemia 04/26/2015   Hypertension    Malignant neoplasm of prostate (HCC) 05/02/2020   Nuclear sclerotic cataract of left eye 08/26/2017   Prostate cancer (HCC)  Social History   Socioeconomic History   Marital status: Widowed    Spouse name: Not on file   Number of children: 2   Years of education: Not on file   Highest education level: Not on file  Occupational History   Occupation: accountant    Comment: works part time  Tobacco Use   Smoking status: Never   Smokeless tobacco: Never  Vaping Use   Vaping status: Never Used  Substance and Sexual Activity   Alcohol use: Yes    Alcohol/week: 2.0 standard drinks of alcohol    Types: 2 Glasses of wine per week    Comment: 1-2 glasses of wine 4-5 days per week   Drug use: Never    Sexual activity: Not Currently    Comment: widowed in 2018 after 57 years of marriage.  Other Topics Concern   Not on file  Social History Narrative   One living child. Lost his son at 69 to kidney disease. Daughter lives in [place].   Social Determinants of Health   Financial Resource Strain: Low Risk  (06/20/2023)   Overall Financial Resource Strain (CARDIA)    Difficulty of Paying Living Expenses: Not hard at all  Food Insecurity: No Food Insecurity (06/20/2023)   Hunger Vital Sign    Worried About Running Out of Food in the Last Year: Never true    Ran Out of Food in the Last Year: Never true  Transportation Needs: No Transportation Needs (06/20/2023)   PRAPARE - Administrator, Civil Service (Medical): No    Lack of Transportation (Non-Medical): No  Physical Activity: Sufficiently Active (06/20/2023)   Exercise Vital Sign    Days of Exercise per Week: 5 days    Minutes of Exercise per Session: 30 min  Stress: No Stress Concern Present (06/14/2021)   [employer] of Occupational Health - Occupational Stress Questionnaire    Feeling of Stress : Not at all  Social Connections: Moderately Isolated (11/15/2021)   Social Connection and Isolation Panel [NHANES]    Frequency of Communication with Friends and Family: More than three times a week    Frequency of Social Gatherings with Friends and Family: More than three times a week    Attends Religious Services: 1 to 4 times per year    Active Member of [employer] or Organizations: No    Attends Banker Meetings: Never    Marital Status: Widowed  Intimate Partner Violence: Not At Risk (06/20/2023)   Humiliation, Afraid, Rape, and Kick questionnaire    Fear of Current or Ex-Partner: No    Emotionally Abused: No    Physically Abused: No    Sexually Abused: No    Past Surgical History:  Procedure Laterality Date   EYE SURGERY     Age 82   HERNIA REPAIR     3 surgeries   PROSTATE BIOPSY      Family  History  Problem Relation Age of Onset   Heart disease Mother    Lymphoma Father    Cancer Father    Lung cancer Maternal Uncle    Lung cancer Maternal Uncle    Breast cancer Neg Hx    Colon cancer Neg Hx    Pancreatic cancer Neg Hx    Prostate cancer Neg Hx     No Known Allergies  Current Outpatient Medications on File Prior to Visit  Medication Sig Dispense Refill   amLODipine (NORVASC) 5 MG tablet TAKE 1 TABLET (5 MG TOTAL) BY  MOUTH DAILY. 90 tablet 1   lisinopril (ZESTRIL) 10 MG tablet TAKE 2 TABLETS BY MOUTH EVERY DAY (Patient taking differently: Take 10 mg by mouth daily.) 180 tablet 1   Multiple Vitamins-Minerals (MENS MULTIVITAMIN PLUS) TABS Take 1 tablet by mouth daily.     simvastatin (ZOCOR) 20 MG tablet TAKE 1 TABLET BY MOUTH EVERYDAY AT BEDTIME (Patient taking differently: Take 20 mg by mouth daily at 6 PM. TAKE 1 TABLET BY MOUTH EVERYDAY AT BEDTIME) 90 tablet 1   tamsulosin (FLOMAX) 0.4 MG CAPS capsule TAKE 1 CAPSULE BY MOUTH TWICE A DAY 180 capsule 3   No current facility-administered medications on file prior to visit.    BP 110/68 (BP Location: Left Arm, Patient Position: Sitting, Cuff Size: Normal)   Pulse 75   Temp 98.1 F (36.7 C) (Oral)   Resp 16   Ht 5\' 8"  (1.727 m)   Wt 167 lb 9.6 oz (76 kg)   SpO2 96%   BMI 25.48 kg/m        Objective:   Physical Exam  General Mental Status- Alert. General Appearance- Not in acute distress.   Skin General: Color- Normal Color. Moisture- Normal Moisture.  Neck Carotid Arteries- Normal color. Moisture- Normal Moisture. No carotid bruits. No JVD.  Chest and Lung Exam Auscultation: Breath Sounds:-Normal.  Cardiovascular Auscultation:Rythm- Regular. Murmurs & Other Heart Sounds:Auscultation of the heart reveals- No Murmurs.  Abdomen Inspection:-Inspeection Normal. Palpation/Percussion:Note:No mass. Palpation and Percussion of the abdomen reveal- Non Tender, Non Distended + BS, no rebound or  guarding.   Neurologic Cranial Nerve exam:- CN III-XII intact(No nystagmus), symmetric smile. Strength:- 5/5 equal and symmetric strength both upper and lower extremities.   Lower ext- no pedal edema. Calfs symmetric.     Assessment & Plan:   Patient Instructions  1. Asthma due to seasonal allergies -refilled albuterol inhaler.  2. Seasonal allergic rhinitis, unspecified trigger -flonase and xyal rx.  3. Pleural calcification(plaque) with hx of asbestos exposure - CT Chest W Contrast; Future  4. History of asbestos exposure -offered referral to pulmonologist for pleural plaque and hx of exposure to asbestosReferral declined but do want to get ct study radiology recommended - CT Chest W Contrast; Future  5. PVC (premature ventricular contraction)  - with asymptomatic pvc I don't think cardiology will treat. Review ct coronary study with Dr. Kirtland Bouchard.  Follow up in 6 months or sooner if needed.   Esperanza Richters, PA-C   Time spent with patient today was 44  minutes which consisted of chart review, discussing diagnosis, work up treatment and documentation. Particularly discussed pleural plaques and hx of asbestos exposure. Reason had wanted to refer to pulmonlogist but declined. Then went ahead and ordre ct chest

## 2023-09-26 ENCOUNTER — Telehealth: Payer: Self-pay

## 2023-09-26 NOTE — Telephone Encounter (Signed)
LVM for pt to call regarding Monitor results

## 2023-10-01 NOTE — Telephone Encounter (Signed)
Results reviewed with pt as per Dr. Vanetta Shawl note.  Pt verbalized understanding and had no additional questions. Routed to PCP   Monitor to episode ventricular tachycardia, will talk about this during the visit

## 2023-10-01 NOTE — Telephone Encounter (Signed)
Patient is returning call and is requesting return call.

## 2023-10-02 ENCOUNTER — Other Ambulatory Visit: Payer: Self-pay | Admitting: Medical

## 2023-10-15 ENCOUNTER — Ambulatory Visit (HOSPITAL_BASED_OUTPATIENT_CLINIC_OR_DEPARTMENT_OTHER)
Admission: RE | Admit: 2023-10-15 | Discharge: 2023-10-15 | Disposition: A | Discharge status: Home / Self Care | Payer: Medicare HMO | Source: Ambulatory Visit | Attending: Medical | Admitting: Medical

## 2023-10-15 ENCOUNTER — Encounter (HOSPITAL_BASED_OUTPATIENT_CLINIC_OR_DEPARTMENT_OTHER): Payer: Self-pay

## 2023-10-15 DIAGNOSIS — Z77090 Contact with and (suspected) exposure to asbestos: Secondary | ICD-10-CM | POA: Diagnosis not present

## 2023-10-15 DIAGNOSIS — J929 Pleural plaque without asbestos: Secondary | ICD-10-CM | POA: Diagnosis not present

## 2023-10-15 DIAGNOSIS — J948 Other specified pleural conditions: Secondary | ICD-10-CM | POA: Diagnosis not present

## 2023-10-15 DIAGNOSIS — K449 Diaphragmatic hernia without obstruction or gangrene: Secondary | ICD-10-CM | POA: Diagnosis not present

## 2023-10-15 MED ORDER — IOHEXOL 300 MG/ML  SOLN
100.0000 mL | Freq: Once | INTRAMUSCULAR | Status: AC | PRN
  Administered 2023-10-15: 80 mL via INTRAVENOUS

## 2023-10-17 ENCOUNTER — Other Ambulatory Visit: Payer: Self-pay | Admitting: Medical

## 2023-11-04 ENCOUNTER — Ambulatory Visit: Payer: Medicare HMO | Attending: Cardiology | Admitting: Cardiology

## 2023-11-04 ENCOUNTER — Encounter: Payer: Self-pay | Admitting: Cardiology

## 2023-11-04 VITALS — BP 110/60 | HR 78 | Ht 68.0 in | Wt 170.0 lb

## 2023-11-04 DIAGNOSIS — I472 Ventricular tachycardia, unspecified: Secondary | ICD-10-CM | POA: Insufficient documentation

## 2023-11-04 DIAGNOSIS — R0609 Other forms of dyspnea: Secondary | ICD-10-CM | POA: Diagnosis not present

## 2023-11-04 DIAGNOSIS — E782 Mixed hyperlipidemia: Secondary | ICD-10-CM | POA: Diagnosis not present

## 2023-11-04 MED ORDER — SIMVASTATIN 40 MG PO TABS: 40.0000 mg | ORAL_TABLET | Freq: Every day | ORAL | 3 refills | Status: DC

## 2023-11-04 NOTE — Progress Notes (Signed)
Cardiology Office Note:    Date:  11/04/2023   ID:  Donald Porter, DOB 17-Feb-1941, MRN [id number]  PCP:  Esperanza Richters, PA-C  Cardiologist:  Gypsy Balsam, MD    Referring MD: Esperanza Richters, [place]   Chief Complaint  Patient presents with   Follow-up    History of Present Illness:    Donald Porter is a 82 y.o. male with past medical history significant for essential hypertension, dyslipidemia, he was referred to [place] because of PVCs.  Monitor showed runs of nonsustained ventricular tachycardia with the longest episode 15 beats, asymptomatic.  Comes today to months to discuss this.  Overall doing very well trying to be active no dizziness no passing out.  Interestingly he thinks he gets allergies when he walks he gets short of breath and sometimes tightness in the chest.  He blames this on allergies some medication to help with that.  Past Medical History:  Diagnosis Date   Allergy    Asthma    1988   GERD (gastroesophageal reflux disease)    Hyperlipidemia 04/26/2015   Hypertension    Malignant neoplasm of prostate (HCC) 05/02/2020   Nuclear sclerotic cataract of left eye 08/26/2017   Prostate cancer [facility])     Past Surgical History:  Procedure Laterality Date   EYE SURGERY     Age 4   HERNIA REPAIR     3 surgeries   PROSTATE BIOPSY      Current Medications: Current Meds  Medication Sig   albuterol (PROAIR HFA) 108 (90 Base) MCG/ACT inhaler TAKE 2 PUFFS BY MOUTH EVERY 6 HOURS AS NEEDED FOR WHEEZE OR SHORTNESS OF BREATH (Patient taking differently: Inhale 2 puffs into the lungs every 6 (six) hours as needed for wheezing or shortness of breath. TAKE 2 PUFFS BY MOUTH EVERY 6 HOURS AS NEEDED FOR WHEEZE OR SHORTNESS OF BREATH)   amLODipine (NORVASC) 5 MG tablet TAKE 1 TABLET (5 MG TOTAL) BY MOUTH DAILY.   fluticasone (FLONASE) 50 MCG/ACT nasal spray SPRAY 2 SPRAYS INTO EACH NOSTRIL EVERY DAY (Patient taking differently: Place 2 sprays into both nostrils daily.)    levocetirizine (XYZAL) 5 MG tablet Take 1 tablet (5 mg total) by mouth every evening.   lisinopril (ZESTRIL) 10 MG tablet TAKE 2 TABLETS BY MOUTH EVERY DAY (Patient taking differently: Take 10 mg by mouth daily.)   Multiple Vitamins-Minerals (MENS MULTIVITAMIN PLUS) TABS Take 1 tablet by mouth daily.   simvastatin (ZOCOR) 20 MG tablet TAKE 1 TABLET BY MOUTH EVERYDAY AT BEDTIME (Patient taking differently: Take 20 mg by mouth daily at 6 PM. TAKE 1 TABLET BY MOUTH EVERYDAY AT BEDTIME)   tamsulosin (FLOMAX) 0.4 MG CAPS capsule TAKE 1 CAPSULE BY MOUTH TWICE A DAY     Allergies:   Patient has no known allergies.   Social History   Socioeconomic History   Marital status: Widowed    Spouse name: Not on file   Number of children: 2   Years of education: Not on file   Highest education level: Not on file  Occupational History   Occupation: accountant    Comment: works part time  Tobacco Use   Smoking status: Never   Smokeless tobacco: Never  Vaping Use   Vaping status: Never Used  Substance and Sexual Activity   Alcohol use: Yes    Alcohol/week: 2.0 standard drinks of alcohol    Types: 2 Glasses of wine per week    Comment: 1-2 glasses of wine 4-5 days per  week   Drug use: Never   Sexual activity: Not Currently    Comment: widowed in 2018 after 57 years of marriage.  Other Topics Concern   Not on file  Social History Narrative   One living child. Lost his son at 85 to kidney disease. Daughter lives in [place].   Social Determinants of Health   Financial Resource Strain: Low Risk  (06/20/2023)   Overall Financial Resource Strain (CARDIA)    Difficulty of Paying Living Expenses: Not hard at all  Food Insecurity: No Food Insecurity (06/20/2023)   Hunger Vital Sign    Worried About Running Out of Food in the Last Year: Never true    Ran Out of Food in the Last Year: Never true  Transportation Needs: No Transportation Needs (06/20/2023)   PRAPARE - Scientist, research (physical sciences) (Medical): No    Lack of Transportation (Non-Medical): No  Physical Activity: Sufficiently Active (06/20/2023)   Exercise Vital Sign    Days of Exercise per Week: 5 days    Minutes of Exercise per Session: 30 min  Stress: No Stress Concern Present (06/14/2021)   [employer] of Occupational Health - Occupational Stress Questionnaire    Feeling of Stress : Not at all  Social Connections: Moderately Isolated (11/15/2021)   Social Connection and Isolation Panel [NHANES]    Frequency of Communication with Friends and Family: More than three times a week    Frequency of Social Gatherings with Friends and Family: More than three times a week    Attends Religious Services: 1 to 4 times per year    Active Member of [employer] or Organizations: No    Attends Banker Meetings: Never    Marital Status: Widowed     Family History: The patient's family history includes Cancer in his father; Heart disease in his mother; Lung cancer in his maternal uncle and maternal uncle; Lymphoma in his father. There is no history of Breast cancer, Colon cancer, Pancreatic cancer, or Prostate cancer. ROS:   Please see the history of present illness.    All 14 point review of systems negative except as described per history of present illness  EKGs/Labs/Other Studies Reviewed:         Recent Labs: 05/19/2023: ALT 16; BUN 17; Creatinine, Ser 0.96; Potassium 4.6; Sodium 136  Recent Lipid Panel    Component Value Date/Time   CHOL 155 05/19/2023 0952   TRIG 54.0 05/19/2023 0952   HDL 68.50 05/19/2023 0952   CHOLHDL 2 05/19/2023 0952   VLDL 10.8 05/19/2023 0952   LDLCALC 76 05/19/2023 0952   LDLCALC 85 09/06/2020 1000    Physical Exam:    VS:  BP 110/60 (BP Location: Left Arm, Patient Position: Sitting)   Pulse 78   Ht 5\' 8"  (1.727 m)   Wt 170 lb (77.1 kg)   SpO2 98%   BMI 25.85 kg/m     Wt Readings from Last 3 Encounters:  11/04/23 170 lb (77.1 kg)  09/24/23 167 lb 9.6  oz (76 kg)  08/12/23 172 lb (78 kg)     GEN:  Well nourished, well developed in no acute distress HEENT: Normal NECK: No JVD; No carotid bruits LYMPHATICS: No lymphadenopathy CARDIAC: RRR, no murmurs, no rubs, no gallops RESPIRATORY:  Clear to auscultation without rales, wheezing or rhonchi  ABDOMEN: Soft, non-tender, non-distended MUSCULOSKELETAL:  No edema; No deformity  SKIN: Warm and dry LOWER EXTREMITIES: no swelling NEUROLOGIC:  Alert and oriented x 3  PSYCHIATRIC:  Normal affect   ASSESSMENT:    1. Ventricular tachycardia (HCC)   2. Mixed hyperlipidemia   3. Dyspnea on exertion    PLAN:    In order of problems listed above:  Ventricular tachycardia.  Stratification in included echocardiogram which has been already done showed preserved ejection fraction.  In terms of evaluation for coronary artery disease he did have calcium score which is more than 2000, he does have some symptoms that make me concerned.  Will initiate aspirin every single day, I will schedule him to have a stress test.  Based on that we will decide about therapy.  Poor candidate for beta-blocker because he does have first-degree type second-degree type I AV block during the night. Mixed dyslipidemia I did review K PN which show me his LDL of 76 HDL 65.  He is taking Zocor 20 we will increase to 40.  Target LDL should be less than 70. Dyspnea on exertion again echocardiogram preserved ejection fraction, diastolic dysfunction, we will do CAD workup   Medication Adjustments/Labs and Tests Ordered: Current medicines are reviewed at length with the patient today.  Concerns regarding medicines are outlined above.  No orders of the defined types were placed in this encounter.  Medication changes: No orders of the defined types were placed in this encounter.   Signed, Georgeanna Lea, MD, [facility] 11/04/2023 10:30 AM    [facility]

## 2023-11-04 NOTE — Addendum Note (Signed)
Addended by: Baldo Ash D on: 11/04/2023 10:49 AM   Modules accepted: Orders

## 2023-11-04 NOTE — Patient Instructions (Addendum)
Medication Instructions:   INCREASE: Simvastatin to 40mg  daily- you may double your current dose and your next refill will reflect the new dose  TAKE: Aspirin 81mg  1 daily  Lab Work: 3rd Floor   [address]  Your physician recommends that you return for lab work in:   6 weeks You need to have labs done when you are fasting.  You can come Monday through Friday 8:00 am to 11:30AM and 1:00 to 4:00. You do not need to make an appointment as the order has already been placed.        Testing/Procedures: Your physician has requested that you have a Exercise Cardiolite. For further information please visit [email or web address] Please follow instruction sheet, as given.  The test will take approximately 3 to 4 hours to complete; you may bring reading material.  If someone comes with you to your appointment, they will need to remain in the main lobby due to limited space in the testing area. **If you are pregnant or breastfeeding, please notify the nuclear lab prior to your appointment**  How to prepare for your Myocardial Perfusion Test: Do not eat or drink 3 hours prior to your test, except you may have water. Do not consume products containing caffeine (regular or decaffeinated) 12 hours prior to your test. (ex: coffee, chocolate, sodas, tea). Do bring a list of your current medications with you.  If not listed below, you may take your medications as normal. Do wear comfortable clothes (no dresses or overalls) and walking shoes, tennis shoes preferred (No heels or open toe shoes are allowed). Do NOT wear cologne, perfume, aftershave, or lotions (deodorant is allowed). If these instructions are not followed, your test will have to be rescheduled.     Follow-Up: At [facility], you and your health needs are our priority.  As part of our continuing mission to provide you with exceptional heart care, we have created designated Provider Care Teams.  These Care Teams include your primary Cardiologist  (physician) and Advanced Practice Providers (APPs -  Physician Assistants and Nurse Practitioners) who all work together to provide you with the care you need, when you need it.  We recommend signing up for the patient portal called "MyChart".  Sign up information is provided on this After Visit Summary.  MyChart is used to connect with patients for Virtual Visits (Telemedicine).  Patients are able to view lab/test results, encounter notes, upcoming appointments, etc.  Non-urgent messages can be sent to your provider as well.   To learn more about what you can do with MyChart, go to [email or web address].    Your next appointment:   4 month(s)  The format for your next appointment:   In Person  Provider:   Gypsy Balsam, MD    Other Instructions NA

## 2023-11-04 NOTE — Addendum Note (Signed)
Addended by: Baldo Ash D on: 11/04/2023 11:32 AM   Modules accepted: Orders

## 2023-11-12 NOTE — Addendum Note (Signed)
Addended by: Gypsy Balsam on: 11/12/2023 09:23 AM   Modules accepted: Orders

## 2023-11-14 ENCOUNTER — Other Ambulatory Visit: Payer: Self-pay | Admitting: Medical

## 2023-11-20 ENCOUNTER — Encounter (HOSPITAL_COMMUNITY): Payer: Medicare HMO

## 2023-12-02 ENCOUNTER — Encounter (HOSPITAL_COMMUNITY): Payer: Medicare HMO

## 2023-12-21 ENCOUNTER — Other Ambulatory Visit: Payer: Self-pay | Admitting: Medical

## 2024-01-06 ENCOUNTER — Telehealth: Payer: Self-pay

## 2024-01-06 NOTE — Telephone Encounter (Signed)
 Detailed instructions left on the patient's answering machine. S.Jassiel Flye CCT

## 2024-01-13 ENCOUNTER — Encounter (HOSPITAL_COMMUNITY): Payer: Medicare HMO

## 2024-02-24 ENCOUNTER — Telehealth: Payer: Self-pay

## 2024-02-24 NOTE — Telephone Encounter (Signed)
 Spoke to the patient, detailed instructions given. Asked to call back with any questions. S.Fowler Antos CCT

## 2024-02-25 ENCOUNTER — Other Ambulatory Visit: Payer: Self-pay | Admitting: Medical

## 2024-02-26 DIAGNOSIS — R351 Nocturia: Secondary | ICD-10-CM | POA: Diagnosis not present

## 2024-02-26 DIAGNOSIS — C61 Malignant neoplasm of prostate: Secondary | ICD-10-CM | POA: Diagnosis not present

## 2024-03-02 ENCOUNTER — Ambulatory Visit (HOSPITAL_COMMUNITY): Payer: Medicare HMO | Attending: Cardiology

## 2024-03-02 DIAGNOSIS — R0609 Other forms of dyspnea: Secondary | ICD-10-CM | POA: Diagnosis not present

## 2024-03-02 DIAGNOSIS — I472 Ventricular tachycardia, unspecified: Secondary | ICD-10-CM

## 2024-03-02 LAB — MYOCARDIAL PERFUSION IMAGING
Angina Index: 0
Duke Treadmill Score: 4
Estimated workload: 4.6
Exercise duration (min): 4 min
LV dias vol: 75 mL (ref 62–150)
LV sys vol: 19 mL
MPHR: 136 {beats}/min
Nuc Stress EF: 74 %
Peak HR: 136 {beats}/min
Percent HR: 100 %
RPE: 20
Rest HR: 70 {beats}/min
Rest Nuclear Isotope Dose: 10.5 mCi
SDS: 1
SRS: 0
SSS: 1
ST Depression (mm): 0 mm
Stress Nuclear Isotope Dose: 31.1 mCi
TID: 0.87

## 2024-03-02 MED ORDER — TECHNETIUM TC 99M TETROFOSMIN IV KIT
31.1000 | PACK | Freq: Once | INTRAVENOUS | Status: AC | PRN
  Administered 2024-03-02: 31.1 via INTRAVENOUS

## 2024-03-02 MED ORDER — TECHNETIUM TC 99M TETROFOSMIN IV KIT
10.5000 | PACK | Freq: Once | INTRAVENOUS | Status: AC | PRN
  Administered 2024-03-02: 10.5 via INTRAVENOUS

## 2024-03-05 ENCOUNTER — Telehealth: Payer: Self-pay

## 2024-03-05 NOTE — Telephone Encounter (Signed)
Results reviewed with pt as per [facility] note.  Pt verbalized understanding and had no additional questions. Routed to PCP.

## 2024-03-17 DIAGNOSIS — E782 Mixed hyperlipidemia: Secondary | ICD-10-CM | POA: Diagnosis not present

## 2024-03-18 LAB — LIPID PANEL
Chol/HDL Ratio: 2.1 ratio (ref 0.0–5.0)
Cholesterol, Total: 153 mg/dL (ref 100–199)
HDL: 72 mg/dL (ref >39)
LDL Chol Calc (NIH): 69 mg/dL (ref 0–99)
Triglycerides: 57 mg/dL (ref 0–149)
VLDL Cholesterol Cal: 12 mg/dL (ref 5–40)

## 2024-03-18 LAB — AST: AST: 22 IU/L (ref 0–40)

## 2024-03-18 LAB — ALT: ALT: 18 IU/L (ref 0–44)

## 2024-03-19 ENCOUNTER — Telehealth: Payer: Self-pay

## 2024-03-19 NOTE — Telephone Encounter (Signed)
 LM informing the patient of results. Ok per [employer]

## 2024-03-19 NOTE — Telephone Encounter (Signed)
-----   Message from Gypsy Balsam sent at 03/19/2024 12:26 PM EDT ----- Cholesterol good, continue present medications

## 2024-03-23 ENCOUNTER — Ambulatory Visit (INDEPENDENT_AMBULATORY_CARE_PROVIDER_SITE_OTHER): Payer: Medicare HMO | Admitting: Medical

## 2024-03-23 VITALS — BP 134/56 | HR 76 | Temp 98.1°F | Resp 18 | Ht 64.0 in | Wt 177.0 lb

## 2024-03-23 DIAGNOSIS — J302 Other seasonal allergic rhinitis: Secondary | ICD-10-CM | POA: Diagnosis not present

## 2024-03-23 DIAGNOSIS — I10 Essential (primary) hypertension: Secondary | ICD-10-CM

## 2024-03-23 DIAGNOSIS — I493 Ventricular premature depolarization: Secondary | ICD-10-CM | POA: Diagnosis not present

## 2024-03-23 DIAGNOSIS — D485 Neoplasm of uncertain behavior of skin: Secondary | ICD-10-CM | POA: Diagnosis not present

## 2024-03-23 DIAGNOSIS — E785 Hyperlipidemia, unspecified: Secondary | ICD-10-CM

## 2024-03-23 DIAGNOSIS — R739 Hyperglycemia, unspecified: Secondary | ICD-10-CM | POA: Diagnosis not present

## 2024-03-23 DIAGNOSIS — L821 Other seborrheic keratosis: Secondary | ICD-10-CM | POA: Diagnosis not present

## 2024-03-23 DIAGNOSIS — M79671 Pain in right foot: Secondary | ICD-10-CM

## 2024-03-23 DIAGNOSIS — D044 Carcinoma in situ of skin of scalp and neck: Secondary | ICD-10-CM | POA: Diagnosis not present

## 2024-03-23 DIAGNOSIS — D1801 Hemangioma of skin and subcutaneous tissue: Secondary | ICD-10-CM | POA: Diagnosis not present

## 2024-03-23 DIAGNOSIS — Z77090 Contact with and (suspected) exposure to asbestos: Secondary | ICD-10-CM

## 2024-03-23 DIAGNOSIS — L570 Actinic keratosis: Secondary | ICD-10-CM | POA: Diagnosis not present

## 2024-03-23 DIAGNOSIS — L820 Inflamed seborrheic keratosis: Secondary | ICD-10-CM | POA: Diagnosis not present

## 2024-03-23 DIAGNOSIS — Z85828 Personal history of other malignant neoplasm of skin: Secondary | ICD-10-CM | POA: Diagnosis not present

## 2024-03-23 DIAGNOSIS — Z129 Encounter for screening for malignant neoplasm, site unspecified: Secondary | ICD-10-CM | POA: Diagnosis not present

## 2024-03-23 NOTE — Progress Notes (Signed)
 Subjective:    Patient ID: Donald Porter, male    DOB: [birth year], 83 y.o.   MRN: [id number]  HPI   Pt in for follow up . Last visit below in sept.  " Patient Instructions  1. Asthma due to seasonal allergies -refilled albuterol inhaler.   2. Seasonal allergic rhinitis, unspecified trigger -flonase and xyal rx.   3. Pleural calcification(plaque) with hx of asbestos exposure - CT Chest W Contrast; Future   4. History of asbestos exposure -offered referral to pulmonologist for pleural plaque and hx of exposure to asbestosReferral declined but do want to get ct study radiology recommended - CT Chest W Contrast; Future   5. PVC (premature ventricular contraction)  - with asymptomatic pvc I don't think cardiology will treat. Review ct coronary study with Dr. Kirtland Bouchard."   Discussed the use of AI scribe software for clinical note transcription with the patient, who gave verbal consent to proceed.  History of Present Illness   Donald Porter is an 83 year old male who presents for follow-up on prior CT findings and cardiovascular evaluation.  He presents for follow-up regarding a CT scan performed due to past asbestos exposure. The CT scan showed diffuse, scattered, calcified plaques without large lymph nodes or pleural effusion, correlating with his history of asbestos exposure. 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 No current respiratory symptoms such as shortness of breath or wheezing.  He is under the care of a cardiologist for asymptomatic premature ventricular contractions (PVCs). He underwent a cardiac CT scoring study and a stress test, both of which showed normal results. Recent blood work, including a cholesterol lipid panel and liver enzymes, was normal. He is currently taking simvastatin 40 mg, which was increased from 20 mg in November, and he has an upcoming follow-up appointment with his  cardiologist. His blood pressure is managed with amlodipine 5 mg and lisinopril 10 mg. Blood pressure is well-controlled.  Seasonal allergies are well-controlled with Flonase nasal spray, and he has not needed to use Xyzal recently. His asthma, which he has had since childhood, is also controlled, and he has albuterol available if needed.  Blood sugar has been slightly elevated, with an average around 118 mg/dL. He is not following a low sugar diet and has not been exercising as much as he should due to a recent ankle injury.  About a week ago, he twisted his ankle while walking, resulting in pain on the right lateral side near the fifth metatarsal. The pain has been improving.  He has not received a flu vaccine this year.           Review of Systems  Constitutional:  Negative for chills, fatigue and fever.  HENT:  Negative for congestion, ear discharge, facial swelling and postnasal drip.   Respiratory:  Negative for cough, chest tightness, shortness of breath and wheezing.   Cardiovascular:  Negative for leg swelling.  Gastrointestinal:  Negative for abdominal pain, blood in stool, constipation, diarrhea, nausea and vomiting.  Genitourinary:  Negative for dysuria.  Musculoskeletal:  Negative for back pain, joint swelling and neck stiffness.  Skin:  Negative for rash.  Neurological:  Negative for dizziness, seizures and light-headedness.  Hematological:  Negative for adenopathy. Does not bruise/bleed easily.  Psychiatric/Behavioral:  Negative for behavioral problems, decreased concentration and dysphoric mood.    Past Medical History:  Diagnosis Date   Allergy    Asthma  1988   GERD (gastroesophageal reflux disease)    Hyperlipidemia 04/26/2015   Hypertension    Malignant neoplasm of prostate (HCC) 05/02/2020   Nuclear sclerotic cataract of left eye 08/26/2017   Prostate cancer [facility])      Social History   Socioeconomic History   Marital status: Widowed    Spouse name: Not  on file   Number of children: 2   Years of education: Not on file   Highest education level: Not on file  Occupational History   Occupation: accountant    Comment: works part time  Tobacco Use   Smoking status: Never   Smokeless tobacco: Never  Vaping Use   Vaping status: Never Used  Substance and Sexual Activity   Alcohol use: Yes    Alcohol/week: 2.0 standard drinks of alcohol    Types: 2 Glasses of wine per week    Comment: 1-2 glasses of wine 4-5 days per week   Drug use: Never   Sexual activity: Not Currently    Comment: widowed in 2018 after 57 years of marriage.  Other Topics Concern   Not on file  Social History Narrative   One living child. Lost his son at 44 to kidney disease. Daughter lives in [place].   Social Drivers of Corporate investment banker Strain: Low Risk  (06/20/2023)   Overall Financial Resource Strain (CARDIA)    Difficulty of Paying Living Expenses: Not hard at all  Food Insecurity: No Food Insecurity (06/20/2023)   Hunger Vital Sign    Worried About Running Out of Food in the Last Year: Never true    Ran Out of Food in the Last Year: Never true  Transportation Needs: No Transportation Needs (06/20/2023)   PRAPARE - Administrator, Civil Service (Medical): No    Lack of Transportation (Non-Medical): No  Physical Activity: Sufficiently Active (06/20/2023)   Exercise Vital Sign    Days of Exercise per Week: 5 days    Minutes of Exercise per Session: 30 min  Stress: No Stress Concern Present (06/14/2021)   [employer] of Occupational Health - Occupational Stress Questionnaire    Feeling of Stress : Not at all  Social Connections: Moderately Isolated (11/15/2021)   Social Connection and Isolation Panel [NHANES]    Frequency of Communication with Friends and Family: More than three times a week    Frequency of Social Gatherings with Friends and Family: More than three times a week    Attends Religious Services: 1 to 4 times per year     Active Member of [employer] or Organizations: No    Attends Banker Meetings: Never    Marital Status: Widowed  Intimate Partner Violence: Not At Risk (06/20/2023)   Humiliation, Afraid, Rape, and Kick questionnaire    Fear of Current or Ex-Partner: No    Emotionally Abused: No    Physically Abused: No    Sexually Abused: No    Past Surgical History:  Procedure Laterality Date   EYE SURGERY     Age 64   HERNIA REPAIR     3 surgeries   PROSTATE BIOPSY      Family History  Problem Relation Age of Onset   Heart disease Mother    Lymphoma Father    Cancer Father    Lung cancer Maternal Uncle    Lung cancer Maternal Uncle    Breast cancer Neg Hx    Colon cancer Neg Hx    Pancreatic cancer Neg  Hx    Prostate cancer Neg Hx     No Known Allergies  Current Outpatient Medications on File Prior to Visit  Medication Sig Dispense Refill   albuterol (PROAIR HFA) 108 (90 Base) MCG/ACT inhaler TAKE 2 PUFFS BY MOUTH EVERY 6 HOURS AS NEEDED FOR WHEEZE OR SHORTNESS OF BREATH (Patient taking differently: Inhale 2 puffs into the lungs every 6 (six) hours as needed for wheezing or shortness of breath. TAKE 2 PUFFS BY MOUTH EVERY 6 HOURS AS NEEDED FOR WHEEZE OR SHORTNESS OF BREATH) 90 g 2   amLODipine (NORVASC) 5 MG tablet TAKE 1 TABLET (5 MG TOTAL) BY MOUTH DAILY. 90 tablet 1   fluticasone (FLONASE) 50 MCG/ACT nasal spray SPRAY 2 SPRAYS INTO EACH NOSTRIL EVERY DAY (Patient taking differently: Place 2 sprays into both nostrils daily.) 48 mL 1   levocetirizine (XYZAL) 5 MG tablet TAKE 1 TABLET BY MOUTH EVERY DAY IN THE EVENING 90 tablet 1   lisinopril (ZESTRIL) 10 MG tablet TAKE 2 TABLETS BY MOUTH EVERY DAY 180 tablet 1   Multiple Vitamins-Minerals (MENS MULTIVITAMIN PLUS) TABS Take 1 tablet by mouth daily.     simvastatin (ZOCOR) 40 MG tablet Take 1 tablet (40 mg total) by mouth at bedtime. 90 tablet 3   tamsulosin (FLOMAX) 0.4 MG CAPS capsule TAKE 1 CAPSULE BY MOUTH TWICE A DAY 180  capsule 3   No current facility-administered medications on file prior to visit.    BP (!) 134/56   Pulse 76   Temp 98.1 F (36.7 C)   Resp 18   Ht 5\' 4"  (1.626 m)   Wt 177 lb (80.3 kg)   SpO2 98%   BMI 30.38 kg/m        Objective:   Physical Exam  General Mental Status- Alert. General Appearance- Not in acute distress.   Skin General: Color- Normal Color. Moisture- Normal Moisture.  Neck  No JVD.  Chest and Lung Exam Auscultation: Breath Sounds:-CTA  Cardiovascular Auscultation:Rythm- RRR Murmurs & Other Heart Sounds:Auscultation of the heart reveals- No Murmurs.  Abdomen Inspection:-Inspeection Normal. Palpation/Percussion:Note:No mass. Palpation and Percussion of the abdomen reveal- Non Tender, Non Distended + BS, no rebound or guarding.   Neurologic Cranial Nerve exam:- CN III-XII intact(No nystagmus), symmetric smile. Strength:- 5/5 equal and symmetric strength both upper and lower extremities.       Assessment & Plan:   Assessment and Plan    Right foot pain Pain improving, no tenderness, no x-ray needed presently. - Pt states will use foot inserts for support. - If pain persists into next week, consider referral to sports medicine. Can notify me.  Asthma and allergic rhinitis history. Asthma controlled. Flonase for allergies, albuterol available. Previous exacerbation likely allergy-triggered. - Continue Flonase nasal spray. - Use albuterol as needed. - Consider nasal irrigation with saline 10-12 hours apart from Flonase. - Use antihistamine as backup if allergies worsen.  Asymptomatic premature ventricular contractions (PVCs) and high cholesterol Asymptomatic PVCs. Normal cardiac CT and stress test. On simvastatin 40 mg daily. - Continue follow-up with cardiologist next week. - Continue simvastatin 40 mg daily. - Monitor blood pressure with current medications: amlodipine 5 mg and lisinopril 10 mg.  Elevated blood sugar Slightly elevated  blood sugar, average 118 mg/dL. Reduced exercise due to ankle injury. Last A1c nearly a year ago. - Order A1c test to assess three-month blood sugar average. - Advise on low sugar diet and increased exercise once ankle improves.  Asbestos exposure Calcified plaques on CT chest, correlating  with past asbestos exposure. Asymptomatic. - Postpone/declined referral to pulmonology as he is asymptomatic and prefers to wait.  General Health Maintenance Not received flu vaccine this season. Discussed timing for future vaccinations. - Advise flu vaccination at the end of October for next season.       Follow up date to be determined after lab review.  Esperanza Richters, PA-C

## 2024-03-23 NOTE — Patient Instructions (Signed)
 Right foot pain Pain improving, no tenderness, no x-ray needed presently. - Pt states will use foot inserts for support. - If pain persists into next week, consider referral to sports medicine. Can notify me.  Asthma and allergic rhinitis history. Asthma controlled. Flonase for allergies, albuterol available. Previous exacerbation likely allergy-triggered. - Continue Flonase nasal spray. - Use albuterol as needed. - Consider nasal irrigation with saline 10-12 hours apart from Flonase. - Use antihistamine as backup if allergies worsen.  Asymptomatic premature ventricular contractions (PVCs) and high cholesterol Asymptomatic PVCs. Normal cardiac CT and stress test. On simvastatin 40 mg daily. - Continue follow-up with cardiologist next week. - Continue simvastatin 40 mg daily. - Monitor blood pressure with current medications: amlodipine 5 mg and lisinopril 10 mg.  Elevated blood sugar Slightly elevated blood sugar, average 118 mg/dL. Reduced exercise due to ankle injury. Last A1c nearly a year ago. - Order A1c test to assess three-month blood sugar average. - Advise on low sugar diet and increased exercise once ankle improves.  Asbestos exposure Calcified plaques on CT chest, correlating with past asbestos exposure. Asymptomatic. - Postpone/declined referral to pulmonology as he is asymptomatic and prefers to wait.  General Health Maintenance Not received flu vaccine this season. Discussed timing for future vaccinations. - Advise flu vaccination at the end of October for next season.

## 2024-03-31 ENCOUNTER — Ambulatory Visit: Payer: Medicare HMO | Attending: Cardiology | Admitting: Cardiology

## 2024-03-31 ENCOUNTER — Encounter: Payer: Self-pay | Admitting: Cardiology

## 2024-03-31 VITALS — BP 128/62 | HR 73 | Ht 68.0 in | Wt 173.0 lb

## 2024-03-31 DIAGNOSIS — I10 Essential (primary) hypertension: Secondary | ICD-10-CM | POA: Diagnosis not present

## 2024-03-31 DIAGNOSIS — R931 Abnormal findings on diagnostic imaging of heart and coronary circulation: Secondary | ICD-10-CM | POA: Diagnosis not present

## 2024-03-31 DIAGNOSIS — I472 Ventricular tachycardia, unspecified: Secondary | ICD-10-CM

## 2024-03-31 DIAGNOSIS — R739 Hyperglycemia, unspecified: Secondary | ICD-10-CM | POA: Diagnosis not present

## 2024-03-31 DIAGNOSIS — C61 Malignant neoplasm of prostate: Secondary | ICD-10-CM | POA: Diagnosis not present

## 2024-03-31 DIAGNOSIS — E782 Mixed hyperlipidemia: Secondary | ICD-10-CM | POA: Diagnosis not present

## 2024-03-31 NOTE — Progress Notes (Signed)
 Cardiology Office Note:    Date:  03/31/2024   ID:  Donald Porter, DOB 1941-09-09, MRN [id number]  PCP:  Esperanza Richters, PA-C  Cardiologist:  Gypsy Balsam, MD    Referring MD: Esperanza Richters, [place]   Chief Complaint  Patient presents with   Follow-up    History of Present Illness:    Donald Porter is a 83 y.o. male past medical history significant for essential hypertension, dyslipidemia who was referred to [place] because of PVCs and couplet but monitor shows some runs of nonsustained ventricular tachycardia with longest episode 15 beats, he was completely asymptomatic stratification of this arrhythmia included echocardiogram which showed preserved ejection fraction, he also got a stress test especially in view of the fact that he got elevated calcium score, stress test showed no evidence of ischemia he is coming here to talk about it.  He is doing very well try to walk on the regular basis week ago he sustained some injury to the right.  That slowed him down but he does have a bit of the week he started going to his exercise routine.  No dizziness no passing out no palpitations  Past Medical History:  Diagnosis Date   Allergy    Asthma    1988   GERD (gastroesophageal reflux disease)    Hyperlipidemia 04/26/2015   Hypertension    Malignant neoplasm of prostate (HCC) 05/02/2020   Nuclear sclerotic cataract of left eye 08/26/2017   Prostate cancer [facility])     Past Surgical History:  Procedure Laterality Date   EYE SURGERY     Age 13   HERNIA REPAIR     3 surgeries   PROSTATE BIOPSY      Current Medications: Current Meds  Medication Sig   albuterol (PROAIR HFA) 108 (90 Base) MCG/ACT inhaler TAKE 2 PUFFS BY MOUTH EVERY 6 HOURS AS NEEDED FOR WHEEZE OR SHORTNESS OF BREATH (Patient taking differently: Inhale 2 puffs into the lungs every 6 (six) hours as needed for wheezing or shortness of breath. TAKE 2 PUFFS BY MOUTH EVERY 6 HOURS AS NEEDED FOR WHEEZE OR SHORTNESS OF BREATH)    amLODipine (NORVASC) 5 MG tablet TAKE 1 TABLET (5 MG TOTAL) BY MOUTH DAILY.   fluticasone (FLONASE) 50 MCG/ACT nasal spray SPRAY 2 SPRAYS INTO EACH NOSTRIL EVERY DAY (Patient taking differently: Place 2 sprays into both nostrils daily.)   levocetirizine (XYZAL) 5 MG tablet TAKE 1 TABLET BY MOUTH EVERY DAY IN THE EVENING (Patient taking differently: Take 5 mg by mouth every evening.)   lisinopril (ZESTRIL) 10 MG tablet TAKE 2 TABLETS BY MOUTH EVERY DAY (Patient taking differently: Take 10 mg by mouth daily.)   Multiple Vitamins-Minerals (MENS MULTIVITAMIN PLUS) TABS Take 1 tablet by mouth daily.   simvastatin (ZOCOR) 40 MG tablet Take 1 tablet (40 mg total) by mouth at bedtime.   tamsulosin (FLOMAX) 0.4 MG CAPS capsule TAKE 1 CAPSULE BY MOUTH TWICE A DAY     Allergies:   Patient has no known allergies.   Social History   Socioeconomic History   Marital status: Widowed    Spouse name: Not on file   Number of children: 2   Years of education: Not on file   Highest education level: Not on file  Occupational History   Occupation: accountant    Comment: works part time  Tobacco Use   Smoking status: Never   Smokeless tobacco: Never  Vaping Use   Vaping status: Never Used  Substance and Sexual  Activity   Alcohol use: Yes    Alcohol/week: 2.0 standard drinks of alcohol    Types: 2 Glasses of wine per week    Comment: 1-2 glasses of wine 4-5 days per week   Drug use: Never   Sexual activity: Not Currently    Comment: widowed in 2018 after 57 years of marriage.  Other Topics Concern   Not on file  Social History Narrative   One living child. Lost his son at 51 to kidney disease. Daughter lives in [place].   Social Drivers of Corporate investment banker Strain: Low Risk  (06/20/2023)   Overall Financial Resource Strain (CARDIA)    Difficulty of Paying Living Expenses: Not hard at all  Food Insecurity: No Food Insecurity (06/20/2023)   Hunger Vital Sign    Worried About Running Out  of Food in the Last Year: Never true    Ran Out of Food in the Last Year: Never true  Transportation Needs: No Transportation Needs (06/20/2023)   PRAPARE - Administrator, Civil Service (Medical): No    Lack of Transportation (Non-Medical): No  Physical Activity: Sufficiently Active (06/20/2023)   Exercise Vital Sign    Days of Exercise per Week: 5 days    Minutes of Exercise per Session: 30 min  Stress: No Stress Concern Present (06/14/2021)   [employer] of Occupational Health - Occupational Stress Questionnaire    Feeling of Stress : Not at all  Social Connections: Moderately Isolated (11/15/2021)   Social Connection and Isolation Panel [NHANES]    Frequency of Communication with Friends and Family: More than three times a week    Frequency of Social Gatherings with Friends and Family: More than three times a week    Attends Religious Services: 1 to 4 times per year    Active Member of [employer] or Organizations: No    Attends Banker Meetings: Never    Marital Status: Widowed     Family History: The patient's family history includes Cancer in his father; Heart disease in his mother; Lung cancer in his maternal uncle and maternal uncle; Lymphoma in his father. There is no history of Breast cancer, Colon cancer, Pancreatic cancer, or Prostate cancer. ROS:   Please see the history of present illness.    All 14 point review of systems negative except as described per history of present illness  EKGs/Labs/Other Studies Reviewed:         Recent Labs: 05/19/2023: BUN 17; Creatinine, Ser 0.96; Potassium 4.6; Sodium 136 03/17/2024: ALT 18  Recent Lipid Panel    Component Value Date/Time   CHOL 153 03/17/2024 0811   TRIG 57 03/17/2024 0811   HDL 72 03/17/2024 0811   CHOLHDL 2.1 03/17/2024 0811   CHOLHDL 2 05/19/2023 0952   VLDL 10.8 05/19/2023 0952   LDLCALC 69 03/17/2024 0811   LDLCALC 85 09/06/2020 1000    Physical Exam:    VS:  BP 128/62 (BP  Location: Right Arm, Patient Position: Sitting)   Pulse 73   Ht 5\' 8"  (1.727 m)   Wt 173 lb (78.5 kg)   SpO2 93%   BMI 26.30 kg/m     Wt Readings from Last 3 Encounters:  03/31/24 173 lb (78.5 kg)  03/23/24 177 lb (80.3 kg)  03/02/24 184 lb (83.5 kg)     GEN:  Well nourished, well developed in no acute distress HEENT: Normal NECK: No JVD; No carotid bruits LYMPHATICS: No lymphadenopathy CARDIAC: RRR, no murmurs,  no rubs, no gallops RESPIRATORY:  Clear to auscultation without rales, wheezing or rhonchi  ABDOMEN: Soft, non-tender, non-distended MUSCULOSKELETAL:  No edema; No deformity  SKIN: Warm and dry LOWER EXTREMITIES: no swelling NEUROLOGIC:  Alert and oriented x 3 PSYCHIATRIC:  Normal affect   ASSESSMENT:    1. Ventricular tachycardia (HCC)   2. Essential hypertension   3. Malignant neoplasm of prostate (HCC)   4. Mixed hyperlipidemia   5. Elevated coronary artery calcium score    PLAN:    In order of problems listed above:  Ventricular tachycardia asymptomatic asking to let me know if he gets episode of syncope or passing out, stratification showed low risk stress test negative echocardiogram preserved ejection fraction not identified beta-blocker because of bradycardia Essential hypertension blood pressure seems to well-controlled continue present management. Mixed dyslipidemia I did review his K PN which show me LDL 69 HDL 72 continue present management. Borderline diabetes with hemoglobin A1c of 5.9.  I will schedule him to have a hemoglobin A1c done today. Elevated calcium score asymptomatic stress test negative risk factors modifications   Medication Adjustments/Labs and Tests Ordered: Current medicines are reviewed at length with the patient today.  Concerns regarding medicines are outlined above.  No orders of the defined types were placed in this encounter.  Medication changes: No orders of the defined types were placed in this  encounter.   Signed, Georgeanna Lea, MD, [facility] 03/31/2024 10:59 AM    [facility]

## 2024-03-31 NOTE — Patient Instructions (Addendum)
 Medication Instructions:  Your physician recommends that you continue on your current medications as directed. Please refer to the Current Medication list given to you today.  *If you need a refill on your cardiac medications before your next appointment, please call your pharmacy*   Lab Work: 3rd Floor   [address]  Your physician recommends that you return for lab work in: Today   You need to have labs done when you are fasting.  You can come Monday through Friday 8:00 am to 11:30AM and 1:00 to 4:00. You do not need to make an appointment as the order has already been placed.    Testing/Procedures: None Ordered   Follow-Up: At [facility], you and your health needs are our priority.  As part of our continuing mission to provide you with exceptional heart care, we have created designated Provider Care Teams.  These Care Teams include your primary Cardiologist (physician) and Advanced Practice Providers (APPs -  Physician Assistants and Nurse Practitioners) who all work together to provide you with the care you need, when you need it.  We recommend signing up for the patient portal called "MyChart".  Sign up information is provided on this After Visit Summary.  MyChart is used to connect with patients for Virtual Visits (Telemedicine).  Patients are able to view lab/test results, encounter notes, upcoming appointments, etc.  Non-urgent messages can be sent to your provider as well.   To learn more about what you can do with MyChart, go to [email or web address].    Your next appointment:   6 month(s)  The format for your next appointment:   In Person  Provider:   Gypsy Balsam, MD    Other Instructions NA

## 2024-03-31 NOTE — Addendum Note (Signed)
 Addended by: Baldo Ash D on: 03/31/2024 11:12 AM   Modules accepted: Orders

## 2024-04-01 LAB — HEMOGLOBIN A1C
Est. average glucose Bld gHb Est-mCnc: 117 mg/dL
Hgb A1c MFr Bld: 5.7 % — ABNORMAL HIGH (ref 4.8–5.6)

## 2024-04-13 ENCOUNTER — Telehealth: Payer: Self-pay

## 2024-04-13 NOTE — Telephone Encounter (Signed)
-----   Message from Ralene Burger sent at 04/01/2024  1:48 PM EDT ----- Hemoglobin see elevated copy to primary care physician

## 2024-04-13 NOTE — Telephone Encounter (Signed)
 Patient notified of results and verbalized understanding . Results faxed to PCP and advised to follow up with the patient.

## 2024-04-23 ENCOUNTER — Other Ambulatory Visit: Payer: Self-pay | Admitting: Medical

## 2024-05-03 DIAGNOSIS — H5211 Myopia, right eye: Secondary | ICD-10-CM | POA: Diagnosis not present

## 2024-05-07 ENCOUNTER — Other Ambulatory Visit: Payer: Self-pay | Admitting: Medical

## 2024-05-26 ENCOUNTER — Other Ambulatory Visit: Payer: Self-pay | Admitting: Medical

## 2024-06-09 DIAGNOSIS — D044 Carcinoma in situ of skin of scalp and neck: Secondary | ICD-10-CM | POA: Diagnosis not present

## 2024-06-09 DIAGNOSIS — Z85828 Personal history of other malignant neoplasm of skin: Secondary | ICD-10-CM | POA: Diagnosis not present

## 2024-07-08 ENCOUNTER — Ambulatory Visit

## 2024-08-10 DIAGNOSIS — Z08 Encounter for follow-up examination after completed treatment for malignant neoplasm: Secondary | ICD-10-CM | POA: Diagnosis not present

## 2024-08-10 DIAGNOSIS — Z85828 Personal history of other malignant neoplasm of skin: Secondary | ICD-10-CM | POA: Diagnosis not present

## 2024-08-26 ENCOUNTER — Telehealth: Payer: Self-pay | Admitting: *Deleted

## 2024-08-26 ENCOUNTER — Ambulatory Visit

## 2024-08-26 NOTE — Telephone Encounter (Signed)
 Pt was scheduled for AWV today at 11.  No answer x 3 attempts and no voicemail. Mailed letter to pt to r/s

## 2024-09-13 ENCOUNTER — Ambulatory Visit: Admitting: Medical

## 2024-09-15 ENCOUNTER — Ambulatory Visit (INDEPENDENT_AMBULATORY_CARE_PROVIDER_SITE_OTHER)

## 2024-09-15 VITALS — BP 128/62 | Ht 68.0 in | Wt 173.0 lb

## 2024-09-15 DIAGNOSIS — Z0000 Encounter for general adult medical examination without abnormal findings: Secondary | ICD-10-CM | POA: Diagnosis not present

## 2024-09-15 NOTE — Progress Notes (Signed)
 Because this visit was a virtual/telehealth visit,  certain criteria was not obtained, such a blood pressure, CBG if applicable, and timed get up and go. Any medications not marked as taking were not mentioned during the medication reconciliation part of the visit. Any vitals not documented were not able to be obtained due to this being a telehealth visit or patient was unable to self-report a recent blood pressure reading due to a lack of equipment at home via telehealth. Vitals that have been documented are verbally provided by the patient.  This visit was performed by a medical professional under my direct supervision. I was immediately available for consultation/collaboration. I have reviewed and agree with the Annual Wellness Visit documentation.  Subjective:   Donald Porter is a 83 y.o. who presents for a Medicare Wellness preventive visit.  As a reminder, Annual Wellness Visits don't include a physical exam, and some assessments may be limited, especially if this visit is performed virtually. We may recommend an in-person follow-up visit with your provider if needed.  Visit Complete: Virtual I connected with  Donald Porter on 09/15/24 by a audio enabled telemedicine application and verified that I am speaking with the correct person using two identifiers.  Patient Location: Home  Provider Location: Home Office  I discussed the limitations of evaluation and management by telemedicine. The patient expressed understanding and agreed to proceed.  Vital Signs: Because this visit was a virtual/telehealth visit, some criteria may be missing or patient reported. Any vitals not documented were not able to be obtained and vitals that have been documented are patient reported.  VideoDeclined- This patient declined Librarian, academic. Therefore the visit was completed with audio only.  Persons Participating in Visit: Patient.  AWV Questionnaire: No: Patient Medicare  AWV questionnaire was not completed prior to this visit.  Cardiac Risk Factors include: advanced age (>40men, >63 women);male gender;hypertension;dyslipidemia     Objective:    Today's Vitals   09/15/24 1501  BP: 128/62  Weight: 173 lb (78.5 kg)  Height: 5' 8 (1.727 m)   Body mass index is 26.3 kg/m.     09/15/2024    3:12 PM 06/20/2023    9:40 AM 06/18/2022    9:05 AM 06/14/2021    9:23 AM 06/09/2020    9:46 AM 05/02/2020    8:50 AM 06/04/2019   10:16 AM  Advanced Directives  Does Patient Have a Medical Advance Directive? No Yes Yes Yes Yes Yes Yes  Type of Furniture conservator/restorer;Living will Healthcare Power of [place];Out of facility DNR (pink MOST or yellow form);Living will Healthcare Power of [place];Living will Healthcare Power of [place];Living will Living will;Healthcare Power of Attorney Living will;Healthcare Power of Attorney  Does patient want to make changes to medical advance directive?   No - Patient declined   No - Patient declined No - Patient declined   Copy of Healthcare Power of Attorney in Chart?  No - copy requested No - copy requested No - copy requested No - copy requested No - copy requested No - copy requested   Would patient like information on creating a medical advance directive? No - Patient declined           Data saved with a previous flowsheet row definition    Current Medications (verified) Outpatient Encounter Medications as of 09/15/2024  Medication Sig   albuterol  (VENTOLIN  HFA) 108 (90 Base) MCG/ACT inhaler Inhale 2 puffs into the lungs every 6 (six)  hours as needed for wheezing or shortness of breath. TAKE 2 PUFFS BY MOUTH EVERY 6 HOURS AS NEEDED FOR WHEEZE OR SHORTNESS OF BREATH   amLODipine  (NORVASC ) 5 MG tablet TAKE 1 TABLET (5 MG TOTAL) BY MOUTH DAILY.   AREXVY 120 MCG/0.5ML injection Inject 0.5 mLs into the muscle once.   fluticasone  (FLONASE ) 50 MCG/ACT nasal spray SPRAY 2 SPRAYS INTO EACH NOSTRIL EVERY DAY  (Patient taking differently: Place 2 sprays into both nostrils daily.)   levocetirizine (XYZAL ) 5 MG tablet TAKE 1 TABLET BY MOUTH EVERY DAY IN THE EVENING (Patient taking differently: Take 5 mg by mouth every evening.)   lisinopril  (ZESTRIL ) 10 MG tablet TAKE 2 TABLETS BY MOUTH EVERY DAY   Multiple Vitamins-Minerals (MENS MULTIVITAMIN PLUS) TABS Take 1 tablet by mouth daily.   simvastatin  (ZOCOR ) 40 MG tablet Take 1 tablet (40 mg total) by mouth at bedtime.   SPIKEVAX syringe Inject 0.5 mLs into the muscle once.   tamsulosin  (FLOMAX ) 0.4 MG CAPS capsule TAKE 1 CAPSULE BY MOUTH TWICE A DAY   No facility-administered encounter medications on file as of 09/15/2024.    Allergies (verified) Patient has no known allergies.   History: Past Medical History:  Diagnosis Date   Allergy    Asthma    1988   GERD (gastroesophageal reflux disease)    Hyperlipidemia 04/26/2015   Hypertension    Malignant neoplasm of prostate (HCC) 05/02/2020   Nuclear sclerotic cataract of left eye 08/26/2017   Prostate cancer [facility])    Past Surgical History:  Procedure Laterality Date   EYE SURGERY     Age 29   HERNIA REPAIR     3 surgeries   PROSTATE BIOPSY     Family History  Problem Relation Age of Onset   Heart disease Mother    Lymphoma Father    Cancer Father    Lung cancer Maternal Uncle    Lung cancer Maternal Uncle    Breast cancer Neg Hx    Colon cancer Neg Hx    Pancreatic cancer Neg Hx    Prostate cancer Neg Hx    Social History   Socioeconomic History   Marital status: Widowed    Spouse name: Not on file   Number of children: 2   Years of education: Not on file   Highest education level: Not on file  Occupational History   Occupation: accountant    Comment: works part time  Tobacco Use   Smoking status: Never   Smokeless tobacco: Never  Vaping Use   Vaping status: Never Used  Substance and Sexual Activity   Alcohol use: Yes    Alcohol/week: 2.0 standard drinks of alcohol     Types: 2 Glasses of wine per week    Comment: 1-2 glasses of wine 4-5 days per week   Drug use: Never   Sexual activity: Not Currently    Comment: widowed in 2018 after 57 years of marriage.  Other Topics Concern   Not on file  Social History Narrative   One living child. Lost his son at 51 to kidney disease. Daughter lives in [place].   Social Drivers of Corporate investment banker Strain: Low Risk  (09/15/2024)   Overall Financial Resource Strain (CARDIA)    Difficulty of Paying Living Expenses: Not hard at all  Food Insecurity: No Food Insecurity (09/15/2024)   Hunger Vital Sign    Worried About Running Out of Food in the Last Year: Never true  Ran Out of Food in the Last Year: Never true  Transportation Needs: No Transportation Needs (09/15/2024)   PRAPARE - Administrator, Civil Service (Medical): No    Lack of Transportation (Non-Medical): No  Physical Activity: Insufficiently Active (09/15/2024)   Exercise Vital Sign    Days of Exercise per Week: 4 days    Minutes of Exercise per Session: 30 min  Stress: No Stress Concern Present (09/15/2024)   [employer] of Occupational Health - Occupational Stress Questionnaire    Feeling of Stress: Not at all  Social Connections: Moderately Isolated (09/15/2024)   Social Connection and Isolation Panel    Frequency of Communication with Friends and Family: More than three times a week    Frequency of Social Gatherings with Friends and Family: More than three times a week    Attends Religious Services: 1 to 4 times per year    Active Member of [employer] or Organizations: No    Attends Banker Meetings: Never    Marital Status: Widowed    Tobacco Counseling Counseling given: Not Answered    Clinical Intake:  Pre-visit preparation completed: Yes  Pain : No/denies pain     BMI - recorded: 26.3 Nutritional Status: BMI 25 -29 Overweight Nutritional Risks: None Diabetes: No  Lab Results  Component  Value Date   HGBA1C 5.7 (H) 03/31/2024   HGBA1C 5.9 05/19/2023   HGBA1C 5.9 09/10/2022     How often do you need to have someone help you when you read instructions, pamphlets, or other written materials from your doctor or pharmacy?: 1 - Never  Interpreter Needed?: No  Information entered by :: Anamika Kueker,CMA   Activities of Daily Living     09/15/2024    3:07 PM  In your present state of health, do you have any difficulty performing the following activities:  Hearing? 0  Vision? 0  Difficulty concentrating or making decisions? 0  Walking or climbing stairs? 0  Dressing or bathing? 0  Doing errands, shopping? 0  Preparing Food and eating ? N  Using the Toilet? N  In the past six months, have you accidently leaked urine? N  Do you have problems with loss of bowel control? N  Managing your Medications? N  Managing your Finances? N  Housekeeping or managing your Housekeeping? N    Patient Care Team: [name], Edward, PA-C as PCP - General (Physician Assistant) Carla Milling, RPH-CPP (Pharmacist)  I have updated your Care Teams any recent Medical Services you may have received from other providers in the past year.     Assessment:   This is a routine wellness examination for Donald Porter.  Hearing/Vision screen Hearing Screening - Comments:: No difficulties Vision Screening - Comments:: Patient wears glasses    Goals Addressed             This Visit's Progress    Patient Stated   On track    Increase walking       Depression Screen     09/15/2024    3:13 PM 06/20/2023    9:55 AM 06/18/2022    9:07 AM 06/14/2021    9:13 AM 06/09/2020    9:44 AM 06/04/2019   10:16 AM 06/01/2018   10:16 AM  PHQ 2/9 Scores  PHQ - 2 Score 2 0 0 1 0 0 0  PHQ- 9 Score 2          Fall Risk     09/15/2024    3:11  PM 06/20/2023    9:53 AM 05/19/2023    9:14 AM 06/18/2022    9:06 AM 06/14/2021    9:13 AM  Fall Risk   Falls in the past year? 0 0 0 0 0  Number falls in past yr: 0 0  0 0 0  Injury with Fall? 0 0 0 0 0  Risk for fall due to : No Fall Risks No Fall Risks No Fall Risks No Fall Risks   Follow up Falls evaluation completed Falls evaluation completed Education provided;Falls evaluation completed Falls evaluation completed  Falls prevention discussed      Data saved with a previous flowsheet row definition    MEDICARE RISK AT HOME:  Medicare Risk at Home Any stairs in or around the home?: Yes If so, are there any without handrails?: No Home free of loose throw rugs in walkways, pet beds, electrical cords, etc?: Yes Adequate lighting in your home to reduce risk of falls?: Yes Life alert?: No Use of a cane, walker or w/c?: No Grab bars in the bathroom?: Yes Shower chair or bench in shower?: Yes Elevated toilet seat or a handicapped toilet?: Yes  TIMED UP AND GO:  Was the test performed?  No  Cognitive Function: 6CIT completed        09/15/2024    3:05 PM 06/20/2023    9:58 AM 06/18/2022    9:24 AM  6CIT Screen  What Year? 0 points 0 points 0 points  What month? 0 points 0 points 0 points  What time? 0 points 0 points 0 points  Count back from 20 0 points 0 points 0 points  Months in reverse 0 points 0 points 0 points  Repeat phrase 0 points 0 points 0 points  Total Score 0 points 0 points 0 points    Immunizations Immunization History  Administered Date(s) Administered   Fluad Quad(high Dose 65+) 09/20/2019, 12/06/2020, 10/30/2021, 09/10/2022   INFLUENZA, HIGH DOSE SEASONAL PF 10/17/2017, 10/07/2018   Influenza,inj,Quad PF,6+ Mos 11/09/2015   Influenza-Unspecified 09/18/2016   Moderna Covid-19 Fall Seasonal Vaccine 36yrs & older 07/22/2024   PFIZER(Purple Top)SARS-COV-2 Vaccination 01/08/2020, 01/29/2020, 10/11/2020   Pneumococcal Conjugate-13 02/05/2017   Pneumococcal Polysaccharide-23 12/07/2015   Respiratory Syncytial Virus Vaccine,Recomb Aduvanted(Arexvy) 07/22/2024   Tdap 07/10/2016   Zoster Recombinant(Shingrix) 06/16/2021,  10/03/2021   Zoster, Live 11/09/2015    Screening Tests Health Maintenance  Topic Date Due   Medicare Annual Wellness (AWV)  06/19/2024   Influenza Vaccine  07/30/2024   COVID-19 Vaccine (5 - Pfizer risk 2024-25 season) 01/22/2025   DTaP/Tdap/Td (2 - Td or Tdap) 07/10/2026   Pneumococcal Vaccine: 50+ Years  Completed   Zoster Vaccines- Shingrix  Completed   HPV VACCINES  Aged Out   Meningococcal B Vaccine  Aged Out    Health Maintenance Items Addressed:patient declined   Additional Screening:  Vision Screening: Recommended annual ophthalmology exams for early detection of glaucoma and other disorders of the eye. Is the patient up to date with their annual eye exam?  yes Who is the provider or what is the name of the office in which the patient attends annual eye exams? [facility] in [place] on [address]  Dental Screening: Recommended annual dental exams for proper oral hygiene  Community Resource Referral / Chronic Care Management: CRR required this visit?  No   CCM required this visit?  No   Plan:    I have personally reviewed and noted the following in the patient's  chart:   Medical and social history Use of alcohol, tobacco or illicit drugs  Current medications and supplements including opioid prescriptions. Patient is not currently taking opioid prescriptions. Functional ability and status Nutritional status Physical activity Advanced directives List of other physicians Hospitalizations, surgeries, and ER visits in previous 12 months Vitals Screenings to include cognitive, depression, and falls Referrals and appointments  In addition, I have reviewed and discussed with patient certain preventive protocols, quality metrics, and best practice recommendations. A written personalized care plan for preventive services as well as general preventive health recommendations were provided to patient.   Lyle MARLA Right, [place]   09/15/2024   After  Visit Summary: (MyChart) Due to this being a telephonic visit, the after visit summary with patients personalized plan was offered to patient via MyChart   Notes: Nothing significant to report at this time.

## 2024-09-15 NOTE — Patient Instructions (Signed)
 Donald Porter,  Thank you for taking the time for your Medicare Wellness Visit. I appreciate your continued commitment to your health goals. Please review the care plan we discussed, and feel free to reach out if I can assist you further.  Medicare recommends these wellness visits once per year to help you and your care team stay ahead of potential health issues. These visits are designed to focus on prevention, allowing your provider to concentrate on managing your acute and chronic conditions during your regular appointments.  Please note that Annual Wellness Visits do not include a physical exam. Some assessments may be limited, especially if the visit was conducted virtually. If needed, we may recommend a separate in-person follow-up with your provider.  Ongoing Care Seeing your primary care provider every 3 to 6 months helps us  monitor your health and provide consistent, personalized care.   Referrals If a referral was made during today's visit and you haven't received any updates within two weeks, please contact the referred provider directly to check on the status.  Recommended Screenings:  Health Maintenance  Topic Date Due   Medicare Annual Wellness Visit  06/19/2024   Flu Shot  07/30/2024   COVID-19 Vaccine (5 - Pfizer risk 2024-25 season) 01/22/2025   DTaP/Tdap/Td vaccine (2 - Td or Tdap) 07/10/2026   Pneumococcal Vaccine for age over 19  Completed   Zoster (Shingles) Vaccine  Completed   HPV Vaccine  Aged Out   Meningitis B Vaccine  Aged Out       09/15/2024    3:12 PM  Advanced Directives  Does Patient Have a Medical Advance Directive? No  Would patient like information on creating a medical advance directive? No - Patient declined   Advance Care Planning is important because it: Ensures you receive medical care that aligns with your values, goals, and preferences. Provides guidance to your family and loved ones, reducing the emotional burden of decision-making during  critical moments.  Vision: Annual vision screenings are recommended for early detection of glaucoma, cataracts, and diabetic retinopathy. These exams can also reveal signs of chronic conditions such as diabetes and high blood pressure.  Dental: Annual dental screenings help detect early signs of oral cancer, gum disease, and other conditions linked to overall health, including heart disease and diabetes.  Please see the attached documents for additional preventive care recommendations.

## 2024-09-27 DIAGNOSIS — L2089 Other atopic dermatitis: Secondary | ICD-10-CM | POA: Diagnosis not present

## 2024-09-27 DIAGNOSIS — Z85828 Personal history of other malignant neoplasm of skin: Secondary | ICD-10-CM | POA: Diagnosis not present

## 2024-10-10 ENCOUNTER — Other Ambulatory Visit: Payer: Self-pay | Admitting: Cardiology

## 2024-10-16 ENCOUNTER — Other Ambulatory Visit: Payer: Self-pay | Admitting: Medical

## 2024-10-25 ENCOUNTER — Encounter: Payer: Self-pay | Admitting: Medical

## 2024-10-25 ENCOUNTER — Ambulatory Visit (INDEPENDENT_AMBULATORY_CARE_PROVIDER_SITE_OTHER): Admitting: Medical

## 2024-10-25 ENCOUNTER — Ambulatory Visit: Payer: Self-pay | Admitting: Medical

## 2024-10-25 VITALS — BP 120/70 | HR 66 | Temp 97.6°F | Resp 15 | Ht 68.0 in | Wt 166.6 lb

## 2024-10-25 DIAGNOSIS — E785 Hyperlipidemia, unspecified: Secondary | ICD-10-CM

## 2024-10-25 DIAGNOSIS — R634 Abnormal weight loss: Secondary | ICD-10-CM

## 2024-10-25 DIAGNOSIS — L2089 Other atopic dermatitis: Secondary | ICD-10-CM | POA: Diagnosis not present

## 2024-10-25 DIAGNOSIS — R630 Anorexia: Secondary | ICD-10-CM | POA: Diagnosis not present

## 2024-10-25 DIAGNOSIS — L988 Other specified disorders of the skin and subcutaneous tissue: Secondary | ICD-10-CM | POA: Diagnosis not present

## 2024-10-25 DIAGNOSIS — R739 Hyperglycemia, unspecified: Secondary | ICD-10-CM

## 2024-10-25 DIAGNOSIS — I10 Essential (primary) hypertension: Secondary | ICD-10-CM | POA: Diagnosis not present

## 2024-10-25 DIAGNOSIS — Z23 Encounter for immunization: Secondary | ICD-10-CM

## 2024-10-25 LAB — COMPREHENSIVE METABOLIC PANEL WITH GFR
ALT: 21 U/L (ref 0–53)
AST: 23 U/L (ref 0–37)
Albumin: 4.5 g/dL (ref 3.5–5.2)
Alkaline Phosphatase: 37 U/L — ABNORMAL LOW (ref 39–117)
BUN: 18 mg/dL (ref 6–23)
CO2: 29 meq/L (ref 19–32)
Calcium: 9.7 mg/dL (ref 8.4–10.5)
Chloride: 96 meq/L (ref 96–112)
Creatinine, Ser: 0.9 mg/dL (ref 0.40–1.50)
GFR: 78.12 mL/min (ref >60.00)
Glucose, Bld: 98 mg/dL (ref 70–99)
Potassium: 4.6 meq/L (ref 3.5–5.1)
Sodium: 132 meq/L — ABNORMAL LOW (ref 135–145)
Total Bilirubin: 0.9 mg/dL (ref 0.2–1.2)
Total Protein: 7.4 g/dL (ref 6.0–8.3)

## 2024-10-25 LAB — LIPID PANEL
Cholesterol: 169 mg/dL (ref 0–200)
HDL: 83.5 mg/dL (ref >39.00)
LDL Cholesterol: 72 mg/dL (ref 0–99)
NonHDL: 85.71
Total CHOL/HDL Ratio: 2
Triglycerides: 71 mg/dL (ref 0.0–149.0)
VLDL: 14.2 mg/dL (ref 0.0–40.0)

## 2024-10-25 LAB — HEMOGLOBIN A1C: Hgb A1c MFr Bld: 5.9 % (ref 4.6–6.5)
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
## 2024-10-25 NOTE — Patient Instructions (Addendum)
 Unintentional weight loss Weight loss from 173 lbs to 166 lbs likely due to decreased appetite, common in individuals over 80, potentially leading to muscle mass loss. - Recommend nutritional supplements like Boost or Ensure to supplement two meals a day. - Follow up in 3 months to assess weight maintenance. (weight yourself weekly and let me know if weight loss accelerating. See sooner if that is the case)   Hypertension Hypertension with recent increase in blood pressure. Managed with lisinopril  and amlodipine . Blood pressure improved to 120/70. - Continue lisinopril  20 mg daily. - Continue amlodipine  5 mg daily. - Check blood pressure today.  Prediabetes Prediabetes with potential improvement in A1c due to decreased food intake. Monitoring necessary. - Order metabolic panel, lipid panel, and A1c today. - Monitor blood sugar levels.  History of prostate cancer with lower urinary tract symptoms Follow-up with urologist planned. - Schedule follow-up with urologist in February 2026 for PSA check.  Follow up 3 months or sooner if needed

## 2024-10-27 ENCOUNTER — Other Ambulatory Visit: Payer: Self-pay | Admitting: Medical

## 2024-11-01 ENCOUNTER — Other Ambulatory Visit: Payer: Self-pay | Admitting: Medical

## 2024-12-07 ENCOUNTER — Encounter: Payer: Self-pay | Admitting: Cardiology

## 2024-12-07 ENCOUNTER — Ambulatory Visit: Attending: Cardiology | Admitting: Cardiology

## 2024-12-07 VITALS — BP 110/60 | HR 73 | Ht 68.0 in | Wt 172.0 lb

## 2024-12-07 DIAGNOSIS — I10 Essential (primary) hypertension: Secondary | ICD-10-CM

## 2024-12-07 DIAGNOSIS — E782 Mixed hyperlipidemia: Secondary | ICD-10-CM | POA: Diagnosis not present

## 2024-12-07 DIAGNOSIS — R931 Abnormal findings on diagnostic imaging of heart and coronary circulation: Secondary | ICD-10-CM

## 2024-12-07 DIAGNOSIS — L570 Actinic keratosis: Secondary | ICD-10-CM | POA: Diagnosis not present

## 2024-12-07 DIAGNOSIS — Z85828 Personal history of other malignant neoplasm of skin: Secondary | ICD-10-CM | POA: Diagnosis not present

## 2024-12-07 DIAGNOSIS — Z129 Encounter for screening for malignant neoplasm, site unspecified: Secondary | ICD-10-CM | POA: Diagnosis not present

## 2024-12-07 DIAGNOSIS — L2089 Other atopic dermatitis: Secondary | ICD-10-CM | POA: Diagnosis not present

## 2024-12-07 DIAGNOSIS — R0609 Other forms of dyspnea: Secondary | ICD-10-CM

## 2024-12-07 DIAGNOSIS — D1801 Hemangioma of skin and subcutaneous tissue: Secondary | ICD-10-CM | POA: Diagnosis not present

## 2024-12-07 DIAGNOSIS — I472 Ventricular tachycardia, unspecified: Secondary | ICD-10-CM

## 2024-12-07 DIAGNOSIS — L821 Other seborrheic keratosis: Secondary | ICD-10-CM | POA: Diagnosis not present

## 2024-12-07 DIAGNOSIS — L820 Inflamed seborrheic keratosis: Secondary | ICD-10-CM | POA: Diagnosis not present

## 2024-12-07 NOTE — Progress Notes (Unsigned)
 Cardiology Office Note:    Date:  12/07/2024   ID:  Donald Porter [place], DOB January 05, 1941, MRN [id number]  PCP:  Dorina Loving, PA-C  Cardiologist:  Lamar Fitch, MD    Referring MD: Dorina Loving, [place]   Chief Complaint  Patient presents with   Follow-up    History of Present Illness:    Donald Porter is a 83 y.o. male past medical history significant for PVCs, nonsustained ventricular tachycardia, asymptomatic, evaluation included echocardiogram showed preserved ejection fraction, stress test showing no evidence of ischemia, he does have elevated calcium score.  Comes today to months for follow-up, overall doing well.  Denies have any chest pain tightness squeezing pressure burning chest he does what he wants to do with no difficulties  Past Medical History:  Diagnosis Date   Allergy    Asthma    1988   GERD (gastroesophageal reflux disease)    Hyperlipidemia 04/26/2015   Hypertension    Malignant neoplasm of prostate (HCC) 05/02/2020   Nuclear sclerotic cataract of left eye 08/26/2017   Prostate cancer [facility])     Past Surgical History:  Procedure Laterality Date   EYE SURGERY     Age 89   HERNIA REPAIR     3 surgeries   PROSTATE BIOPSY      Current Medications: Current Meds  Medication Sig   albuterol  (VENTOLIN  HFA) 108 (90 Base) MCG/ACT inhaler Inhale 2 puffs into the lungs every 6 (six) hours as needed for wheezing or shortness of breath. TAKE 2 PUFFS BY MOUTH EVERY 6 HOURS AS NEEDED FOR WHEEZE OR SHORTNESS OF BREATH   amLODipine  (NORVASC ) 5 MG tablet TAKE 1 TABLET (5 MG TOTAL) BY MOUTH DAILY.   clobetasol ointment (TEMOVATE) 0.05 % Apply 1 Application topically 2 (two) times daily.   fluticasone  (FLONASE ) 50 MCG/ACT nasal spray SPRAY 2 SPRAYS INTO EACH NOSTRIL EVERY DAY   lisinopril  (ZESTRIL ) 10 MG tablet TAKE 2 TABLETS BY MOUTH EVERY DAY   Multiple Vitamins-Minerals (MENS MULTIVITAMIN PLUS) TABS Take 1 tablet by mouth daily.   simvastatin  (ZOCOR ) 40 MG tablet Take  1 tablet (40 mg total) by mouth at bedtime.   tamsulosin  (FLOMAX ) 0.4 MG CAPS capsule TAKE 1 CAPSULE BY MOUTH TWICE A DAY     Allergies:   Patient has no known allergies.   Social History   Socioeconomic History   Marital status: Widowed    Spouse name: Not on file   Number of children: 2   Years of education: Not on file   Highest education level: Not on file  Occupational History   Occupation: accountant    Comment: works part time  Tobacco Use   Smoking status: Never   Smokeless tobacco: Never  Vaping Use   Vaping status: Never Used  Substance and Sexual Activity   Alcohol use: Yes    Alcohol/week: 2.0 standard drinks of alcohol    Types: 2 Glasses of wine per week    Comment: 1-2 glasses of wine 4-5 days per week   Drug use: Never   Sexual activity: Not Currently    Comment: widowed in 2018 after 57 years of marriage.  Other Topics Concern   Not on file  Social History Narrative   One living child. Lost his son at 65 to kidney disease. Daughter lives in [place].   Social Drivers of Health   Financial Resource Strain: Low Risk  (09/15/2024)   Overall Financial Resource Strain (CARDIA)    Difficulty of Paying Living Expenses:  Not hard at all  Food Insecurity: No Food Insecurity (09/15/2024)   Hunger Vital Sign    Worried About Running Out of Food in the Last Year: Never true    Ran Out of Food in the Last Year: Never true  Transportation Needs: No Transportation Needs (09/15/2024)   PRAPARE - Administrator, Civil Service (Medical): No    Lack of Transportation (Non-Medical): No  Physical Activity: Insufficiently Active (09/15/2024)   Exercise Vital Sign    Days of Exercise per Week: 4 days    Minutes of Exercise per Session: 30 min  Stress: No Stress Concern Present (09/15/2024)   [employer] of Occupational Health - Occupational Stress Questionnaire    Feeling of Stress: Not at all  Social Connections: Moderately Isolated (09/15/2024)   Social  Connection and Isolation Panel    Frequency of Communication with Friends and Family: More than three times a week    Frequency of Social Gatherings with Friends and Family: More than three times a week    Attends Religious Services: 1 to 4 times per year    Active Member of [employer] or Organizations: No    Attends Banker Meetings: Never    Marital Status: Widowed     Family History: The patient's family history includes Cancer in his father; Heart disease in his mother; Lung cancer in his maternal uncle and maternal uncle; Lymphoma in his father. There is no history of Breast cancer, Colon cancer, Pancreatic cancer, or Prostate cancer. ROS:   Please see the history of present illness.    All 14 point review of systems negative except as described per history of present illness  EKGs/Labs/Other Studies Reviewed:    EKG Interpretation Date/Time:  Tuesday December 07 2024 10:27:02 EST Ventricular Rate:  73 PR Interval:  216 QRS Duration:  82 QT Interval:  358 QTC Calculation: 394 R Axis:   65  Text Interpretation: Sinus rhythm with 1st degree A-V block with Premature atrial complexes Cannot rule out Anterior infarct (cited on or before 12-Aug-2023) When compared with ECG of 12-Aug-2023 10:09, Premature atrial complexes are now Present Confirmed by Bernie Charleston [id number]) on 12/07/2024 10:32:14 AM    Recent Labs: 10/25/2024: ALT 21; BUN 18; Creatinine, Ser 0.90; Potassium 4.6; Sodium 132  Recent Lipid Panel    Component Value Date/Time   CHOL 169 10/25/2024 1026   CHOL 153 03/17/2024 0811   TRIG 71.0 10/25/2024 1026   HDL 83.50 10/25/2024 1026   HDL 72 03/17/2024 0811   CHOLHDL 2 10/25/2024 1026   VLDL 14.2 10/25/2024 1026   LDLCALC 72 10/25/2024 1026   LDLCALC 69 03/17/2024 0811   LDLCALC 85 09/06/2020 1000    Physical Exam:    VS:  BP 110/60   Pulse 73   Ht 5' 8 (1.727 m)   Wt 172 lb (78 kg)   SpO2 97%   BMI 26.15 kg/m     Wt Readings from Last 3  Encounters:  12/07/24 172 lb (78 kg)  10/25/24 166 lb 9.6 oz (75.6 kg)  09/15/24 173 lb (78.5 kg)     GEN:  Well nourished, well developed in no acute distress HEENT: Normal NECK: No JVD; No carotid bruits LYMPHATICS: No lymphadenopathy CARDIAC: RRR, no murmurs, no rubs, no gallops RESPIRATORY:  Clear to auscultation without rales, wheezing or rhonchi  ABDOMEN: Soft, non-tender, non-distended MUSCULOSKELETAL:  No edema; No deformity  SKIN: Warm and dry LOWER EXTREMITIES: 1+ swelling NEUROLOGIC:  Alert and  oriented x 3 PSYCHIATRIC:  Normal affect   ASSESSMENT:    1. Essential hypertension   2. Ventricular tachycardia (HCC)   3. Elevated coronary artery calcium score   4. Mixed hyperlipidemia    PLAN:    In order of problems listed above:  Essential hypertension blood pressure well-controlled continue present management. History of ventricular tachycardia no dizziness no palpitations continue monitoring. Elevated calcium score, asymptomatic, continue antiplatelet therapy as well as statin. Swelling of lower extremities which is mild I will repeat echocardiogram to check left ventricle ejection fraction   Medication Adjustments/Labs and Tests Ordered: Current medicines are reviewed at length with the patient today.  Concerns regarding medicines are outlined above.  Orders Placed This Encounter  Procedures   EKG 12-Lead   Medication changes: No orders of the defined types were placed in this encounter.   Signed, Lamar DOROTHA Fitch, MD, [facility] 12/07/2024 10:45 AM    [facility]

## 2024-12-07 NOTE — Patient Instructions (Signed)
 Medication Instructions:  Your physician recommends that you continue on your current medications as directed. Please refer to the Current Medication list given to you today.  *If you need a refill on your cardiac medications before your next appointment, please call your pharmacy*   Lab Work: None Ordered If you have labs (blood work) drawn today and your tests are completely normal, you will receive your results only by: MyChart Message (if you have MyChart) OR A paper copy in the mail If you have any lab test that is abnormal or we need to change your treatment, we will call you to review the results.   Testing/Procedures: Your physician has requested that you have an echocardiogram. Echocardiography is a painless test that uses sound waves to create images of your heart. It provides your doctor with information about the size and shape of your heart and how well your heart's chambers and valves are working. This procedure takes approximately one hour. There are no restrictions for this procedure. Please do NOT wear cologne, perfume, aftershave, or lotions (deodorant is allowed). Please arrive 15 minutes prior to your appointment time.  Please note: We ask at that you not bring children with you during ultrasound (echo/ vascular) testing. Due to room size and safety concerns, children are not allowed in the ultrasound rooms during exams. Our front office staff cannot provide observation of children in our lobby area while testing is being conducted. An adult accompanying a patient to their appointment will only be allowed in the ultrasound room at the discretion of the ultrasound technician under special circumstances. We apologize for any inconvenience.    Follow-Up: At [facility], you and your health needs are our priority.  As part of our continuing mission to provide you with exceptional heart care, we have created designated Provider Care Teams.  These Care Teams include your  primary Cardiologist (physician) and Advanced Practice Providers (APPs -  Physician Assistants and Nurse Practitioners) who all work together to provide you with the care you need, when you need it.  We recommend signing up for the patient portal called MyChart.  Sign up information is provided on this After Visit Summary.  MyChart is used to connect with patients for Virtual Visits (Telemedicine).  Patients are able to view lab/test results, encounter notes, upcoming appointments, etc.  Non-urgent messages can be sent to your provider as well.   To learn more about what you can do with MyChart, go to [email or web address].    Your next appointment:   12 month(s)  The format for your next appointment:   In Person  Provider:   Lamar Fitch, MD    Other Instructions NA

## 2025-01-05 ENCOUNTER — Ambulatory Visit (HOSPITAL_BASED_OUTPATIENT_CLINIC_OR_DEPARTMENT_OTHER)
Admission: RE | Admit: 2025-01-05 | Discharge: 2025-01-05 | Disposition: A | Discharge status: Home / Self Care | Source: Ambulatory Visit | Attending: Cardiology | Admitting: Cardiology

## 2025-01-05 DIAGNOSIS — I10 Essential (primary) hypertension: Secondary | ICD-10-CM | POA: Diagnosis not present

## 2025-01-05 DIAGNOSIS — I2510 Atherosclerotic heart disease of native coronary artery without angina pectoris: Secondary | ICD-10-CM | POA: Diagnosis not present

## 2025-01-05 DIAGNOSIS — E785 Hyperlipidemia, unspecified: Secondary | ICD-10-CM | POA: Insufficient documentation

## 2025-01-05 DIAGNOSIS — R0609 Other forms of dyspnea: Secondary | ICD-10-CM | POA: Diagnosis present

## 2025-01-05 DIAGNOSIS — I080 Rheumatic disorders of both mitral and aortic valves: Secondary | ICD-10-CM | POA: Diagnosis not present

## 2025-01-05 LAB — ECHOCARDIOGRAM COMPLETE
AR max vel: 2.08 cm2
AV Area VTI: 2.42 cm2
AV Area mean vel: 2.17 cm2
AV Mean grad: 7 mmHg
AV Peak grad: 13.4 mmHg
AV Vena cont: 0.3 cm
Ao pk vel: 1.83 m/s
Area-P 1/2: 2.63 cm2
Calc EF: 62.2 %
MV M vel: 1.7 m/s
MV Peak grad: 11.6 mmHg
S' Lateral: 2.9 cm
Single Plane A2C EF: 60.7 %
Single Plane A4C EF: 63 %

## 2025-01-10 ENCOUNTER — Ambulatory Visit: Payer: Self-pay | Admitting: Cardiology

## 2025-01-21 ENCOUNTER — Telehealth: Payer: Self-pay

## 2025-01-21 NOTE — Telephone Encounter (Signed)
LVM per DPR- per Dr. Vanetta Shawl note regarding Echo results. Encouraged to call with any questions. Routed to PCP.

## 2025-01-25 ENCOUNTER — Ambulatory Visit: Admitting: Medical

## 2025-02-22 ENCOUNTER — Ambulatory Visit: Admitting: Medical

## 2025-10-04 ENCOUNTER — Ambulatory Visit
